# Patient Record
Sex: Female | Born: 1985 | Race: White | Hispanic: Yes | Marital: Single | Smoking: Never smoker
Health system: Southern US, Community
[De-identification: ages and names within clinical notes are randomized; demographics above are authoritative.]

## PROBLEM LIST (undated history)

## (undated) DIAGNOSIS — Z603 Acculturation difficulty: Secondary | ICD-10-CM

## (undated) DIAGNOSIS — E119 Type 2 diabetes mellitus without complications: Secondary | ICD-10-CM

## (undated) DIAGNOSIS — R0600 Dyspnea, unspecified: Secondary | ICD-10-CM

## (undated) DIAGNOSIS — R7303 Prediabetes: Secondary | ICD-10-CM

## (undated) DIAGNOSIS — F32A Depression, unspecified: Secondary | ICD-10-CM

## (undated) DIAGNOSIS — IMO0002 Reserved for concepts with insufficient information to code with codable children: Secondary | ICD-10-CM

## (undated) DIAGNOSIS — IMO0001 Reserved for inherently not codable concepts without codable children: Secondary | ICD-10-CM

## (undated) DIAGNOSIS — F329 Major depressive disorder, single episode, unspecified: Secondary | ICD-10-CM

## (undated) DIAGNOSIS — O1490 Unspecified pre-eclampsia, unspecified trimester: Secondary | ICD-10-CM

## (undated) DIAGNOSIS — E669 Obesity, unspecified: Secondary | ICD-10-CM

## (undated) DIAGNOSIS — K219 Gastro-esophageal reflux disease without esophagitis: Secondary | ICD-10-CM

## (undated) DIAGNOSIS — O2340 Unspecified infection of urinary tract in pregnancy, unspecified trimester: Secondary | ICD-10-CM

## (undated) DIAGNOSIS — R030 Elevated blood-pressure reading, without diagnosis of hypertension: Secondary | ICD-10-CM

## (undated) HISTORY — DX: Type 2 diabetes mellitus without complications: E11.9

## (undated) HISTORY — DX: Reserved for inherently not codable concepts without codable children: IMO0001

## (undated) HISTORY — PX: BRAIN BIOPSY: SHX905

## (undated) HISTORY — DX: Reserved for concepts with insufficient information to code with codable children: IMO0002

## (undated) HISTORY — DX: Major depressive disorder, single episode, unspecified: F32.9

## (undated) HISTORY — DX: Obesity, unspecified: E66.9

## (undated) HISTORY — DX: Acculturation difficulty: Z60.3

## (undated) HISTORY — DX: Elevated blood-pressure reading, without diagnosis of hypertension: R03.0

## (undated) HISTORY — DX: Prediabetes: R73.03

## (undated) HISTORY — DX: Depression, unspecified: F32.A

---
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
## 2007-03-03 ENCOUNTER — Emergency Department (HOSPITAL_COMMUNITY): Admission: EM | Admit: 2007-03-03 | Discharge: 2007-03-03 | Payer: Self-pay | Admitting: Emergency Medicine

## 2007-03-17 ENCOUNTER — Ambulatory Visit: Payer: Self-pay | Admitting: Obstetrics & Gynecology

## 2007-04-29 ENCOUNTER — Ambulatory Visit: Payer: Self-pay | Admitting: Obstetrics and Gynecology

## 2007-10-01 ENCOUNTER — Emergency Department (HOSPITAL_COMMUNITY): Admission: EM | Admit: 2007-10-01 | Discharge: 2007-10-01 | Payer: Self-pay | Admitting: Emergency Medicine

## 2009-07-10 ENCOUNTER — Inpatient Hospital Stay (HOSPITAL_COMMUNITY): Admission: AD | Admit: 2009-07-10 | Discharge: 2009-07-10 | Payer: Self-pay | Admitting: Obstetrics & Gynecology

## 2009-07-12 ENCOUNTER — Inpatient Hospital Stay (HOSPITAL_COMMUNITY): Admission: RE | Admit: 2009-07-12 | Discharge: 2009-07-12 | Payer: Self-pay | Admitting: Obstetrics & Gynecology

## 2009-08-20 ENCOUNTER — Inpatient Hospital Stay (HOSPITAL_COMMUNITY): Admission: AD | Admit: 2009-08-20 | Discharge: 2009-08-20 | Payer: Self-pay | Admitting: Obstetrics & Gynecology

## 2009-08-20 ENCOUNTER — Ambulatory Visit: Payer: Self-pay | Admitting: Obstetrics and Gynecology

## 2009-09-05 ENCOUNTER — Ambulatory Visit (HOSPITAL_COMMUNITY): Admission: RE | Admit: 2009-09-05 | Discharge: 2009-09-05 | Payer: Self-pay | Admitting: Family Medicine

## 2009-09-26 ENCOUNTER — Ambulatory Visit (HOSPITAL_COMMUNITY): Admission: RE | Admit: 2009-09-26 | Discharge: 2009-09-26 | Payer: Self-pay | Admitting: Family Medicine

## 2009-09-27 ENCOUNTER — Ambulatory Visit: Payer: Self-pay | Admitting: Advanced Practice Midwife

## 2009-09-27 ENCOUNTER — Inpatient Hospital Stay (HOSPITAL_COMMUNITY): Admission: AD | Admit: 2009-09-27 | Discharge: 2009-09-27 | Payer: Self-pay | Admitting: Obstetrics & Gynecology

## 2009-09-29 ENCOUNTER — Encounter: Payer: Self-pay | Admitting: Obstetrics & Gynecology

## 2009-09-29 LAB — CONVERTED CEMR LAB
Creatinine 24 HR UR: 849 mg/24hr (ref 700–1800)
Creatinine Clearance: 95 mL/min (ref 75–115)
Creatinine, Urine: 117.9 mg/dL
Protein, Ur: 526 mg/24hr — ABNORMAL HIGH (ref 50–100)

## 2009-10-02 ENCOUNTER — Encounter: Payer: Self-pay | Admitting: Family Medicine

## 2009-10-02 ENCOUNTER — Observation Stay (HOSPITAL_COMMUNITY): Admission: AD | Admit: 2009-10-02 | Discharge: 2009-10-03 | Payer: Self-pay | Admitting: Obstetrics & Gynecology

## 2009-10-04 ENCOUNTER — Ambulatory Visit: Payer: Self-pay | Admitting: Obstetrics & Gynecology

## 2009-10-04 ENCOUNTER — Encounter: Payer: Self-pay | Admitting: Obstetrics & Gynecology

## 2009-10-04 ENCOUNTER — Inpatient Hospital Stay (HOSPITAL_COMMUNITY): Admission: AD | Admit: 2009-10-04 | Discharge: 2009-10-12 | Payer: Self-pay | Admitting: Obstetrics & Gynecology

## 2009-10-04 ENCOUNTER — Encounter: Payer: Self-pay | Admitting: Family Medicine

## 2009-10-04 ENCOUNTER — Ambulatory Visit: Payer: Self-pay | Admitting: Surgery

## 2009-10-09 ENCOUNTER — Encounter: Payer: Self-pay | Admitting: Obstetrics & Gynecology

## 2009-10-17 ENCOUNTER — Ambulatory Visit: Payer: Self-pay | Admitting: Family

## 2009-10-17 ENCOUNTER — Inpatient Hospital Stay (HOSPITAL_COMMUNITY): Admission: AD | Admit: 2009-10-17 | Discharge: 2009-10-17 | Payer: Self-pay | Admitting: Obstetrics & Gynecology

## 2009-10-18 ENCOUNTER — Ambulatory Visit: Payer: Self-pay | Admitting: Family Medicine

## 2009-10-25 ENCOUNTER — Inpatient Hospital Stay (HOSPITAL_COMMUNITY): Admission: AD | Admit: 2009-10-25 | Discharge: 2009-10-26 | Payer: Self-pay | Admitting: Obstetrics & Gynecology

## 2009-10-31 ENCOUNTER — Inpatient Hospital Stay (HOSPITAL_COMMUNITY): Admission: AD | Admit: 2009-10-31 | Discharge: 2009-11-03 | Payer: Self-pay | Admitting: Obstetrics & Gynecology

## 2009-10-31 ENCOUNTER — Ambulatory Visit: Payer: Self-pay | Admitting: Obstetrics & Gynecology

## 2009-11-08 ENCOUNTER — Ambulatory Visit: Payer: Self-pay | Admitting: Obstetrics & Gynecology

## 2009-11-15 ENCOUNTER — Other Ambulatory Visit: Admission: RE | Admit: 2009-11-15 | Discharge: 2009-11-15 | Payer: Self-pay | Admitting: Obstetrics & Gynecology

## 2009-11-15 ENCOUNTER — Ambulatory Visit: Payer: Self-pay | Admitting: Family Medicine

## 2009-12-13 ENCOUNTER — Ambulatory Visit: Payer: Self-pay | Admitting: Obstetrics & Gynecology

## 2010-01-10 ENCOUNTER — Ambulatory Visit: Admission: RE | Admit: 2010-01-10 | Discharge: 2010-01-10 | Payer: Self-pay | Admitting: Gynecologic Oncology

## 2010-04-14 NOTE — L&D Delivery Note (Addendum)
Delivery Note At 11:37 AM a viable female was delivered via  (Presentation: right occiput anterior).  APGAR: 6, 9; weight 8 pounds 7 ounces .   Placenta status: spontaneous, intact.  Cord: 3 vessel cord  with the following complications: none .  Anesthesia: Epidural  Episiotomy: none Lacerations: none Est. Blood Loss (mL): 200 mL  Mom to postpartum.  Baby to nursery-stable.  Stacie Mitchell Stacie Mitchell 04/13/2011, 11:52 AM

## 2010-06-12 ENCOUNTER — Inpatient Hospital Stay (HOSPITAL_COMMUNITY)
Admission: AD | Admit: 2010-06-12 | Discharge: 2010-06-12 | Disposition: A | Discharge status: Home / Self Care | Payer: Self-pay | Source: Ambulatory Visit | Attending: Obstetrics & Gynecology | Admitting: Obstetrics & Gynecology

## 2010-06-12 DIAGNOSIS — B373 Candidiasis of vulva and vagina: Secondary | ICD-10-CM

## 2010-06-12 DIAGNOSIS — N949 Unspecified condition associated with female genital organs and menstrual cycle: Secondary | ICD-10-CM

## 2010-06-12 DIAGNOSIS — B3731 Acute candidiasis of vulva and vagina: Secondary | ICD-10-CM | POA: Insufficient documentation

## 2010-06-12 LAB — PREGNANCY, URINE: Preg Test, Ur: NEGATIVE

## 2010-06-12 LAB — WET PREP, GENITAL

## 2010-06-12 LAB — URINALYSIS, ROUTINE W REFLEX MICROSCOPIC
Hgb urine dipstick: NEGATIVE
Ketones, ur: NEGATIVE mg/dL
Nitrite: NEGATIVE
Protein, ur: NEGATIVE mg/dL
Urine Glucose, Fasting: NEGATIVE mg/dL

## 2010-06-12 LAB — URINE MICROSCOPIC-ADD ON

## 2010-06-13 LAB — GC/CHLAMYDIA PROBE AMP, GENITAL: Chlamydia, DNA Probe: NEGATIVE

## 2010-06-29 LAB — COMPREHENSIVE METABOLIC PANEL
Albumin: 3 g/dL — ABNORMAL LOW (ref 3.5–5.2)
Chloride: 105 mEq/L (ref 96–112)
GFR calc Af Amer: >60 mL/min (ref >60)
Glucose, Bld: 89 mg/dL (ref 70–99)
Potassium: 4.1 mEq/L (ref 3.5–5.1)
Sodium: 139 mEq/L (ref 135–145)
Total Protein: 6.8 g/dL (ref 6.0–8.3)

## 2010-06-29 LAB — CBC
HCT: 30.6 % — ABNORMAL LOW (ref 36.0–46.0)
MCH: 30.8 pg (ref 26.0–34.0)
MCHC: 34.1 g/dL (ref 30.0–36.0)
RDW: 14.1 % (ref 11.5–15.5)

## 2010-06-29 LAB — URINALYSIS, ROUTINE W REFLEX MICROSCOPIC
Glucose, UA: NEGATIVE mg/dL
Nitrite: NEGATIVE
Protein, ur: NEGATIVE mg/dL
Specific Gravity, Urine: >1.03 — ABNORMAL HIGH (ref 1.005–1.030)

## 2010-06-29 LAB — URINE MICROSCOPIC-ADD ON

## 2010-06-29 LAB — CREATININE, SERUM: GFR calc Af Amer: >60 mL/min (ref >60)

## 2010-06-30 LAB — COMPREHENSIVE METABOLIC PANEL
ALT: 26 U/L (ref 0–35)
ALT: 25 U/L (ref 0–35)
ALT: 25 U/L (ref 0–35)
ALT: 28 U/L (ref 0–35)
ALT: 29 U/L (ref 0–35)
ALT: 29 U/L (ref 0–35)
ALT: 32 U/L (ref 0–35)
ALT: 35 U/L (ref 0–35)
AST: 27 U/L (ref 0–37)
AST: 28 U/L (ref 0–37)
AST: 28 U/L (ref 0–37)
AST: 30 U/L (ref 0–37)
AST: 32 U/L (ref 0–37)
AST: 32 U/L (ref 0–37)
AST: 41 U/L — ABNORMAL HIGH (ref 0–37)
Albumin: 1.7 g/dL — ABNORMAL LOW (ref 3.5–5.2)
Albumin: 2.1 g/dL — ABNORMAL LOW (ref 3.5–5.2)
Albumin: 2.2 g/dL — ABNORMAL LOW (ref 3.5–5.2)
Albumin: 2.2 g/dL — ABNORMAL LOW (ref 3.5–5.2)
Albumin: 2.2 g/dL — ABNORMAL LOW (ref 3.5–5.2)
Albumin: 2.3 g/dL — ABNORMAL LOW (ref 3.5–5.2)
Albumin: 2.4 g/dL — ABNORMAL LOW (ref 3.5–5.2)
Albumin: 2.5 g/dL — ABNORMAL LOW (ref 3.5–5.2)
Alkaline Phosphatase: 60 U/L (ref 39–117)
Alkaline Phosphatase: 108 U/L (ref 39–117)
Alkaline Phosphatase: 110 U/L (ref 39–117)
Alkaline Phosphatase: 124 U/L — ABNORMAL HIGH (ref 39–117)
BUN: 10 mg/dL (ref 6–23)
BUN: 11 mg/dL (ref 6–23)
BUN: 12 mg/dL (ref 6–23)
BUN: 6 mg/dL (ref 6–23)
BUN: 8 mg/dL (ref 6–23)
BUN: 9 mg/dL (ref 6–23)
CO2: 25 mEq/L (ref 19–32)
CO2: 24 mEq/L (ref 19–32)
CO2: 24 mEq/L (ref 19–32)
CO2: 24 mEq/L (ref 19–32)
CO2: 25 mEq/L (ref 19–32)
CO2: 25 mEq/L (ref 19–32)
CO2: 25 mEq/L (ref 19–32)
CO2: 26 mEq/L (ref 19–32)
Calcium: 7.4 mg/dL — ABNORMAL LOW (ref 8.4–10.5)
Calcium: 7.4 mg/dL — ABNORMAL LOW (ref 8.4–10.5)
Calcium: 7.6 mg/dL — ABNORMAL LOW (ref 8.4–10.5)
Calcium: 7.9 mg/dL — ABNORMAL LOW (ref 8.4–10.5)
Calcium: 7.9 mg/dL — ABNORMAL LOW (ref 8.4–10.5)
Calcium: 8.5 mg/dL (ref 8.4–10.5)
Calcium: 8.8 mg/dL (ref 8.4–10.5)
Chloride: 107 mEq/L (ref 96–112)
Chloride: 104 mEq/L (ref 96–112)
Chloride: 105 mEq/L (ref 96–112)
Chloride: 105 mEq/L (ref 96–112)
Chloride: 106 mEq/L (ref 96–112)
Chloride: 108 mEq/L (ref 96–112)
Chloride: 109 mEq/L (ref 96–112)
Creatinine, Ser: 0.57 mg/dL (ref 0.4–1.2)
Creatinine, Ser: 0.61 mg/dL (ref 0.4–1.2)
Creatinine, Ser: 0.61 mg/dL (ref 0.4–1.2)
Creatinine, Ser: 0.62 mg/dL (ref 0.4–1.2)
Creatinine, Ser: 0.63 mg/dL (ref 0.4–1.2)
Creatinine, Ser: 0.7 mg/dL (ref 0.4–1.2)
Creatinine, Ser: 0.83 mg/dL (ref 0.4–1.2)
GFR calc Af Amer: >60 mL/min (ref >60)
GFR calc Af Amer: >60 mL/min (ref >60)
GFR calc Af Amer: >60 mL/min (ref >60)
GFR calc Af Amer: >60 mL/min (ref >60)
GFR calc Af Amer: >60 mL/min (ref >60)
GFR calc Af Amer: >60 mL/min (ref >60)
GFR calc Af Amer: >60 mL/min (ref >60)
GFR calc Af Amer: >60 mL/min (ref >60)
GFR calc non Af Amer: >60 mL/min (ref >60)
GFR calc non Af Amer: >60 mL/min (ref >60)
GFR calc non Af Amer: >60 mL/min (ref >60)
GFR calc non Af Amer: >60 mL/min (ref >60)
GFR calc non Af Amer: >60 mL/min (ref >60)
Glucose, Bld: 98 mg/dL (ref 70–99)
Glucose, Bld: 109 mg/dL — ABNORMAL HIGH (ref 70–99)
Glucose, Bld: 78 mg/dL (ref 70–99)
Glucose, Bld: 79 mg/dL (ref 70–99)
Glucose, Bld: 88 mg/dL (ref 70–99)
Potassium: 3.8 mEq/L (ref 3.5–5.1)
Potassium: 3.7 mEq/L (ref 3.5–5.1)
Potassium: 3.8 mEq/L (ref 3.5–5.1)
Potassium: 4 mEq/L (ref 3.5–5.1)
Potassium: 4 mEq/L (ref 3.5–5.1)
Potassium: 4.5 mEq/L (ref 3.5–5.1)
Sodium: 137 mEq/L (ref 135–145)
Sodium: 133 mEq/L — ABNORMAL LOW (ref 135–145)
Sodium: 133 mEq/L — ABNORMAL LOW (ref 135–145)
Sodium: 135 mEq/L (ref 135–145)
Sodium: 135 mEq/L (ref 135–145)
Sodium: 138 mEq/L (ref 135–145)
Total Bilirubin: 0.2 mg/dL — ABNORMAL LOW (ref 0.3–1.2)
Total Bilirubin: 0.3 mg/dL (ref 0.3–1.2)
Total Bilirubin: 0.3 mg/dL (ref 0.3–1.2)
Total Bilirubin: 0.3 mg/dL (ref 0.3–1.2)
Total Protein: 5.6 g/dL — ABNORMAL LOW (ref 6.0–8.3)
Total Protein: 4.3 g/dL — ABNORMAL LOW (ref 6.0–8.3)
Total Protein: 5 g/dL — ABNORMAL LOW (ref 6.0–8.3)
Total Protein: 5.2 g/dL — ABNORMAL LOW (ref 6.0–8.3)
Total Protein: 5.2 g/dL — ABNORMAL LOW (ref 6.0–8.3)
Total Protein: 5.4 g/dL — ABNORMAL LOW (ref 6.0–8.3)

## 2010-06-30 LAB — CBC
HCT: 23 % — ABNORMAL LOW (ref 36.0–46.0)
HCT: 24.1 % — ABNORMAL LOW (ref 36.0–46.0)
HCT: 36.8 % (ref 36.0–46.0)
HCT: 37.7 % (ref 36.0–46.0)
HCT: 39.1 % (ref 36.0–46.0)
Hemoglobin: 7.8 g/dL — ABNORMAL LOW (ref 12.0–15.0)
Hemoglobin: 8.4 g/dL — ABNORMAL LOW (ref 12.0–15.0)
Hemoglobin: 10.5 g/dL — ABNORMAL LOW (ref 12.0–15.0)
Hemoglobin: 12.6 g/dL (ref 12.0–15.0)
Hemoglobin: 12.6 g/dL (ref 12.0–15.0)
Hemoglobin: 13.4 g/dL (ref 12.0–15.0)
Hemoglobin: 13.6 g/dL (ref 12.0–15.0)
Hemoglobin: 13.6 g/dL (ref 12.0–15.0)
Hemoglobin: 9.7 g/dL — ABNORMAL LOW (ref 12.0–15.0)
MCH: 33 pg (ref 26.0–34.0)
MCH: 32 pg (ref 26.0–34.0)
MCH: 32 pg (ref 26.0–34.0)
MCH: 32.1 pg (ref 26.0–34.0)
MCH: 32.1 pg (ref 26.0–34.0)
MCH: 32.1 pg (ref 26.0–34.0)
MCH: 32.2 pg (ref 26.0–34.0)
MCH: 32.2 pg (ref 26.0–34.0)
MCH: 33.6 pg (ref 26.0–34.0)
MCH: 34 pg (ref 26.0–34.0)
MCHC: 34.9 g/dL (ref 30.0–36.0)
MCHC: 34.1 g/dL (ref 30.0–36.0)
MCHC: 34.1 g/dL (ref 30.0–36.0)
MCHC: 34.2 g/dL (ref 30.0–36.0)
MCHC: 34.2 g/dL (ref 30.0–36.0)
MCHC: 34.3 g/dL (ref 30.0–36.0)
MCHC: 34.7 g/dL (ref 30.0–36.0)
MCHC: 34.7 g/dL (ref 30.0–36.0)
MCHC: 35.1 g/dL (ref 30.0–36.0)
MCHC: 35.8 g/dL (ref 30.0–36.0)
MCV: 93.2 fL (ref 78.0–100.0)
MCV: 93.3 fL (ref 78.0–100.0)
MCV: 93.4 fL (ref 78.0–100.0)
MCV: 93.5 fL (ref 78.0–100.0)
MCV: 93.9 fL (ref 78.0–100.0)
MCV: 94.1 fL (ref 78.0–100.0)
MCV: 94.1 fL (ref 78.0–100.0)
MCV: 94.2 fL (ref 78.0–100.0)
Platelets: 183 10*3/uL (ref 150–400)
Platelets: 192 10*3/uL (ref 150–400)
Platelets: 194 10*3/uL (ref 150–400)
Platelets: 197 10*3/uL (ref 150–400)
Platelets: 198 10*3/uL (ref 150–400)
Platelets: 207 10*3/uL (ref 150–400)
Platelets: 209 10*3/uL (ref 150–400)
RBC: 2.48 MIL/uL — ABNORMAL LOW (ref 3.87–5.11)
RBC: 2.85 MIL/uL — ABNORMAL LOW (ref 3.87–5.11)
RBC: 3.26 MIL/uL — ABNORMAL LOW (ref 3.87–5.11)
RBC: 3.93 MIL/uL (ref 3.87–5.11)
RBC: 3.99 MIL/uL (ref 3.87–5.11)
RBC: 4.19 MIL/uL (ref 3.87–5.11)
RBC: 4.23 MIL/uL (ref 3.87–5.11)
RBC: 4.28 MIL/uL (ref 3.87–5.11)
RDW: 13.3 % (ref 11.5–15.5)
RDW: 13.6 % (ref 11.5–15.5)
RDW: 13.6 % (ref 11.5–15.5)
RDW: 13.7 % (ref 11.5–15.5)
RDW: 13.9 % (ref 11.5–15.5)
RDW: 13.9 % (ref 11.5–15.5)
RDW: 13.9 % (ref 11.5–15.5)
WBC: 10.8 10*3/uL — ABNORMAL HIGH (ref 4.0–10.5)
WBC: 10.3 10*3/uL (ref 4.0–10.5)
WBC: 7.3 10*3/uL (ref 4.0–10.5)
WBC: 9.7 10*3/uL (ref 4.0–10.5)
WBC: 9.8 10*3/uL (ref 4.0–10.5)
WBC: 9.8 10*3/uL (ref 4.0–10.5)

## 2010-06-30 LAB — URINALYSIS, ROUTINE W REFLEX MICROSCOPIC
Bilirubin Urine: NEGATIVE
Bilirubin Urine: NEGATIVE
Glucose, UA: NEGATIVE mg/dL
Ketones, ur: NEGATIVE mg/dL
Nitrite: NEGATIVE
Protein, ur: NEGATIVE mg/dL
Specific Gravity, Urine: <1.005 — ABNORMAL LOW (ref 1.005–1.030)
Specific Gravity, Urine: >1.03 — ABNORMAL HIGH (ref 1.005–1.030)
Urobilinogen, UA: 0.2 mg/dL (ref 0.0–1.0)
Urobilinogen, UA: 0.2 mg/dL (ref 0.0–1.0)
pH: 6 (ref 5.0–8.0)

## 2010-06-30 LAB — DIFFERENTIAL
Basophils Relative: 0 % (ref 0–1)
Eosinophils Absolute: 0 10*3/uL (ref 0.0–0.7)
Monocytes Absolute: 1 10*3/uL (ref 0.1–1.0)
Monocytes Relative: 5 % (ref 3–12)
Neutro Abs: 18.6 10*3/uL — ABNORMAL HIGH (ref 1.7–7.7)

## 2010-06-30 LAB — CREATININE CLEARANCE, URINE, 24 HOUR
Collection Interval-CRCL: 24 hours
Creatinine Clearance: 206 mL/min — ABNORMAL HIGH (ref 75–115)
Creatinine, Urine: 54.3 mg/dL
Creatinine, Urine: 93.7 mg/dL
Creatinine: 0.52 mg/dL (ref 0.4–1.2)

## 2010-06-30 LAB — PROTEIN, URINE, 24 HOUR
Collection Interval-UPROT: 24 hours
Protein, 24H Urine: 2795 mg/d — ABNORMAL HIGH (ref 50–100)
Protein, 24H Urine: 3540 mg/d — ABNORMAL HIGH (ref 50–100)
Protein, Urine: 81 mg/dL
Urine Total Volume-UPROT: 3450 mL

## 2010-06-30 LAB — PROTEIN / CREATININE RATIO, URINE
Protein Creatinine Ratio: 0.95 — ABNORMAL HIGH (ref 0.00–0.15)
Total Protein, Urine: 38 mg/dL

## 2010-06-30 LAB — POCT URINALYSIS DIP (DEVICE)
Glucose, UA: NEGATIVE mg/dL
Nitrite: NEGATIVE
Protein, ur: >=300 mg/dL — AB
Urobilinogen, UA: 1 mg/dL (ref 0.0–1.0)

## 2010-06-30 LAB — URINE MICROSCOPIC-ADD ON

## 2010-06-30 LAB — URIC ACID: Uric Acid, Serum: 5.1 mg/dL (ref 2.4–7.0)

## 2010-06-30 LAB — PROTIME-INR: INR: 1.12 (ref 0.00–1.49)

## 2010-06-30 LAB — MAGNESIUM
Magnesium: 4.3 mg/dL — ABNORMAL HIGH (ref 1.5–2.5)
Magnesium: 4.4 mg/dL — ABNORMAL HIGH (ref 1.5–2.5)

## 2010-07-02 LAB — URINALYSIS, ROUTINE W REFLEX MICROSCOPIC
Nitrite: NEGATIVE
Protein, ur: NEGATIVE mg/dL
Urobilinogen, UA: 0.2 mg/dL (ref 0.0–1.0)

## 2010-07-02 LAB — WET PREP, GENITAL: Trich, Wet Prep: NONE SEEN

## 2010-07-02 LAB — CBC
Hemoglobin: 12.3 g/dL (ref 12.0–15.0)
MCHC: 34.4 g/dL (ref 30.0–36.0)
MCV: 91.6 fL (ref 78.0–100.0)
RBC: 3.89 MIL/uL (ref 3.87–5.11)

## 2010-07-02 LAB — RAPID URINE DRUG SCREEN, HOSP PERFORMED: Barbiturates: NOT DETECTED

## 2010-07-02 LAB — GC/CHLAMYDIA PROBE AMP, GENITAL
Chlamydia, DNA Probe: NEGATIVE
GC Probe Amp, Genital: NEGATIVE

## 2010-07-02 LAB — ABO/RH: ABO/RH(D): AB POS

## 2010-07-02 LAB — RAPID HIV SCREEN (WH-MAU): Rapid HIV Screen: NONREACTIVE

## 2010-07-08 LAB — POCT PREGNANCY, URINE: Preg Test, Ur: POSITIVE

## 2010-08-27 NOTE — Group Therapy Note (Signed)
NAME:  KYAN, GIANNONE NO.:  [id number]   MEDICAL RECORD NO.:  [id number]          PATIENT TYPE:  WOC   LOCATION:  [facility]                   FACILITY:  [facility]   PHYSICIAN:  Argentina Donovan, MD        DATE OF BIRTH:  [birth year]   DATE OF SERVICE:  04/29/2007                                  CLINIC NOTE   REASON FOR VISIT:  The patient is a 25 year old Hispanic Spanish-  speaking female, nulligravida, with morbid obesity i.e., weight 342  pounds at 5 feet 3 inches tall. Returns today for a follow-up after  having seen Dr. Marice Potter and Dr. Penne Lash and noted to have for a suburethral  mass. She was placed on antibiotics and today there was no sign of a  mass when we examined her. However, she does definitely have bacterial  vaginosis for which we will put her on Flagyl. In addition to that she  complained of a small lump on the mons on examination. This was a small  sebaceous cyst. The patient stated that her period was very scant this  month and is usually heavy. We told her to wait, that if she starts  having irregular periods that  she should come and see [place]. But at this  point they been very regular. She does however have some signs of  hirsutism on the lower abdomen and on the face.   IMPRESSION:  Bacterial vaginosis, possible polycystic ovarian syndrome.           ______________________________  Argentina Donovan, MD     PR/MEDQ  D:  04/29/2007  T:  04/29/2007  Job:  [id number]

## 2010-08-27 NOTE — Group Therapy Note (Signed)
NAME:  Stacie Mitchell, Stacie Mitchell NO.:  [id number]   MEDICAL RECORD NO.:  [id number]          PATIENT TYPE:  WOC   LOCATION:  [facility]                   FACILITY:  [facility]   PHYSICIAN:  Dorthula Perfect, MD     DATE OF BIRTH:  06/18/1985   DATE OF SERVICE:                                  CLINIC NOTE   This 25 year old Hispanic female, nulligravida, returns for followup  after having a vaginal hematoma drained in the emergency room.  Review  of the hospital note does not show me any specifics about the size and  location of his hematoma.  The patient states that it is a little better  but is still very tender.   Last menstrual period was started about six days ago.  She does nothing  for contraception.  She has never had a Pap smear.   PHYSICAL EXAMINATION:  Height 5 feet 3.  Weight 342 pounds.  Blood  pressure 115/79.  Exam is limited to external genitalia.  Examination of external  genitalia reveals the vulva to basically be normal.  Adjacent to the  left side of the urethra is a soft enlargement measuring about 2 cm x  1.5 cm.  It is not firm, tent, or fluctuant.  It is tender.  I wonder if  it is related to the urethra.   Since she has never had a Pap smear, I used the Patterson speculum but  was unable to reach the cervix because of redundancy in the vaginal  walls and the fact that trying to use a larger speculum was  uncomfortable for her because of this cystic-like structure.  I had Dr.  Marice Potter look at it also.  We both wonder if it is related to the urethra,  i.e., a urethral diverticulum, but it is not in the midline but is on  the right side.   ASSESSMENT:  Paraurethral cyst-like structure.   DISPOSITION:  Keflex 500 mg 4 times a day for 10 days.  She will return  in about four weeks to see Dr. Marice Potter.  If this is infectious, perhaps the  antibiotic will help.  If not, consideration will be whether she needs  to be checked by a urologist or whether this area needs  to be surgically  explored.           ______________________________  Dorthula Perfect, MD     ER/MEDQ  D:  03/17/2007  T:  03/17/2007  Job:  [id number]

## 2010-08-27 NOTE — Assessment & Plan Note (Signed)
**Note Stacie-Identified via Obfuscation** NAME:  Stacie Mitchell, Stacie Mitchell            ACCOUNT NO.:  [id number]   MEDICAL RECORD NO.:  [id number]          PATIENT TYPE:  WOC   LOCATION:  [facility]         FACILITY:  [facility]   PHYSICIAN:  Caren Griffins, CNM       DATE OF BIRTH:  [birth year]   DATE OF SERVICE:  11/15/2009                                  CLINIC NOTE   This is a G1, P1-0-0-1 who had a C-section on October 09, 2009 by Dr.  Penne Lash at 36-6/7 weeks.  She had complication of preeclampsia,  prolonged rupture of membranes, and morbid obesity 385 pounds.  She was  last seen here by Dr. Okey Dupre on November 08, 2009, for check of her wound  cellulitis.  He had previously seen her and felt that she was improving  and her wound vac was out and she did have much less induration then at  previous visit when he evaluated her.  She is still taking Bactrim  finishing a 10-day course and using silver sulfadiazine cream b.i.d. to  the small area where the skin is open.  Of note, she was admitted July  2011, for 3 days and received IV vancomycin for her postop wound  infection.  She wants to have her postpartum check today.  However,  after this was in progress, she is deciding that she would like to have  Implanon which is done at the health department.  She has not been  sexually active and does not have a sexual partner at this time.  Her  infant is thriving.  She weaned him the first week and is now bottle  feeding.  She had complained of dizziness at her last visit with Dr.  Okey Dupre and he was planning to refer her to see an internist.  However, she  tells me now that she has had no more dizziness and that she related it  which is going without eating for long periods of time.  She did that  for her postnatal depression scale and is not significantly depressed.  She states she has help with the baby.  She is having no bleeding and  has a thin yellow discharge and she denies any pain.  Of note, she had a  Pap smear that was negative in June  2011, so she is not due for her Pap.  For complete history, please see the note that was just done by Dr.  Debroah Loop on July 2011.  She has had no changes to her history other than  as stated above.   OBJECTIVE:  GENERAL:  Obese WF in NAD.  VITAL SIGNS:  Temperature 98.8, BP 103/64.  Her weight is not repeated,  it was 323 at her last visit on November 08, 2009.  Thyroid smooth.  No  enlargement noted.  Coronary RRR without murmur.  LUNGS:  CTA bilateral.  BREASTS:  No discrete masses.  Nipples erect.  No drainage.  No  lymphadenopathy.  ABDOMEN:  Morbidly obese, apron wound dressing over Pfannenstiel  incision is removed.  The incision is well-healed.  There is a 1.5-cm  open cutaneous round area centrally.  This is cleansed and there is  actually no  drainage.  No bleeding and no skin redness; however, she  does still have significant induration superior to the wound extending 5-  10 cm centrally.  She feels this is much improved.  EXTREMITIES:  Moderate dependent edema.  Pulse is full and equal  bilaterally.   PROCEDURE NOTE:  The patient states that she had a lesion that she was  told was a condylomata wart near her introitus and she would like to  have this removed.  She states that it irritates her and itches and  burns at times.   OBJECTIVE:  There is indeed about a 4-mm fleshy pedunculated lesion  which appears to be a nevus at this inferior left labium majora.  In  consultation with Dr. Jolayne Panther, the area is cleansed with Betadine under  sterile technique.  The tiny peduncle is not at off with 3'o suture very  tightly.  After that, the lesion is grasped and clipped at the stalk.  This is sent to Pathology.  Silver nitrate stick used to control  bleeding at site which was very small amount.  The patient tolerated the  procedure very well.  Following this, a bimanual is done.  Cervix is  closed.  There is physiologic discharge and no bleeding.  Unable to  outline the contour or size  of the uterus due to body habitus.   ASSESSMENT:  1. Six-week postop check.  Wound cellulitis is much improved and she      is advised to keep the small open portion clean and continue either      a Band-Aid or gauge apron dressing to put between that in her      pannus.  She is to complete her course of Bactrim.  2. Morbid obesity.  Discussed the need for weight loss and its effect      on her general health status.  She does not seem motivated to diet      at this time.  She is given a referral to [facility] to get Implanon.  In the      meanwhile, she is advised to continue abstinence or use condoms.           ______________________________  Caren Griffins, CNM     DP/MEDQ  D:  11/15/2009  T:  11/16/2009  Job:  [id number]

## 2010-09-26 ENCOUNTER — Inpatient Hospital Stay (HOSPITAL_COMMUNITY): Payer: Self-pay

## 2010-09-26 ENCOUNTER — Inpatient Hospital Stay (HOSPITAL_COMMUNITY)
Admission: AD | Admit: 2010-09-26 | Discharge: 2010-09-26 | Disposition: A | Discharge status: Home / Self Care | Payer: Self-pay | Source: Ambulatory Visit | Attending: Obstetrics & Gynecology | Admitting: Obstetrics & Gynecology

## 2010-09-26 DIAGNOSIS — R109 Unspecified abdominal pain: Secondary | ICD-10-CM | POA: Insufficient documentation

## 2010-09-26 DIAGNOSIS — Y92009 Unspecified place in unspecified non-institutional (private) residence as the place of occurrence of the external cause: Secondary | ICD-10-CM | POA: Insufficient documentation

## 2010-09-26 DIAGNOSIS — O9989 Other specified diseases and conditions complicating pregnancy, childbirth and the puerperium: Secondary | ICD-10-CM

## 2010-09-26 DIAGNOSIS — O99891 Other specified diseases and conditions complicating pregnancy: Secondary | ICD-10-CM | POA: Insufficient documentation

## 2010-09-26 DIAGNOSIS — W010XXA Fall on same level from slipping, tripping and stumbling without subsequent striking against object, initial encounter: Secondary | ICD-10-CM | POA: Insufficient documentation

## 2010-09-26 LAB — URINALYSIS, ROUTINE W REFLEX MICROSCOPIC
Hgb urine dipstick: NEGATIVE
Leukocytes, UA: NEGATIVE
Nitrite: NEGATIVE
Protein, ur: NEGATIVE mg/dL
Specific Gravity, Urine: >1.03 — ABNORMAL HIGH (ref 1.005–1.030)
Urobilinogen, UA: 0.2 mg/dL (ref 0.0–1.0)

## 2010-09-26 LAB — WET PREP, GENITAL
Clue Cells Wet Prep HPF POC: NONE SEEN
Trich, Wet Prep: NONE SEEN

## 2010-09-26 LAB — CBC
HCT: 35.8 % — ABNORMAL LOW (ref 36.0–46.0)
MCHC: 34.9 g/dL (ref 30.0–36.0)
Platelets: 245 10*3/uL (ref 150–400)
RDW: 13.3 % (ref 11.5–15.5)
WBC: 9.2 10*3/uL (ref 4.0–10.5)

## 2010-09-26 LAB — DIFFERENTIAL
Basophils Absolute: 0 10*3/uL (ref 0.0–0.1)
Basophils Relative: 0 % (ref 0–1)
Eosinophils Relative: 1 % (ref 0–5)
Lymphocytes Relative: 24 % (ref 12–46)

## 2010-11-30 ENCOUNTER — Inpatient Hospital Stay (HOSPITAL_COMMUNITY)
Admission: AD | Admit: 2010-11-30 | Discharge: 2010-11-30 | Disposition: A | Discharge status: Home / Self Care | Payer: Self-pay | Source: Ambulatory Visit | Attending: Obstetrics & Gynecology | Admitting: Obstetrics & Gynecology

## 2010-11-30 ENCOUNTER — Encounter (HOSPITAL_COMMUNITY): Payer: Self-pay

## 2010-11-30 DIAGNOSIS — R109 Unspecified abdominal pain: Secondary | ICD-10-CM | POA: Insufficient documentation

## 2010-11-30 DIAGNOSIS — O99891 Other specified diseases and conditions complicating pregnancy: Secondary | ICD-10-CM | POA: Insufficient documentation

## 2010-11-30 HISTORY — DX: Unspecified pre-eclampsia, unspecified trimester: O14.90

## 2010-11-30 LAB — CBC
HCT: 35.8 % — ABNORMAL LOW (ref 36.0–46.0)
MCHC: 34.6 g/dL (ref 30.0–36.0)
Platelets: 250 10*3/uL (ref 150–400)
RDW: 13.5 % (ref 11.5–15.5)
WBC: 11.4 10*3/uL — ABNORMAL HIGH (ref 4.0–10.5)

## 2010-11-30 LAB — URINALYSIS, ROUTINE W REFLEX MICROSCOPIC
Ketones, ur: 15 mg/dL — AB
Leukocytes, UA: NEGATIVE
Nitrite: NEGATIVE
Protein, ur: NEGATIVE mg/dL
Urobilinogen, UA: 1 mg/dL (ref 0.0–1.0)

## 2010-11-30 LAB — WET PREP, GENITAL: Trich, Wet Prep: NONE SEEN

## 2010-11-30 MED ORDER — METRONIDAZOLE 500 MG PO TABS: 500.0000 mg | ORAL_TABLET | Freq: Two times a day (BID) | ORAL | Status: AC

## 2010-11-30 MED ORDER — CYCLOBENZAPRINE HCL 10 MG PO TABS: 10.0000 mg | ORAL_TABLET | Freq: Three times a day (TID) | ORAL | Status: DC | PRN

## 2010-11-30 MED ORDER — ACETAMINOPHEN 325 MG PO TABS: 650.0000 mg | ORAL_TABLET | Freq: Four times a day (QID) | ORAL | Status: DC | PRN

## 2010-11-30 NOTE — ED Provider Notes (Signed)
Chief Complaint:  Abdominal Pain and Vaginal Bleeding   Stacie Mitchell is  25 y.o. G2P1001 ar 22wks, [facility] by the health department who presents with 2 days of abdominal and back pain.  Pt reports lifting heavy objects yesterday and began to have sharp pain in her abdomen around her umbilicus and pelvic area and her low back.  She also notes some scant bleeding that has since resolved. She reports +FM, no contractions.  Notes some vaginal discharge    Obstetrical/Gynecological History: OB History    Grav Para Term Preterm Abortions TAB SAB Ect Mult Living   2 1 1       1       Past Medical History: Past Medical History  Diagnosis Date  . Preeclampsia     Past Surgical History: Past Surgical History  Procedure Date  . 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    Family History: No family history on file.  Social History: History  Substance Use Topics  . Smoking status: Never Smoker   . Smokeless tobacco: Not on file  . Alcohol Use: No    Allergies: No Known Allergies  Prescriptions prior to admission  Medication Sig Dispense Refill  . Prenatal Vit-Fe Fumarate-FA (PRENATAL PLUS) 65-1 MG TABS Take 2 tablets by mouth daily.          Review of Systems - Negative except listed above in HPI  Physical Exam   Blood pressure 112/53, pulse 101, temperature 97.5 F (36.4 C), temperature source Oral, resp. rate 18, height 5\' 1"  (1.549 m), weight 331 lb 8 oz (150.367 kg), SpO2 96.00%.  General: General appearance - alert, well appearing, and in no distress Abdomen - morbidly obese, TTP around umbilicus, soft +BS, unable to assess fundal height Pelvic - SSE showed clear-white discharge, no bleeding visualized, cervix closed/thick /high Extremities - peripheral pulses normal, no pedal edema, no clubbing or cyanosis Skin - normal coloration and turgor, no rashes, no suspicious skin lesions noted  ]Bedside ultrasound showed +FM, with adequate  FHT  Labs: Recent Results (from the past 24 hour(s))  URINALYSIS, ROUTINE W REFLEX MICROSCOPIC   Collection Time   11/30/10  3:45 AM      Component Value Range   Color, Urine YELLOW  YELLOW    Appearance CLEAR  CLEAR    Specific Gravity, Urine >1.030 (*) 1.005 - 1.030    pH 5.5  5.0 - 8.0    Glucose, UA NEGATIVE  NEGATIVE (mg/dL)   Hgb urine dipstick NEGATIVE  NEGATIVE    Bilirubin Urine NEGATIVE  NEGATIVE    Ketones, ur 15 (*) NEGATIVE (mg/dL)   Protein, ur NEGATIVE  NEGATIVE (mg/dL)   Urobilinogen, UA 1.0  0.0 - 1.0 (mg/dL)   Nitrite NEGATIVE  NEGATIVE    Leukocytes, UA NEGATIVE  NEGATIVE   CBC   Collection Time   11/30/10  4:14 AM      Component Value Range   WBC 11.4 (*) 4.0 - 10.5 (K/uL)   RBC 4.02  3.87 - 5.11 (MIL/uL)   Hemoglobin 12.4  12.0 - 15.0 (g/dL)   HCT 16.1 (*) 09.6 - 46.0 (%)   MCV 89.1  78.0 - 100.0 (fL)   MCH 30.8  26.0 - 34.0 (pg)   MCHC 34.6  30.0 - 36.0 (g/dL)   RDW 04.5  40.9 - 81.1 (%)   Platelets 250  150 - 400 (K/uL)   Imaging Studies:  No results found.  Assessment/Plan: 25yo G2P1001 with abdominal pain -Check wet prep and GC/chlamydia, no signs of active bleeding, CBC wnl.  Pain consistent with MSK etiology.  Patient may take tylenol for pain and follow up with HD.   -UA showing signs of dehydration. Encouraged pt to push PO fluids, may be contributing to abdominal cramping  -wet prep showed clue cells, will start patient on 7d of flagyl 500mg  BID  Lindaann Slough MD  I have discussed this case with [name] CNM is in agreement with this plan.

## 2010-11-30 NOTE — Progress Notes (Signed)
Patient presents with c/o vaginal spotting that started yesterday and started having lower abdominal pain after lifting a heavy box. She states that the pain is now radiating to her back.

## 2010-12-02 LAB — GC/CHLAMYDIA PROBE AMP, GENITAL: Chlamydia, DNA Probe: NEGATIVE

## 2010-12-06 IMAGING — US US OB FOLLOW-UP
2 series · 14 of 28 positions shown · non-contrast
Comparison: none

OBSTETRICAL ULTRASOUND:
 This ultrasound exam was performed in the [HOSPITAL] Ultrasound Department.  The OB US report was generated in the AS system, and faxed to the ordering physician.  This report is also available in [HOSPITAL]?s [employer] and in [REDACTED] PACS.

[Series 1: us ob follow up · 2 of 7 slices shown (1 of 2)]
[im 3/7]
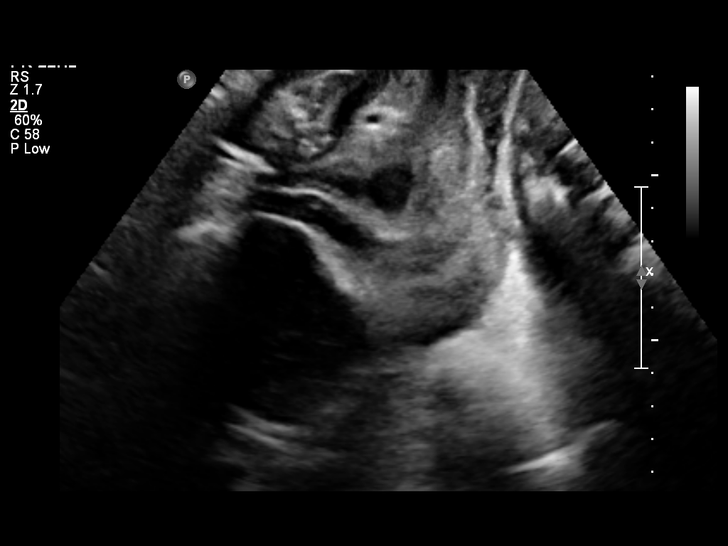
[im 7/7]
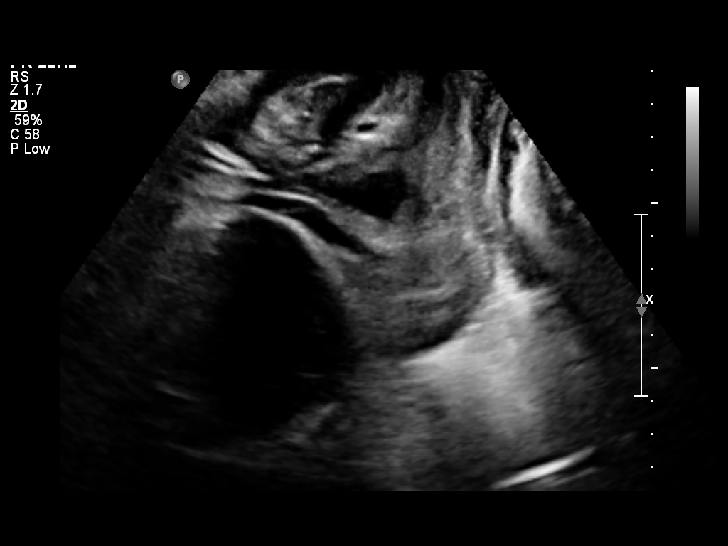

[Series 1: us ob follow up · 44 acquisitions, 12 frames shown (2 of 2)]
[im 2/44]
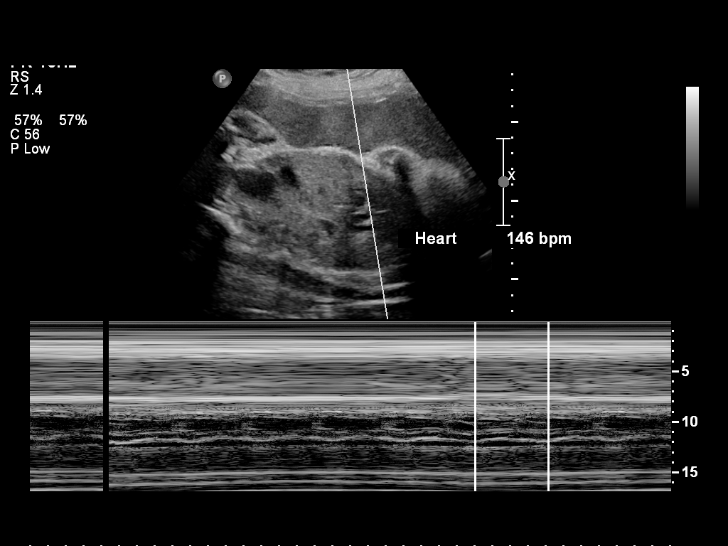
[im 6/44]
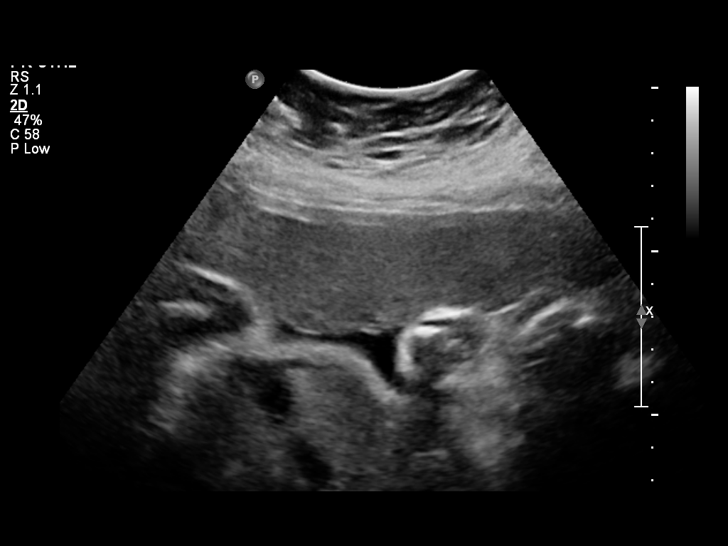
[im 10/44]
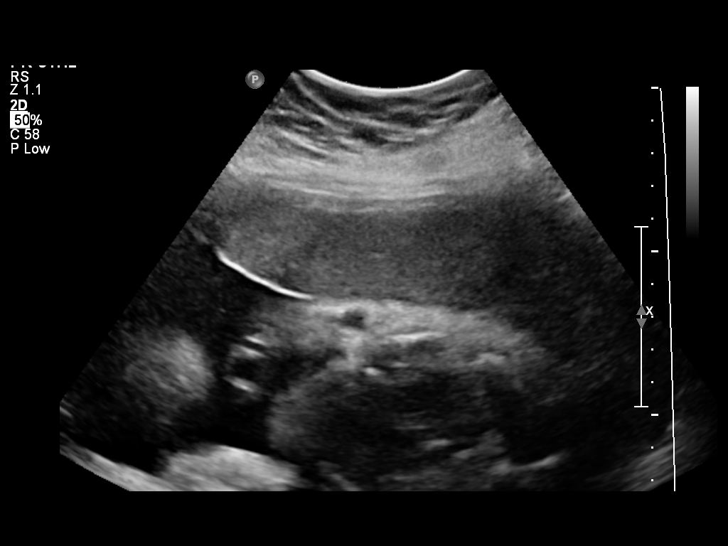
[im 14/44]
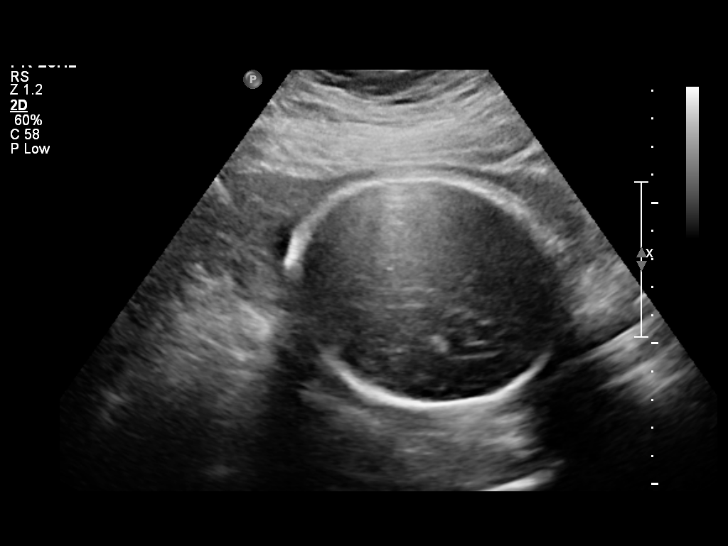
[im 17/44]
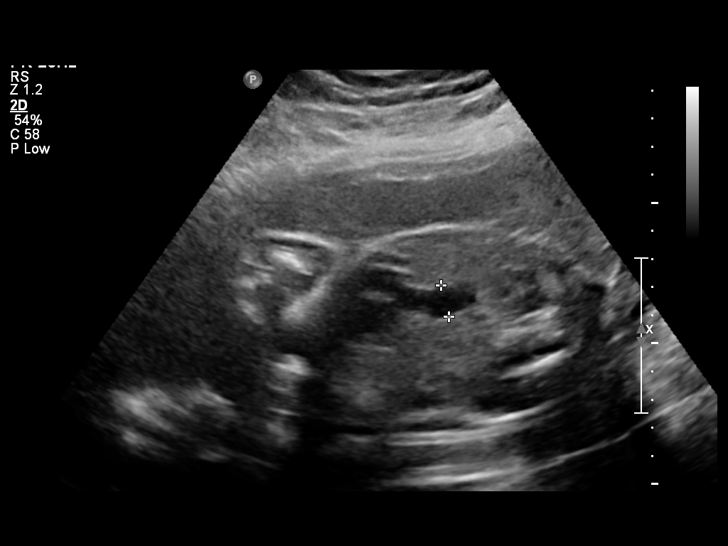
[im 21/44]
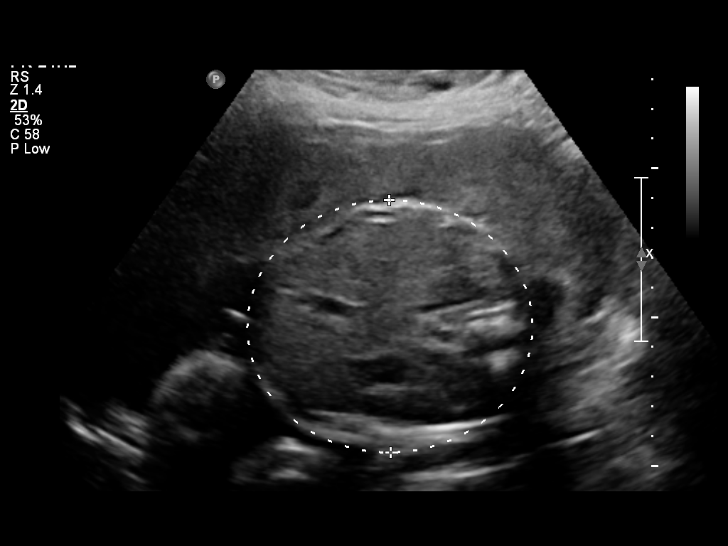
[im 25/44]
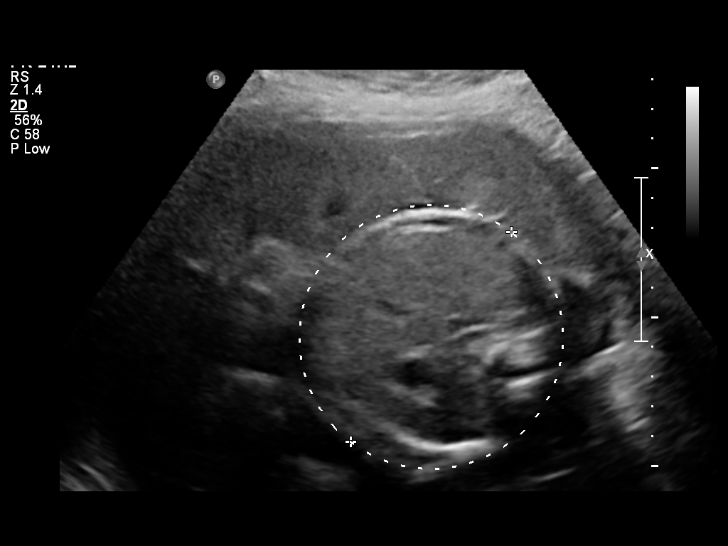
[im 29/44]
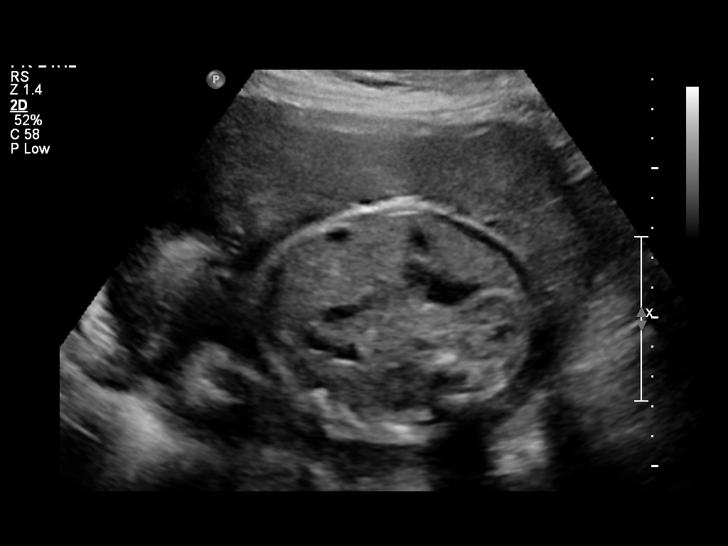
[im 32/44]
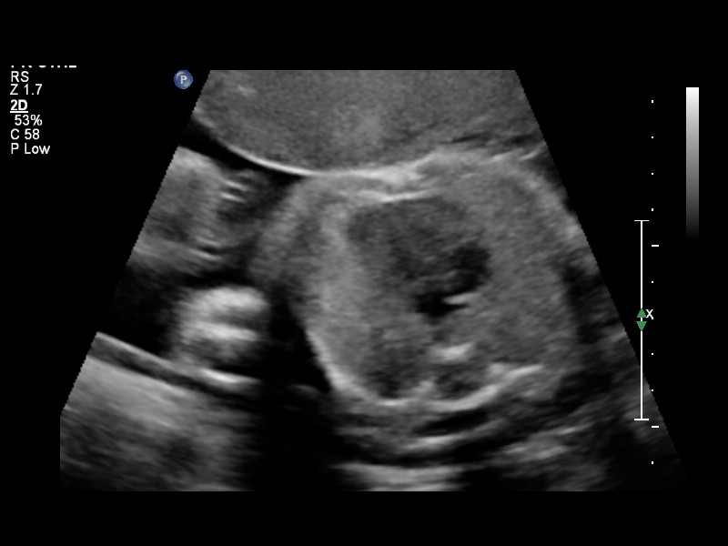
[im 36/44]
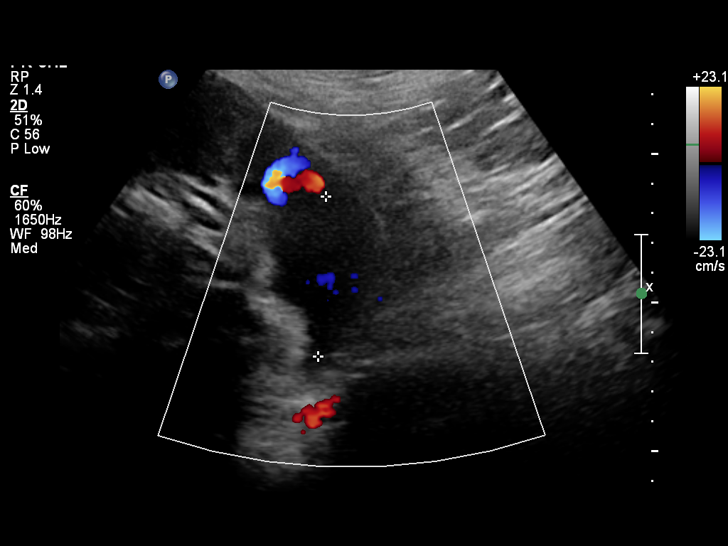
[im 40/44]
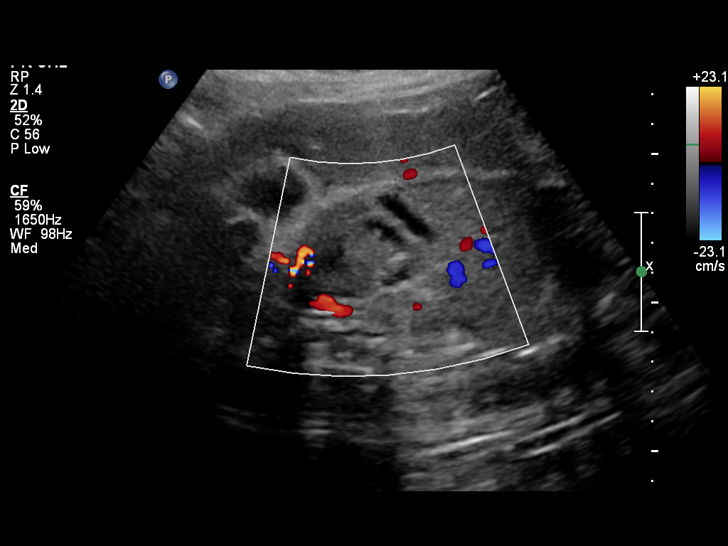
[im 44/44]
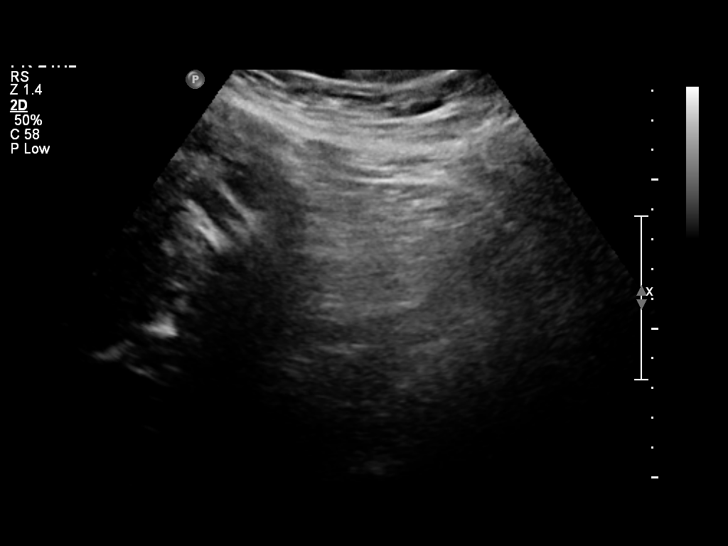

[14 of 28 positions shown; findings below may reference images not displayed]

IMPRESSION: See AS Obstetric US report.

## 2010-12-31 ENCOUNTER — Encounter (HOSPITAL_COMMUNITY): Payer: Self-pay | Admitting: *Deleted

## 2010-12-31 ENCOUNTER — Inpatient Hospital Stay (HOSPITAL_COMMUNITY)
Admission: AD | Admit: 2010-12-31 | Discharge: 2010-12-31 | Disposition: A | Discharge status: Home / Self Care | Payer: Self-pay | Source: Ambulatory Visit | Attending: Obstetrics & Gynecology | Admitting: Obstetrics & Gynecology

## 2010-12-31 DIAGNOSIS — O99891 Other specified diseases and conditions complicating pregnancy: Secondary | ICD-10-CM | POA: Insufficient documentation

## 2010-12-31 DIAGNOSIS — M79609 Pain in unspecified limb: Secondary | ICD-10-CM | POA: Insufficient documentation

## 2010-12-31 DIAGNOSIS — O0930 Supervision of pregnancy with insufficient antenatal care, unspecified trimester: Secondary | ICD-10-CM | POA: Insufficient documentation

## 2010-12-31 DIAGNOSIS — O479 False labor, unspecified: Secondary | ICD-10-CM

## 2010-12-31 DIAGNOSIS — N949 Unspecified condition associated with female genital organs and menstrual cycle: Secondary | ICD-10-CM | POA: Insufficient documentation

## 2010-12-31 LAB — URINALYSIS, ROUTINE W REFLEX MICROSCOPIC
Glucose, UA: NEGATIVE mg/dL
Leukocytes, UA: NEGATIVE
Protein, ur: NEGATIVE mg/dL
Specific Gravity, Urine: >1.03 — ABNORMAL HIGH (ref 1.005–1.030)
pH: 6 (ref 5.0–8.0)

## 2010-12-31 LAB — GLUCOSE, CAPILLARY: Glucose-Capillary: 85 mg/dL (ref 70–99)

## 2010-12-31 MED ORDER — ACETAMINOPHEN 325 MG PO TABS: 650.0000 mg | ORAL_TABLET | Freq: Once | ORAL | Status: DC

## 2010-12-31 MED ORDER — ACETAMINOPHEN 325 MG PO TABS: 650.0000 mg | ORAL_TABLET | Freq: Four times a day (QID) | ORAL | Status: AC | PRN

## 2010-12-31 NOTE — ED Provider Notes (Signed)
History   25yo G2 @26 -3 by 12wk [place]. Reports hip and leg pain x2wks. When asked further also describes pain as in her vagina. Denies any recent trauma. Reports last intercourse was 10 days ago. No new sexual partners. Denies hx of STDs. Denies any rashes. Reports yellow/white vaginal discharge, increased over the last several days. No fevers. No n/v/d.   Also with c/o L ear pain x2 days. No hearing deficit. No drainage.   Denies CTX, LOF. Good FM.   Chief Complaint  Patient presents with  . Abdominal Pain   Abdominal Pain Associated symptoms include dysuria and headaches. Pertinent negatives include no fever, nausea or vomiting.    OB History    Grav Para Term Preterm Abortions TAB SAB Ect Mult Living   2 1 0 1      1      Past Medical History  Diagnosis Date  . Preeclampsia     Past Surgical History  Procedure Date  . 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    No family history on file.  History  Substance Use Topics  . Smoking status: Never Smoker   . Smokeless tobacco: Not on file  . Alcohol Use: No    Allergies: No Known Allergies  Prescriptions prior to admission  Medication Sig Dispense Refill  . acetaminophen (TYLENOL) 325 MG tablet Take 650 mg by mouth every 6 (six) hours as needed. For pain       . Prenatal Vit-Fe Fumarate-FA (PRENATAL PLUS) 65-1 MG TABS Take 2 tablets by mouth daily.          Review of Systems  Constitutional: Negative for fever and chills.  HENT: Positive for ear pain. Negative for ear discharge.   Eyes: Negative for blurred vision and double vision.  Respiratory: Negative for cough, shortness of breath and wheezing.   Cardiovascular: Negative for chest pain and palpitations.  Gastrointestinal: Positive for abdominal pain. Negative for heartburn, nausea and vomiting.  Genitourinary: Positive for dysuria. Negative for urgency.  Skin: Negative for itching and rash.  Neurological: Positive for headaches.  Negative for dizziness, seizures and loss of consciousness.  Endo/Heme/Allergies: Negative for polydipsia.  Psychiatric/Behavioral: Negative for depression and suicidal ideas.   Physical Exam   Blood pressure 136/58, pulse 83, temperature 98.2 F (36.8 C), temperature source Oral, resp. rate 29, height 5\' 3"  (1.6 m), weight 340 lb (154.223 kg).  Physical Exam  Constitutional: She is oriented to person, place, and time. She appears well-developed.       Morbidly obese  HENT:  Head: Normocephalic and atraumatic.  Right Ear: External ear normal.  Left Ear: External ear normal.       B/l TMs gray and clear  Eyes: Conjunctivae are normal. Pupils are equal, round, and reactive to light.  Neck: Normal range of motion. Neck supple.  Cardiovascular: Normal rate, regular rhythm and normal heart sounds.  Exam reveals no gallop and no friction rub.   No murmur heard. Respiratory: Effort normal and breath sounds normal. No respiratory distress. She has no wheezes. She has no rales.  GI: Soft. Bowel sounds are normal. There is no tenderness. There is no rebound.  Genitourinary: Vagina normal. No vaginal discharge found.  Musculoskeletal: Normal range of motion. She exhibits no tenderness.  Neurological: She is alert and oriented to person, place, and time. She has normal reflexes. No cranial nerve deficit.  Skin: Skin is warm and dry.    MAU Course  Procedures  MDM NST Wet Prep  Assessment and Plan  25yo G2P1001 @26 .3wks with leg/vaginal pain 1)R/o labor - Cervix ft/th/hi, no contractions on monitor.  2)Leg pain - likely musculoskeletal. Discussed increasing muscle laxity with pregnancy. Discussed stretching, activity.  3)Wet Prep/UA - Wet prep wnl; UA - shows mild dehydration. Discussed appropriate hydration. 4)Poor PNC - reports appt. with [facility] in the first week of October - instructed to keep appt. 5)Keep next scheduled appointment. D/c home.  Gwendloyn Forsee, Beverely Pace A 12/31/2010, 1:51 AM

## 2010-12-31 NOTE — Progress Notes (Signed)
Pt states she has pain in her vagina and hips and in her upper legs

## 2011-01-07 ENCOUNTER — Inpatient Hospital Stay (HOSPITAL_COMMUNITY)
Admission: AD | Admit: 2011-01-07 | Discharge: 2011-01-08 | Disposition: A | Discharge status: Home / Self Care | Payer: Self-pay | Source: Ambulatory Visit | Attending: Obstetrics & Gynecology | Admitting: Obstetrics & Gynecology

## 2011-01-07 DIAGNOSIS — O99891 Other specified diseases and conditions complicating pregnancy: Secondary | ICD-10-CM | POA: Insufficient documentation

## 2011-01-07 DIAGNOSIS — H6690 Otitis media, unspecified, unspecified ear: Secondary | ICD-10-CM | POA: Insufficient documentation

## 2011-01-07 DIAGNOSIS — K047 Periapical abscess without sinus: Secondary | ICD-10-CM | POA: Insufficient documentation

## 2011-01-07 NOTE — Progress Notes (Signed)
Pt reports she is still having pain in upper legs which she was evaluated for about 1 week ago. States she started having pain in left ear and down in her jaw on left side, states she has a bad tooth on that side. States pain is unbearable and she took tylenol but did not help

## 2011-01-08 ENCOUNTER — Encounter (HOSPITAL_COMMUNITY): Payer: Self-pay | Admitting: *Deleted

## 2011-01-08 MED ORDER — OXYCODONE-ACETAMINOPHEN 5-325 MG PO TABS: 1.0000 | ORAL_TABLET | ORAL | Status: AC | PRN

## 2011-01-08 MED ORDER — OXYCODONE-ACETAMINOPHEN 5-325 MG PO TABS
1.0000 | ORAL_TABLET | Freq: Once | ORAL | Status: AC
  Administered 2011-01-08: 1 via ORAL
  Filled 2011-01-08: qty 1

## 2011-01-08 MED ORDER — AMOXICILLIN-POT CLAVULANATE 875-125 MG PO TABS: 1.0000 | ORAL_TABLET | Freq: Two times a day (BID) | ORAL | Status: AC

## 2011-01-08 MED ORDER — AMOXICILLIN-POT CLAVULANATE 875-125 MG PO TABS
1.0000 | ORAL_TABLET | Freq: Once | ORAL | Status: AC
  Administered 2011-01-08: 1 via ORAL
  Filled 2011-01-08: qty 1

## 2011-01-08 NOTE — ED Provider Notes (Signed)
Stacie Mitchell is a 25 y.o. female [phone] presenting for eval of left-sided face/ear/jaw pain that began at 2200. Denies fever. Is here alone with her toddler; states she had an argument with her husband prior to coming in. Denies DV. Desires pain meds and is willing to purchase a cab ride home in order to be treated with medication. Reports pain at the top of her legs, exactly the same as what she presented to [facility] for 1-2 wks ago. Denies any other preg concerns (no leak or bldg). Maternal Medical History:  Reason for admission: Reason for Admission:   nausea  OB History    Grav Para Term Preterm Abortions TAB SAB Ect Mult Living   2 1 0 1      1     Past Medical History  Diagnosis Date  . Preeclampsia    Past Surgical History  Procedure Date  . 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   Family History: family history is not on file. Social History:  reports that she has never smoked. She does not have any smokeless tobacco history on file. She reports that she does not drink alcohol or use illicit drugs.  Review of Systems  Constitutional: Negative for fever.  Gastrointestinal: Negative for nausea, vomiting and diarrhea.      Blood pressure 131/76, pulse 67, temperature 99 F (37.2 C), resp. rate 20, height 5\' 2"  (1.575 m), weight 153.769 kg (339 lb), SpO2 99.00%. Maternal Exam:  Uterine Assessment: No ctx noted per toco     Fetal Exam Fetal Monitor Review: Baseline rate: 130-140; intermittent tracing due to body habitus.  Variability: moderate (6-25 bpm).       Physical Exam  Constitutional: She is oriented to person, place, and time. Vital signs are normal. She appears well-developed and well-nourished.  HENT:       Otoscopic exam performed by Telford Nab CNM: left ear with diffuse erythema; landmarks not seen; right ear with slightly less erythema; dental abscess visualized on bottom left  Respiratory: Effort normal.  GI: Soft.       Pt  morbidly obese with large pannus; very difficult to monitor baby  Musculoskeletal: Normal range of motion.  Neurological: She is alert and oriented to person, place, and time.  Skin: Skin is warm and dry.  Psychiatric:       Pt tearful and upset, apparently due to pain (holding left jaw). Appears to possibly be in more pain than one would imagine an ear infection might cause.    Prenatal labs: ABO, Rh:   Antibody:   Rubella:   RPR:    HBsAg:    HIV:    GBS:     Assessment/Plan: IUP at 27.4 wks Otitis media Dental abscess  Will treat with Percocet and first dose Augmentin here and will d/c home with Rx for the same. Receives [facility] at [facility]- encouraged to seek dental care ASAP  Cam Hai 01/08/2011, 12:32 AM

## 2011-01-08 NOTE — Progress Notes (Signed)
Pt discussed with interpreter and provider that she will get a cab home to have pain medicine.

## 2011-01-08 NOTE — Progress Notes (Signed)
Interpreter at bedside. Discussing discharge instructions with pt. Pt states her jaw feels better but she does still feel some throbbing in ear. Discussed discharge meds with pt.  And follow up care with pt.

## 2011-01-09 NOTE — Progress Notes (Signed)
Augmentin prescription called in to the [facility] 2/2 e-prescribing error.  Patient will be by the pharmacy called and informed when the prescription is ready.  Gwendlyn Hanback A 01/09/2011 12:36 PM

## 2011-01-10 NOTE — ED Provider Notes (Signed)
Attestation of Attending Supervision of Advanced Practitioner: Evaluation and management procedures were performed by the PA/NP/CNM/OB Fellow under my supervision/collaboration. Chart reviewed and agree with management and plan.  Detrick Dani A 01/10/2011 3:12 PM   

## 2011-01-20 ENCOUNTER — Other Ambulatory Visit: Payer: Self-pay | Admitting: Family Medicine

## 2011-01-20 DIAGNOSIS — Z3689 Encounter for other specified antenatal screening: Secondary | ICD-10-CM

## 2011-01-20 LAB — GC/CHLAMYDIA PROBE AMP, GENITAL
Chlamydia: NEGATIVE
Gonorrhea: NEGATIVE

## 2011-01-20 LAB — CULTURE, BETA STREP (GROUP B ONLY)

## 2011-01-20 LAB — HIV ANTIBODY (ROUTINE TESTING W REFLEX): HIV: NONREACTIVE

## 2011-01-20 LAB — RUBELLA ANTIBODY, IGM: Rubella: IMMUNE

## 2011-01-20 LAB — ANTIBODY SCREEN: Antibody Screen: NEGATIVE

## 2011-01-21 LAB — URINALYSIS, ROUTINE W REFLEX MICROSCOPIC
Bilirubin Urine: NEGATIVE
Nitrite: NEGATIVE
Specific Gravity, Urine: 1.021
Urobilinogen, UA: 0.2

## 2011-01-21 LAB — POCT PREGNANCY, URINE: Operator id: 294341

## 2011-01-21 LAB — WET PREP, GENITAL: Trich, Wet Prep: NONE SEEN

## 2011-01-22 ENCOUNTER — Ambulatory Visit (HOSPITAL_COMMUNITY)
Admission: RE | Admit: 2011-01-22 | Discharge: 2011-01-22 | Disposition: A | Discharge status: Home / Self Care | Payer: Medicaid Other | Source: Ambulatory Visit | Attending: Family Medicine | Admitting: Family Medicine

## 2011-01-22 DIAGNOSIS — Z363 Encounter for antenatal screening for malformations: Secondary | ICD-10-CM | POA: Insufficient documentation

## 2011-01-22 DIAGNOSIS — O358XX0 Maternal care for other (suspected) fetal abnormality and damage, not applicable or unspecified: Secondary | ICD-10-CM | POA: Insufficient documentation

## 2011-01-22 DIAGNOSIS — E669 Obesity, unspecified: Secondary | ICD-10-CM | POA: Insufficient documentation

## 2011-01-22 DIAGNOSIS — O34219 Maternal care for unspecified type scar from previous cesarean delivery: Secondary | ICD-10-CM | POA: Insufficient documentation

## 2011-01-22 DIAGNOSIS — Z1389 Encounter for screening for other disorder: Secondary | ICD-10-CM | POA: Insufficient documentation

## 2011-01-22 DIAGNOSIS — O09299 Supervision of pregnancy with other poor reproductive or obstetric history, unspecified trimester: Secondary | ICD-10-CM | POA: Insufficient documentation

## 2011-01-22 DIAGNOSIS — Z3689 Encounter for other specified antenatal screening: Secondary | ICD-10-CM

## 2011-01-22 DIAGNOSIS — O99210 Obesity complicating pregnancy, unspecified trimester: Secondary | ICD-10-CM | POA: Insufficient documentation

## 2011-02-03 ENCOUNTER — Other Ambulatory Visit: Payer: Self-pay | Admitting: Family Medicine

## 2011-02-03 DIAGNOSIS — Z3689 Encounter for other specified antenatal screening: Secondary | ICD-10-CM

## 2011-02-07 ENCOUNTER — Ambulatory Visit (HOSPITAL_COMMUNITY)
Admission: RE | Admit: 2011-02-07 | Discharge: 2011-02-07 | Disposition: A | Discharge status: Home / Self Care | Payer: Medicaid Other | Source: Ambulatory Visit | Attending: Family Medicine | Admitting: Family Medicine

## 2011-02-07 DIAGNOSIS — O34219 Maternal care for unspecified type scar from previous cesarean delivery: Secondary | ICD-10-CM | POA: Insufficient documentation

## 2011-02-07 DIAGNOSIS — Z3689 Encounter for other specified antenatal screening: Secondary | ICD-10-CM

## 2011-02-07 DIAGNOSIS — E669 Obesity, unspecified: Secondary | ICD-10-CM | POA: Insufficient documentation

## 2011-02-07 DIAGNOSIS — O09299 Supervision of pregnancy with other poor reproductive or obstetric history, unspecified trimester: Secondary | ICD-10-CM | POA: Insufficient documentation

## 2011-02-07 DIAGNOSIS — O99210 Obesity complicating pregnancy, unspecified trimester: Secondary | ICD-10-CM | POA: Insufficient documentation

## 2011-03-17 ENCOUNTER — Other Ambulatory Visit (HOSPITAL_COMMUNITY): Payer: Self-pay | Admitting: Physician Assistant

## 2011-03-17 DIAGNOSIS — Z3689 Encounter for other specified antenatal screening: Secondary | ICD-10-CM

## 2011-03-20 ENCOUNTER — Ambulatory Visit (HOSPITAL_COMMUNITY)
Admission: RE | Admit: 2011-03-20 | Discharge: 2011-03-20 | Disposition: A | Discharge status: Home / Self Care | Payer: Self-pay | Source: Ambulatory Visit | Attending: Physician Assistant | Admitting: Physician Assistant

## 2011-03-20 DIAGNOSIS — E669 Obesity, unspecified: Secondary | ICD-10-CM | POA: Insufficient documentation

## 2011-03-20 DIAGNOSIS — O99210 Obesity complicating pregnancy, unspecified trimester: Secondary | ICD-10-CM | POA: Insufficient documentation

## 2011-03-20 DIAGNOSIS — O34219 Maternal care for unspecified type scar from previous cesarean delivery: Secondary | ICD-10-CM | POA: Insufficient documentation

## 2011-03-20 DIAGNOSIS — O09299 Supervision of pregnancy with other poor reproductive or obstetric history, unspecified trimester: Secondary | ICD-10-CM | POA: Insufficient documentation

## 2011-03-20 DIAGNOSIS — Z3689 Encounter for other specified antenatal screening: Secondary | ICD-10-CM

## 2011-04-04 ENCOUNTER — Other Ambulatory Visit (HOSPITAL_COMMUNITY): Payer: Self-pay | Admitting: Physician Assistant

## 2011-04-04 DIAGNOSIS — O480 Post-term pregnancy: Secondary | ICD-10-CM

## 2011-04-09 ENCOUNTER — Inpatient Hospital Stay (HOSPITAL_COMMUNITY)
Admission: AD | Admit: 2011-04-09 | Discharge: 2011-04-10 | Disposition: A | Discharge status: Home / Self Care | Payer: Self-pay | Source: Ambulatory Visit | Attending: Obstetrics & Gynecology | Admitting: Obstetrics & Gynecology

## 2011-04-09 ENCOUNTER — Encounter (HOSPITAL_COMMUNITY): Payer: Self-pay | Admitting: *Deleted

## 2011-04-09 ENCOUNTER — Telehealth (HOSPITAL_COMMUNITY): Payer: Self-pay | Admitting: *Deleted

## 2011-04-09 DIAGNOSIS — O2390 Unspecified genitourinary tract infection in pregnancy, unspecified trimester: Secondary | ICD-10-CM | POA: Insufficient documentation

## 2011-04-09 DIAGNOSIS — Z3490 Encounter for supervision of normal pregnancy, unspecified, unspecified trimester: Secondary | ICD-10-CM

## 2011-04-09 DIAGNOSIS — R109 Unspecified abdominal pain: Secondary | ICD-10-CM | POA: Insufficient documentation

## 2011-04-09 DIAGNOSIS — N390 Urinary tract infection, site not specified: Secondary | ICD-10-CM | POA: Insufficient documentation

## 2011-04-09 DIAGNOSIS — O479 False labor, unspecified: Secondary | ICD-10-CM | POA: Insufficient documentation

## 2011-04-09 NOTE — Progress Notes (Signed)
Pt states she has pain in abdomen, vagina and back

## 2011-04-09 NOTE — Telephone Encounter (Signed)
Preadmission screen  

## 2011-04-10 ENCOUNTER — Ambulatory Visit (HOSPITAL_COMMUNITY)
Admission: RE | Admit: 2011-04-10 | Discharge: 2011-04-10 | Disposition: A | Discharge status: Home / Self Care | Payer: Self-pay | Source: Ambulatory Visit | Attending: Physician Assistant | Admitting: Physician Assistant

## 2011-04-10 DIAGNOSIS — E669 Obesity, unspecified: Secondary | ICD-10-CM | POA: Insufficient documentation

## 2011-04-10 DIAGNOSIS — O99210 Obesity complicating pregnancy, unspecified trimester: Secondary | ICD-10-CM | POA: Insufficient documentation

## 2011-04-10 DIAGNOSIS — O480 Post-term pregnancy: Secondary | ICD-10-CM

## 2011-04-10 DIAGNOSIS — O09299 Supervision of pregnancy with other poor reproductive or obstetric history, unspecified trimester: Secondary | ICD-10-CM | POA: Insufficient documentation

## 2011-04-10 DIAGNOSIS — O34219 Maternal care for unspecified type scar from previous cesarean delivery: Secondary | ICD-10-CM | POA: Insufficient documentation

## 2011-04-10 LAB — URINALYSIS, ROUTINE W REFLEX MICROSCOPIC
Bilirubin Urine: NEGATIVE
Glucose, UA: NEGATIVE mg/dL
Ketones, ur: NEGATIVE mg/dL
pH: 5.5 (ref 5.0–8.0)

## 2011-04-10 MED ORDER — CEPHALEXIN 500 MG PO CAPS: 500.0000 mg | ORAL_CAPSULE | Freq: Four times a day (QID) | ORAL | Status: DC
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
## 2011-04-12 ENCOUNTER — Encounter (HOSPITAL_COMMUNITY): Payer: Self-pay | Admitting: Anesthesiology

## 2011-04-12 ENCOUNTER — Inpatient Hospital Stay (HOSPITAL_COMMUNITY): Payer: Medicaid Other | Admitting: Anesthesiology

## 2011-04-12 ENCOUNTER — Inpatient Hospital Stay (HOSPITAL_COMMUNITY)
Admission: AD | Admit: 2011-04-12 | Discharge: 2011-04-15 | DRG: 775 | Disposition: A | Discharge status: Home / Self Care | Payer: Medicaid Other | Source: Ambulatory Visit | Attending: Obstetrics & Gynecology | Admitting: Obstetrics & Gynecology

## 2011-04-12 ENCOUNTER — Encounter (HOSPITAL_COMMUNITY): Payer: Self-pay | Admitting: *Deleted

## 2011-04-12 DIAGNOSIS — Z22330 Carrier of Group B streptococcus: Secondary | ICD-10-CM

## 2011-04-12 DIAGNOSIS — N390 Urinary tract infection, site not specified: Secondary | ICD-10-CM | POA: Diagnosis present

## 2011-04-12 DIAGNOSIS — E669 Obesity, unspecified: Secondary | ICD-10-CM | POA: Diagnosis present

## 2011-04-12 DIAGNOSIS — O2340 Unspecified infection of urinary tract in pregnancy, unspecified trimester: Secondary | ICD-10-CM

## 2011-04-12 DIAGNOSIS — O34219 Maternal care for unspecified type scar from previous cesarean delivery: Principal | ICD-10-CM | POA: Diagnosis present

## 2011-04-12 DIAGNOSIS — O99214 Obesity complicating childbirth: Secondary | ICD-10-CM | POA: Diagnosis present

## 2011-04-12 DIAGNOSIS — O99892 Other specified diseases and conditions complicating childbirth: Secondary | ICD-10-CM | POA: Diagnosis present

## 2011-04-12 DIAGNOSIS — O2390 Unspecified genitourinary tract infection in pregnancy, unspecified trimester: Secondary | ICD-10-CM | POA: Diagnosis present

## 2011-04-12 HISTORY — DX: Unspecified infection of urinary tract in pregnancy, unspecified trimester: O23.40

## 2011-04-12 LAB — CBC
Hemoglobin: 13.9 g/dL (ref 12.0–15.0)
MCH: 31.4 pg (ref 26.0–34.0)
MCHC: 35.5 g/dL (ref 30.0–36.0)
RDW: 13.1 % (ref 11.5–15.5)

## 2011-04-12 MED ORDER — EPHEDRINE 5 MG/ML INJ
10.0000 mg | INTRAVENOUS | Status: DC | PRN
  Filled 2011-04-12: qty 4

## 2011-04-12 MED ORDER — PHENYLEPHRINE 40 MCG/ML (10ML) SYRINGE FOR IV PUSH (FOR BLOOD PRESSURE SUPPORT)
80.0000 ug | PREFILLED_SYRINGE | INTRAVENOUS | Status: DC | PRN
  Filled 2011-04-12: qty 5

## 2011-04-12 MED ORDER — LACTATED RINGERS IV SOLN
500.0000 mL | INTRAVENOUS | Status: DC | PRN
  Administered 2011-04-13: 500 mL via INTRAVENOUS

## 2011-04-12 MED ORDER — PENICILLIN G POTASSIUM 5000000 UNITS IJ SOLR
5.0000 10*6.[IU] | Freq: Once | INTRAVENOUS | Status: DC
  Filled 2011-04-12: qty 5

## 2011-04-12 MED ORDER — EPHEDRINE 5 MG/ML INJ: 10.0000 mg | INTRAVENOUS | Status: DC | PRN

## 2011-04-12 MED ORDER — FENTANYL 2.5 MCG/ML BUPIVACAINE 1/10 % EPIDURAL INFUSION (WH - ANES)
14.0000 mL/h | INTRAMUSCULAR | Status: DC
  Administered 2011-04-13 (×2): 14 mL/h via EPIDURAL
  Filled 2011-04-12 (×3): qty 60

## 2011-04-12 MED ORDER — SODIUM BICARBONATE 8.4 % IV SOLN
INTRAVENOUS | Status: DC | PRN
  Administered 2011-04-12: 4 mL via EPIDURAL

## 2011-04-12 MED ORDER — ACETAMINOPHEN 325 MG PO TABS: 650.0000 mg | ORAL_TABLET | ORAL | Status: DC | PRN

## 2011-04-12 MED ORDER — IBUPROFEN 600 MG PO TABS: 600.0000 mg | ORAL_TABLET | Freq: Four times a day (QID) | ORAL | Status: DC | PRN

## 2011-04-12 MED ORDER — NALBUPHINE SYRINGE 5 MG/0.5 ML: 10.0000 mg | INJECTION | INTRAMUSCULAR | Status: DC | PRN

## 2011-04-12 MED ORDER — DIPHENHYDRAMINE HCL 50 MG/ML IJ SOLN: 12.5000 mg | INTRAMUSCULAR | Status: DC | PRN

## 2011-04-12 MED ORDER — CITRIC ACID-SODIUM CITRATE 334-500 MG/5ML PO SOLN: 30.0000 mL | ORAL | Status: DC | PRN

## 2011-04-12 MED ORDER — ONDANSETRON HCL 4 MG/2ML IJ SOLN: 4.0000 mg | Freq: Four times a day (QID) | INTRAMUSCULAR | Status: DC | PRN

## 2011-04-12 MED ORDER — OXYCODONE-ACETAMINOPHEN 5-325 MG PO TABS: 2.0000 | ORAL_TABLET | ORAL | Status: DC | PRN

## 2011-04-12 MED ORDER — LACTATED RINGERS IV SOLN
INTRAVENOUS | Status: DC
  Administered 2011-04-12: 125 mL/h via INTRAVENOUS
  Administered 2011-04-13: 09:00:00 via INTRAVENOUS

## 2011-04-12 MED ORDER — FLEET ENEMA 7-19 GM/118ML RE ENEM: 1.0000 | ENEMA | RECTAL | Status: DC | PRN

## 2011-04-12 MED ORDER — OXYTOCIN 20 UNITS IN LACTATED RINGERS INFUSION - SIMPLE
125.0000 mL/h | Freq: Once | INTRAVENOUS | Status: AC
  Administered 2011-04-13: 999 mL/h via INTRAVENOUS

## 2011-04-12 MED ORDER — PENICILLIN G POTASSIUM 5000000 UNITS IJ SOLR
2.5000 10*6.[IU] | INTRAVENOUS | Status: DC
  Administered 2011-04-13 (×3): 2.5 10*6.[IU] via INTRAVENOUS
  Filled 2011-04-12 (×7): qty 2.5

## 2011-04-12 MED ORDER — LACTATED RINGERS IV SOLN
500.0000 mL | Freq: Once | INTRAVENOUS | Status: AC
  Administered 2011-04-12: 500 mL via INTRAVENOUS

## 2011-04-12 MED ORDER — PHENYLEPHRINE 40 MCG/ML (10ML) SYRINGE FOR IV PUSH (FOR BLOOD PRESSURE SUPPORT): 80.0000 ug | PREFILLED_SYRINGE | INTRAVENOUS | Status: DC | PRN

## 2011-04-12 MED ORDER — LIDOCAINE HCL (PF) 1 % IJ SOLN
30.0000 mL | INTRAMUSCULAR | Status: DC | PRN
  Filled 2011-04-12: qty 30

## 2011-04-12 MED ORDER — OXYTOCIN BOLUS FROM INFUSION
500.0000 mL | Freq: Once | INTRAVENOUS | Status: DC
  Filled 2011-04-12: qty 500
  Filled 2011-04-12: qty 1000

## 2011-04-12 NOTE — Progress Notes (Signed)
Up to walk.

## 2011-04-12 NOTE — Progress Notes (Signed)
Philipp Deputy CNM notified of pt's repeat sve after walking. Will admit to [facility]

## 2011-04-12 NOTE — Progress Notes (Signed)
Philipp Deputy CNM notified of pt's admission and status. Aware of ctx pattern, sve, hx PIH, VBAC. Will obs and reck cervix. Pt may walk

## 2011-04-12 NOTE — Progress Notes (Signed)
Pt states, " I've been having contractions since this morning, and they are stronger now."

## 2011-04-12 NOTE — Progress Notes (Signed)
Report called to [employer] in [facility]. Pt to [facility] ambulatory

## 2011-04-12 NOTE — Progress Notes (Signed)
States n/v/d started after ctxs and feels is due to pain and ctxs. Has been able to eat and drink

## 2011-04-12 NOTE — Anesthesia Procedure Notes (Signed)
Epidural Patient location during procedure: OB  Preanesthetic Checklist Completed: patient identified, site marked, surgical consent, pre-op evaluation, timeout performed, IV checked, risks and benefits discussed and monitors and equipment checked  Epidural Patient position: sitting Prep: site prepped and draped and DuraPrep Patient monitoring: continuous pulse ox and blood pressure Approach: midline Injection technique: LOR air  Needle:  Needle type: Tuohy  Needle gauge: 17 G Needle length: 9 cm Needle insertion depth: 9 cm Catheter type: closed end flexible Catheter size: 19 Gauge Test dose: negative  Assessment Events: blood not aspirated, injection not painful, no injection resistance, negative IV test and no paresthesia  Additional Notes Dosing of Epidural:  1st dose, through catheter ............................................Marland Kitchen epi 1:200K + Xylocaine 40 mg  2nd dose, through catheter, after waiting 3 minutes...Marland KitchenMarland Kitchenepi 1:200K + Xylocaine 40 mg  3rd dose, through catheter after waiting 3 minutes .............................Marcaine   4mg    ( mg Marcaine are expressed as equivilent  cc's medication removed from the 0.1%Bupiv / fentanyl syringe from L&D pump)  ( 2% Xylo charted as a single dose in Epic Meds for ease of charting; actual dosing was fractionated as above, for saftey's sake)  As each dose occurred, patient was free of IV sx; and patient exhibited no evidence of SA injection.  Patient is more comfortable after epidural dosed. Please see RN's note for documentation of vital signs,and FHR which are stable.

## 2011-04-12 NOTE — Anesthesia Preprocedure Evaluation (Signed)
Anesthesia Evaluation  Patient identified by MRN, date of birth, ID band Patient awake    Reviewed: Allergy & Precautions, H&P , Patient's Chart, lab work & pertinent test results  Airway Mallampati: III TM Distance: >3 FB Neck ROM: full    Dental  (+) Teeth Intact   Pulmonary  clear to auscultation        Cardiovascular hypertension, regular Normal    Neuro/Psych    GI/Hepatic   Endo/Other  Diabetes mellitus-Morbid obesity  Renal/GU      Musculoskeletal   Abdominal   Peds  Hematology   Anesthesia Other Findings       Reproductive/Obstetrics (+) Pregnancy                           Anesthesia Physical Anesthesia Plan  ASA: III  Anesthesia Plan: Epidural   Post-op Pain Management:    Induction:   Airway Management Planned:   Additional Equipment:   Intra-op Plan:   Post-operative Plan:   Informed Consent: I have reviewed the patients History and Physical, chart, labs and discussed the procedure including the risks, benefits and alternatives for the proposed anesthesia with the patient or authorized representative who has indicated his/her understanding and acceptance.   Dental Advisory Given  Plan Discussed with:   Anesthesia Plan Comments: (Labs checked- platelets confirmed with RN in room. Fetal heart tracing, per RN, reported to be stable enough for sitting procedure. Discussed epidural, and patient consents to the procedure:  included risk of possible headache,backache, failed block, allergic reaction, and nerve injury. This patient was asked if she had any questions or concerns before the procedure started. )        Anesthesia Quick Evaluation

## 2011-04-13 ENCOUNTER — Encounter (HOSPITAL_COMMUNITY): Payer: Self-pay | Admitting: Anesthesiology

## 2011-04-13 ENCOUNTER — Encounter (HOSPITAL_COMMUNITY): Payer: Self-pay | Admitting: *Deleted

## 2011-04-13 DIAGNOSIS — O2390 Unspecified genitourinary tract infection in pregnancy, unspecified trimester: Secondary | ICD-10-CM

## 2011-04-13 DIAGNOSIS — O99214 Obesity complicating childbirth: Secondary | ICD-10-CM

## 2011-04-13 DIAGNOSIS — O34219 Maternal care for unspecified type scar from previous cesarean delivery: Secondary | ICD-10-CM

## 2011-04-13 DIAGNOSIS — E669 Obesity, unspecified: Secondary | ICD-10-CM

## 2011-04-13 DIAGNOSIS — N390 Urinary tract infection, site not specified: Secondary | ICD-10-CM

## 2011-04-13 LAB — RPR: RPR Ser Ql: NONREACTIVE

## 2011-04-13 MED ORDER — IBUPROFEN 600 MG PO TABS
600.0000 mg | ORAL_TABLET | Freq: Four times a day (QID) | ORAL | Status: DC
  Administered 2011-04-13 – 2011-04-15 (×7): 600 mg via ORAL
  Filled 2011-04-13 (×8): qty 1

## 2011-04-13 MED ORDER — SIMETHICONE 80 MG PO CHEW: 80.0000 mg | CHEWABLE_TABLET | ORAL | Status: DC | PRN

## 2011-04-13 MED ORDER — FENTANYL 2.5 MCG/ML BUPIVACAINE 1/10 % EPIDURAL INFUSION (WH - ANES)
INTRAMUSCULAR | Status: DC | PRN
  Administered 2011-04-12: 12 mL/h via EPIDURAL

## 2011-04-13 MED ORDER — DIPHENHYDRAMINE HCL 25 MG PO CAPS: 25.0000 mg | ORAL_CAPSULE | Freq: Four times a day (QID) | ORAL | Status: DC | PRN

## 2011-04-13 MED ORDER — ONDANSETRON HCL 4 MG/2ML IJ SOLN: 4.0000 mg | INTRAMUSCULAR | Status: DC | PRN

## 2011-04-13 MED ORDER — CEPHALEXIN 500 MG PO CAPS
500.0000 mg | ORAL_CAPSULE | Freq: Four times a day (QID) | ORAL | Status: DC
  Administered 2011-04-13 – 2011-04-15 (×7): 500 mg via ORAL
  Filled 2011-04-13 (×13): qty 1

## 2011-04-13 MED ORDER — OXYCODONE-ACETAMINOPHEN 5-325 MG PO TABS
1.0000 | ORAL_TABLET | ORAL | Status: DC | PRN
  Administered 2011-04-13: 2 via ORAL
  Administered 2011-04-14 (×2): 1 via ORAL
  Filled 2011-04-13 (×3): qty 1

## 2011-04-13 MED ORDER — ONDANSETRON HCL 4 MG PO TABS: 4.0000 mg | ORAL_TABLET | ORAL | Status: DC | PRN

## 2011-04-13 MED ORDER — OXYTOCIN 20 UNITS IN LACTATED RINGERS INFUSION - SIMPLE
1.0000 m[IU]/min | INTRAVENOUS | Status: DC
  Administered 2011-04-13: 2 m[IU]/min via INTRAVENOUS
  Filled 2011-04-13: qty 1000

## 2011-04-13 MED ORDER — ZOLPIDEM TARTRATE 5 MG PO TABS: 5.0000 mg | ORAL_TABLET | Freq: Every evening | ORAL | Status: DC | PRN

## 2011-04-13 MED ORDER — SENNOSIDES-DOCUSATE SODIUM 8.6-50 MG PO TABS
2.0000 | ORAL_TABLET | Freq: Every day | ORAL | Status: DC
  Administered 2011-04-13 – 2011-04-14 (×2): 2 via ORAL

## 2011-04-13 MED ORDER — WITCH HAZEL-GLYCERIN EX PADS: 1.0000 "application " | MEDICATED_PAD | CUTANEOUS | Status: DC | PRN

## 2011-04-13 MED ORDER — DIBUCAINE 1 % RE OINT
1.0000 "application " | TOPICAL_OINTMENT | RECTAL | Status: DC | PRN
  Filled 2011-04-13: qty 28

## 2011-04-13 MED ORDER — PRENATAL MULTIVITAMIN CH
1.0000 | ORAL_TABLET | Freq: Every day | ORAL | Status: DC
  Administered 2011-04-13 – 2011-04-15 (×3): 1 via ORAL
  Filled 2011-04-13 (×3): qty 1

## 2011-04-13 MED ORDER — TETANUS-DIPHTH-ACELL PERTUSSIS 5-2.5-18.5 LF-MCG/0.5 IM SUSP
0.5000 mL | Freq: Once | INTRAMUSCULAR | Status: DC
  Filled 2011-04-13: qty 0.5

## 2011-04-13 MED ORDER — BENZOCAINE-MENTHOL 20-0.5 % EX AERO
1.0000 "application " | INHALATION_SPRAY | CUTANEOUS | Status: DC | PRN
  Filled 2011-04-13: qty 56

## 2011-04-13 MED ORDER — BENZOCAINE-MENTHOL 20-0.5 % EX AERO
INHALATION_SPRAY | CUTANEOUS | Status: AC
  Filled 2011-04-13: qty 56

## 2011-04-13 MED ORDER — TERBUTALINE SULFATE 1 MG/ML IJ SOLN: 0.2500 mg | Freq: Once | INTRAMUSCULAR | Status: DC | PRN

## 2011-04-13 MED ORDER — CEPHALEXIN 500 MG PO CAPS: 500.0000 mg | ORAL_CAPSULE | Freq: Four times a day (QID) | ORAL | Status: DC

## 2011-04-13 MED ORDER — LANOLIN HYDROUS EX OINT: TOPICAL_OINTMENT | CUTANEOUS | Status: DC | PRN

## 2011-04-13 NOTE — Anesthesia Postprocedure Evaluation (Signed)
  Anesthesia Post-op Note  Patient: Stacie Mitchell Kelly Splinter  Procedure(s) Performed: * No surgery found *  Patient Location: PACU and Mother/Baby  Anesthesia Type: Epidural  Level of Consciousness: awake, alert  and oriented  Airway and Oxygen Therapy: Patient Spontanous Breathing   Post-op Assessment: Patient's Cardiovascular Status Stable and Respiratory Function Stable  Post-op Vital Signs: stable  Complications: No apparent anesthesia complications

## 2011-04-13 NOTE — Progress Notes (Signed)
Subjective: Patient feeling some contractions mildly.    Has epidural.  Objective: BP 96/65  Pulse 84  Temp(Src) 98 F (36.7 C) (Oral)  Resp 20  Ht 5\' 2"  (1.575 m)  Wt 162.104 kg (357 lb 6 oz)  BMI 65.36 kg/m2  SpO2 100% I/O last 3 completed shifts: In: -  Out: 725 [Urine:725]    FHT:  FHR: 130s bpm, variability: moderate,  accelerations:  Abscent,  decelerations:  Absent UC:   Not visible. SVE:   Dilation: 10 Effacement (%): 100 Station: -1 Exam by:: DuPont, RN  Labs: Lab Results  Component Value Date   WBC 9.4 04/12/2011   HGB 13.9 04/12/2011   HCT 39.2 04/12/2011   MCV 88.5 04/12/2011   PLT 231 04/12/2011    Assessment / Plan: IUPC placed.  will monitor and start pitocin to help with decent if needed.  STINSON, JACOB JEHIEL 04/13/2011, 8:33 AM
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
## 2011-04-13 NOTE — Progress Notes (Signed)
SVD of viable female per Dr Adrian Blackwater.  Apgars 6/9.

## 2011-04-13 NOTE — Progress Notes (Signed)
Patient ID: Stacie Mitchell, female   DOB: [birth year], 25 y.o.   MRN: [id number] Comfortable with epidural FHR 130s with occ mi variables; + accels Ctx difficult to trace but seem to be q 4-5 mins Cx 9/C/+1  Will await urge to push  Cam Hai 04/13/2011 4:54 AM

## 2011-04-13 NOTE — Progress Notes (Signed)
changr time for 1400

## 2011-04-14 NOTE — Progress Notes (Signed)
Post Partum Day 1 Subjective: up ad lib, voiding, tolerating PO and legs weak which she atrributes to epidural. Some afterpains. Breastfeeding well.   Objective: Blood pressure 100/68, pulse 102, temperature 98.6 F (37 C), temperature source Oral, resp. rate 20, height 5\' 2"  (1.575 m), weight 162.104 kg (357 lb 6 oz), SpO2 98.00%, unknown if currently breastfeeding.  Physical Exam:  General: alert, cooperative and no distress Lochia: appropriate Uterine Fundus: firm Incision: n/a DVT Evaluation: No evidence of DVT seen on physical exam.   Basename 04/12/11 2250  HGB 13.9  HCT 39.2    Assessment/Plan: Plan for discharge tomorrow Declines early discharge.   LOS: 2 days   Stacie Mitchell 04/14/2011, 12:09 PM

## 2011-04-14 NOTE — Progress Notes (Signed)
UR chart review completed.  

## 2011-04-14 NOTE — Progress Notes (Signed)
[facility] Department of Clinical Social Work Documentation of Interpretation   I assisted __Coleen SW_________________ with interpretation of ____questions__________________ for this patient.
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
## 2011-04-15 DIAGNOSIS — O34219 Maternal care for unspecified type scar from previous cesarean delivery: Secondary | ICD-10-CM | POA: Diagnosis not present

## 2011-04-15 MED ORDER — IBUPROFEN 600 MG PO TABS: 600.0000 mg | ORAL_TABLET | Freq: Four times a day (QID) | ORAL | Status: AC

## 2011-04-15 NOTE — Progress Notes (Signed)
Patient ID: Stacie Mitchell, female   DOB: 1985/05/13, 26 y.o.   MRN: [id number] Spoke with RN and Bud Face, interpreter.  Due to concerns with social situation baby is not discharged so mother will be rooming in and again meet with SW tomorrow am.

## 2011-04-15 NOTE — Progress Notes (Signed)
[facility] Department of Clinical Social Work Documentation of Interpretation   I assisted __[place] CNM_________________ with interpretation of __plan of care____________________ for this patient. 

## 2011-04-15 NOTE — Progress Notes (Deleted)
Post Partum Day 2 Subjective: no complaints, up ad lib, voiding, tolerating PO and + flatus  Objective: Blood pressure 113/73, pulse 84, temperature 97.5 F (36.4 C), temperature source Oral, resp. rate 18, height 5\' 2"  (1.575 m), weight 162.104 kg (357 lb 6 oz), SpO2 98.00%, unknown if currently breastfeeding.  Physical Exam:  General: alert, cooperative and morbidly obese Lochia: appropriate Uterine Fundus: firm Incision: NA DVT Evaluation: Negative Homan's sign.   Basename 04/12/11 2250  HGB 13.9  HCT 39.2    Assessment/Plan: Discharge home, Breastfeeding and Contraception Mirena No circ   LOS: 3 days   Dorathy Kinsman 04/15/2011, 7:58 AM

## 2011-04-15 NOTE — Progress Notes (Signed)
Post Partum Day 2 Subjective: no complaints, up ad lib, voiding, tolerating PO and + flatus Had difficulty w/ breast feeding in past.  Objective: Blood pressure 113/73, pulse 84, temperature 97.5 F (36.4 C), temperature source Oral, resp. rate 18, height 5\' 2"  (1.575 m), weight 162.104 kg (357 lb 6 oz), SpO2 98.00%, unknown if currently breastfeeding.  Physical Exam:  General: alert, cooperative, no distress and morbidly obese Lochia: appropriate Uterine Fundus: firm Incision: NA DVT Evaluation: Negative Homan's sign.   Basename 04/12/11 2250  HGB 13.9  HCT 39.2    Assessment/Plan: Discharge home, Breastfeeding/bottle and Contraception Mirena Reminded of OP LC availability   LOS: 3 days   Stacie Mitchell 04/15/2011, 7:39 AM

## 2011-04-15 NOTE — Discharge Summary (Signed)
Attestation of Attending Supervision of Advanced Practitioner: Evaluation and management procedures were performed by the PA/NP/CNM/OB Fellow under my supervision/collaboration. Chart reviewed, and agree with management and plan.  UGONNA ANYANWU, M.D. 04/15/2011 8:40 AM   

## 2011-04-15 NOTE — Discharge Summary (Signed)
Obstetric Discharge Summary Reason for Admission: onset of labor Prenatal Procedures: none Intrapartum Procedures: spontaneous vaginal delivery, VBAC Postpartum Procedures: none Complications-Operative and Postpartum: none Hemoglobin  Date Value Range Status  04/12/2011 13.9  12.0-15.0 (g/dL) Final     HCT  Date Value Range Status  04/12/2011 39.2  36.0-46.0 (%) Final    Discharge Diagnoses: Term Pregnancy-delivered  Discharge Information: Date: 04/15/2011 Activity: pelvic rest Diet: routine Medications: Ibuprofen, complete Keflex Rx Condition: stable Instructions: refer to practice specific booklet Discharge to: home Follow-up Information    Follow up with [facility] [place] in 5 weeks. (or [facility]U as needed)    Contact information:   [address] [employer] [place] [address]          Newborn Data: Live born female  Birth Weight: 8 lb 7.3 oz (3836 g) APGAR: 6, 9  Home with mother. No circ Mirena  Jillane Po 04/15/2011, 8:02 AM

## 2011-04-16 ENCOUNTER — Inpatient Hospital Stay (HOSPITAL_COMMUNITY): Admission: RE | Admit: 2011-04-16 | Payer: Self-pay | Source: Ambulatory Visit

## 2011-04-16 NOTE — Addendum Note (Signed)
Addendum  created 04/16/11 1917 by Jiles Garter, MD   Modules edited:Notes Section

## 2011-04-16 NOTE — Anesthesia Postprocedure Evaluation (Signed)
  Anesthesia Post-op Note  Patient: Stacie Mitchell  This patient has recovered from her labor epidural, and I am not aware of any complications or problems.

## 2012-05-09 IMAGING — US US OB FOLLOW-UP
1 series · 12 of 28 positions shown · non-contrast
Comparison: none

[Series 1: us ob follow up · 12 of 46 slices shown]
[im 2/46]
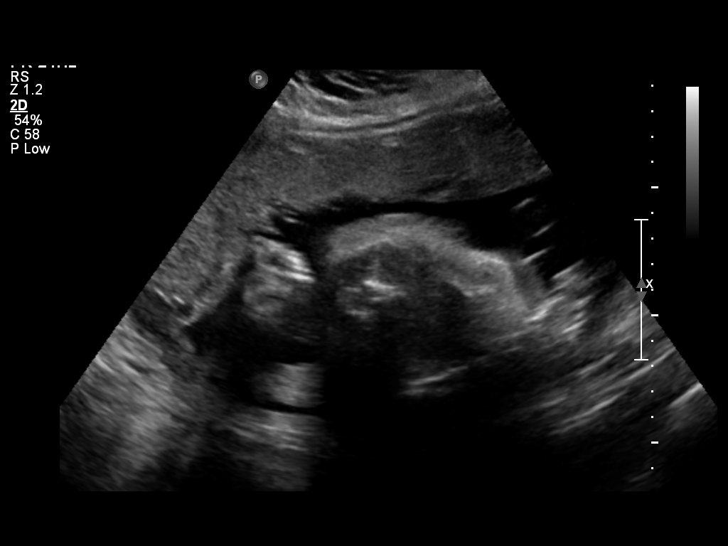
[im 6/46]
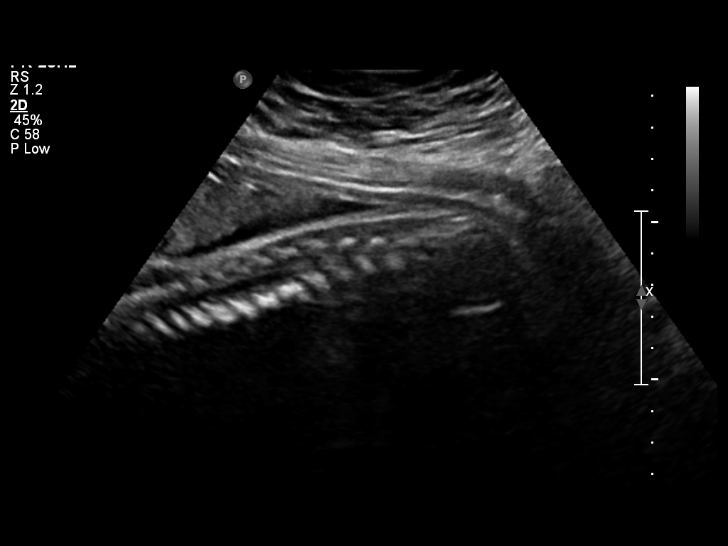
[im 9/46]
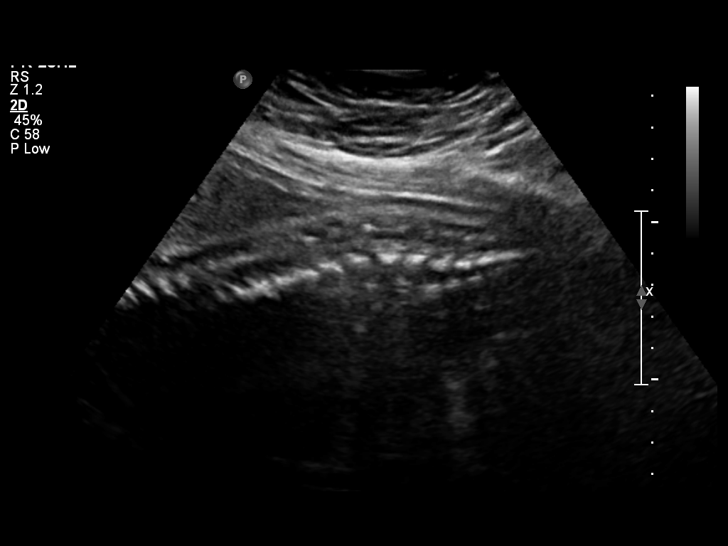
[im 14/46]
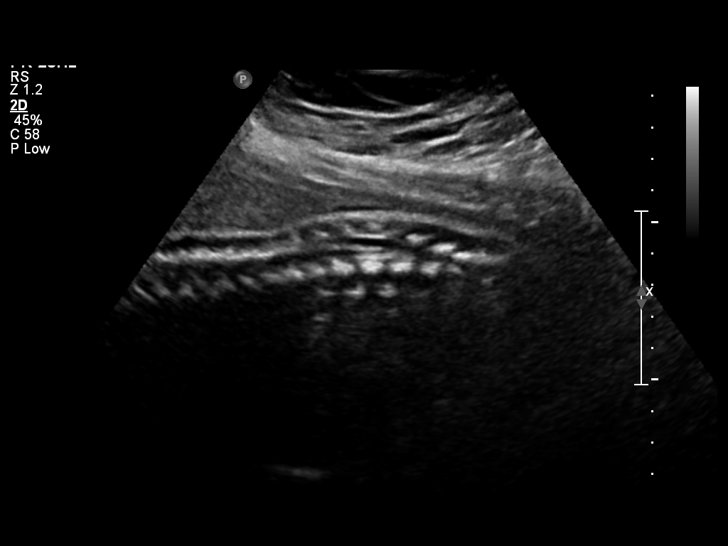
[im 17/46]
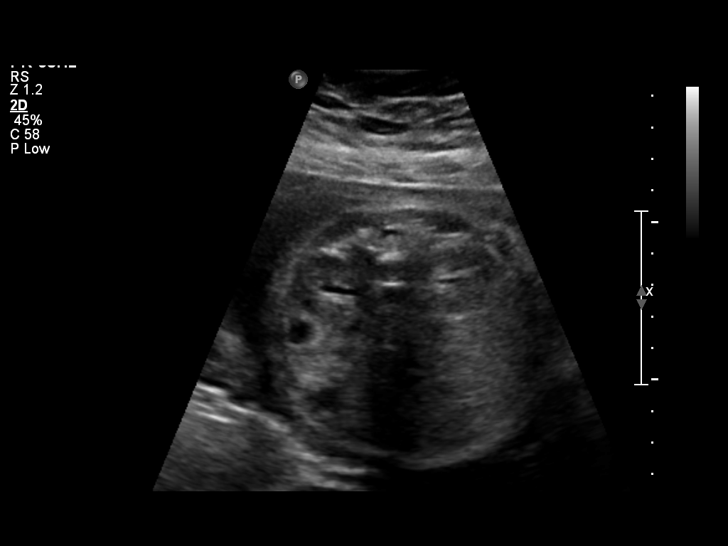
[im 21/46]
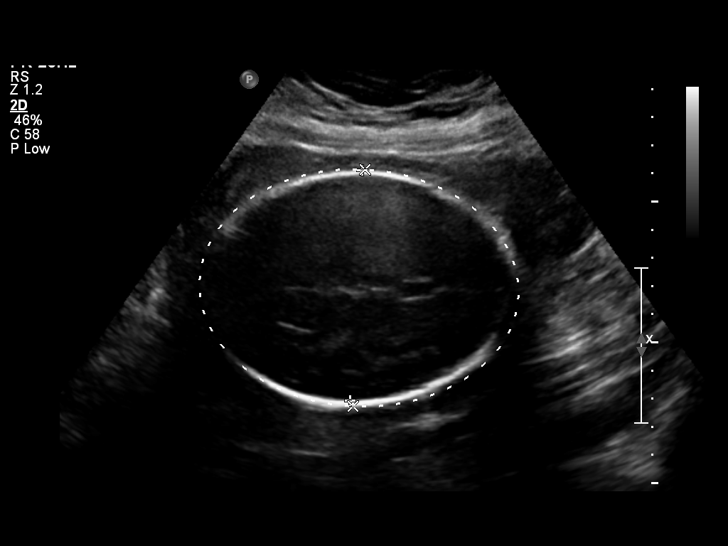
[im 26/46]
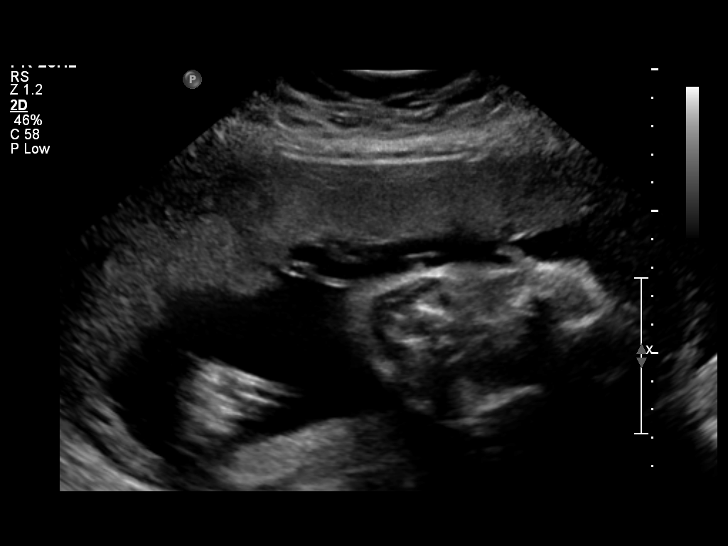
[im 29/46]
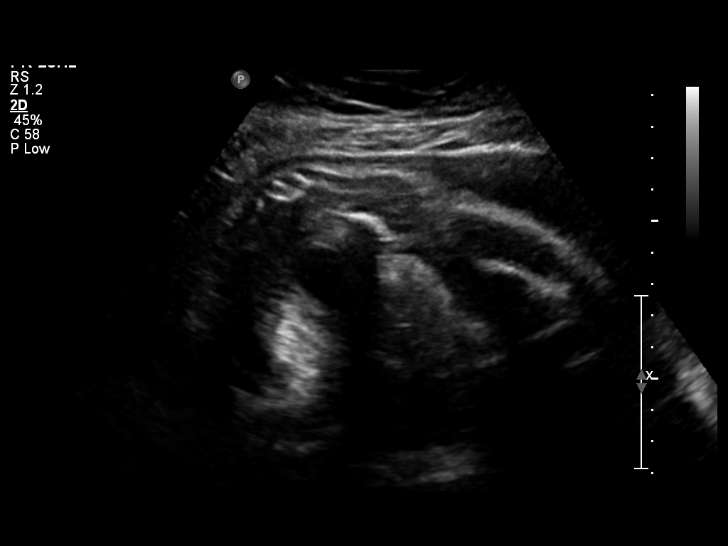
[im 32/46]
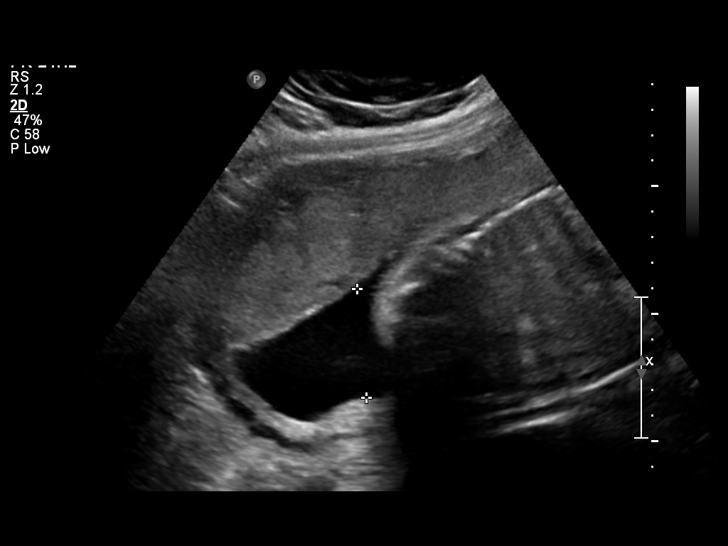
[im 37/46]
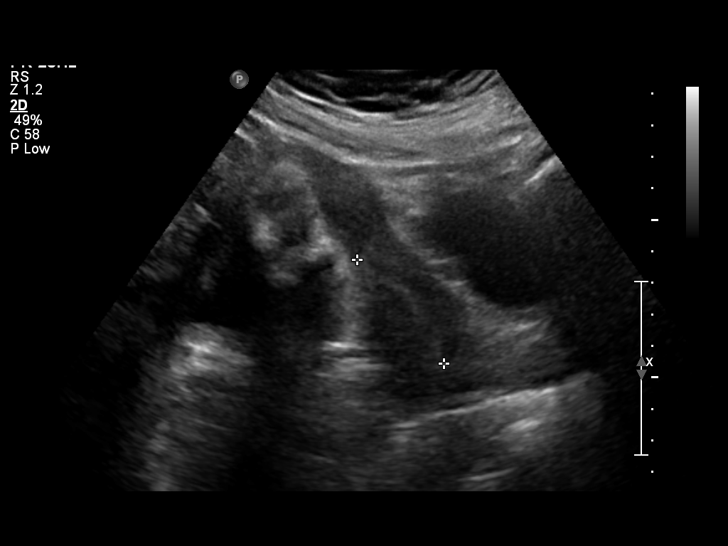
[im 41/46]
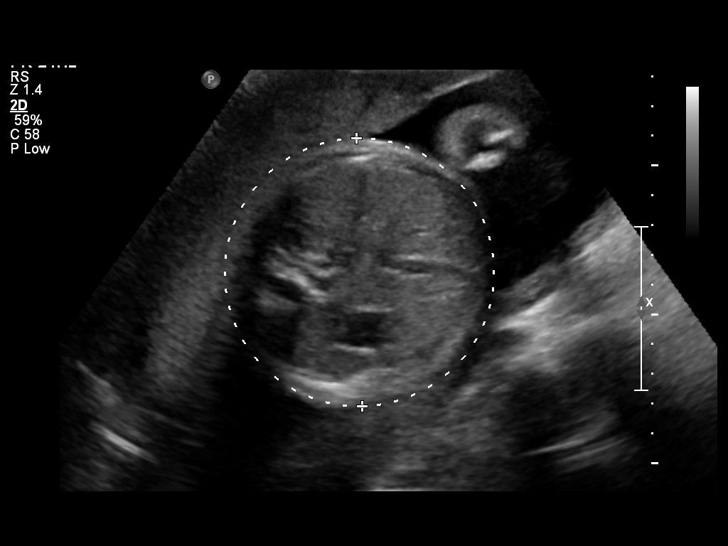
[im 44/46]
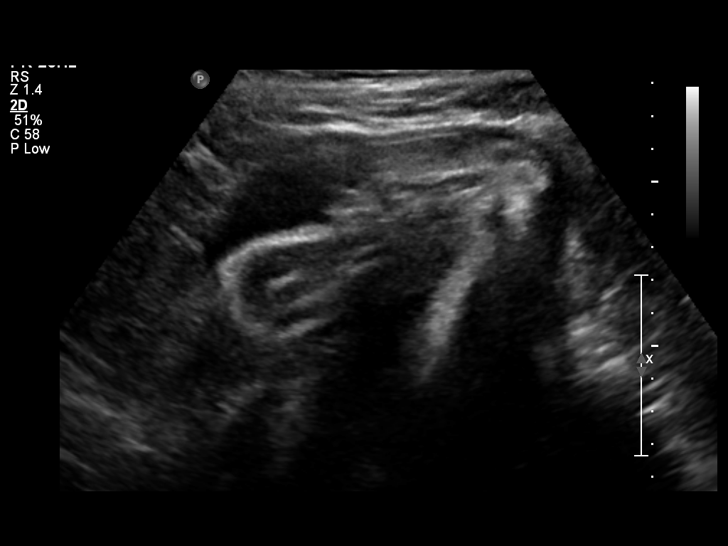

[12 of 28 positions shown; findings below may reference images not displayed]

OBSTETRICS REPORT
                      (Signed Final 02/07/2011 [DATE])

          FERIENHAUS

 Order#:         [id number]
Procedures

 US OB FOLLOW UP                                       76816.1
Indications

 Poor obstetric history: Previous preeclampsia
 Previous cesarean section @ 
36 weeks
 Morbid Obesity
 Follow-up fetal anatomic evaluation
Fetal Evaluation

 Fetal Heart Rate:  129                          bpm
 Cardiac Activity:  Observed
 Presentation:      Breech
 Placenta:          Anterior, above cervical os
 P. Cord Insertion: Not well visualized

 Amniotic Fluid
 AFI FV:      Subjectively within normal limits
 AFI Sum:     18.83   cm       70  %Tile      Larg Pckt:   6.84  cm
 RUQ:   4.26    cm   RLQ:    2.92   cm    LUQ:   4.81    cm   LLQ:    6.84   cm
Biometry

 BPD:     81.7  mm     G. Age:  32w 6d                CI:        69.47   70 - 86
                                                      FL/HC:       19.5  19.1 -

 HC:     312.9  mm     G. Age:  35w 1d       90  %    HC/AC:       1.11  0.96 -

 AC:     280.7  mm     G. Age:  32w 0d       56  %    FL/BPD:      74.8  71 - 87
 FL:      61.1  mm     G. Age:  31w 5d       34  %    FL/AC:       21.8  20 - 24

 Est. FW:    9500  gm      4 lb 5 oz     65  %
Gestational Age

 U/S Today:     32w 6d                                        EDD:   03/29/11
 Best:          31w 6d     Det. By:  Previous Ultrasound      EDD:   04/05/11
Anatomy

 Cranium:           Appears normal      Aortic Arch:       Not well
                                                           visualized
 Fetal Cavum:       Appears normal      Ductal Arch:       Not well
                                                           visualized
 Ventricles:        Appears normal      Diaphragm:         Previously seen
 Choroid Plexus:    Previously seen     Stomach:           Appears normal,
                                                           left sided
 Cerebellum:        Previously seen     Abdomen:           Appears normal
 Posterior Fossa:   Previously seen     Abdominal Wall:    Previously seen
 Nuchal Fold:       Not applicable      Cord Vessels:      Previously seen
                    (>20 wks GA)
 Face:              Previously seen     Kidneys:           Appear normal
 Heart:             Previously seen     Bladder:           Appears normal
 RVOT:              Not well            Spine:             Not well
                    visualized                             visualized
 LVOT:              Previously seen     Limbs:             Four extremities
                                                           previously seen

 Other:     Fetus appears to be a male. Right heel visualized.
            Technically difficult due to maternal habitus and fetal
            position.
Cervix Uterus Adnexa

 Cervical Length:    4.2       cm

 Cervix:       Normal appearance by transabdominal scan.

 Left Ovary:    Not visualized.
 Right Ovary:   Not visualized.

 Adnexa:     No abnormality visualized.
Impression

 Single intrauterine gestation demonstrating an estimated
 gestational age by ultrasound of 32w 6d. This is correlated
 with expected estimated gestational age by prior ultrasound of
 31w 6d. EFW is currently at the 65%.

 Anatomic evaluation of the fetal heart and spine remain limited
 by maternal habitus, advanced gestational age and fetal
 position. No late developing fetal anatomic abnormalities are
 seen associated with the lateral ventricles, stomach, kidneys
 or bladder.

 Subjectively and quantitatively normal amniotic fluid volume.

 Normal cervical length and appearance.

## 2012-08-24 ENCOUNTER — Emergency Department (HOSPITAL_COMMUNITY)
Admission: EM | Admit: 2012-08-24 | Discharge: 2012-08-24 | Disposition: A | Discharge status: Home / Self Care | Payer: Self-pay | Attending: Emergency Medicine | Admitting: Emergency Medicine

## 2012-08-24 ENCOUNTER — Encounter (HOSPITAL_COMMUNITY): Payer: Self-pay | Admitting: *Deleted

## 2012-08-24 DIAGNOSIS — H53149 Visual discomfort, unspecified: Secondary | ICD-10-CM | POA: Insufficient documentation

## 2012-08-24 DIAGNOSIS — Z8659 Personal history of other mental and behavioral disorders: Secondary | ICD-10-CM | POA: Insufficient documentation

## 2012-08-24 DIAGNOSIS — R51 Headache: Secondary | ICD-10-CM | POA: Insufficient documentation

## 2012-08-24 DIAGNOSIS — E669 Obesity, unspecified: Secondary | ICD-10-CM | POA: Insufficient documentation

## 2012-08-24 DIAGNOSIS — K089 Disorder of teeth and supporting structures, unspecified: Secondary | ICD-10-CM | POA: Insufficient documentation

## 2012-08-24 DIAGNOSIS — K0889 Other specified disorders of teeth and supporting structures: Secondary | ICD-10-CM

## 2012-08-24 DIAGNOSIS — Z87828 Personal history of other (healed) physical injury and trauma: Secondary | ICD-10-CM | POA: Insufficient documentation

## 2012-08-24 MED ORDER — METOCLOPRAMIDE HCL 5 MG/ML IJ SOLN
10.0000 mg | Freq: Once | INTRAMUSCULAR | Status: AC
  Administered 2012-08-24: 10 mg via INTRAVENOUS
  Filled 2012-08-24: qty 2

## 2012-08-24 MED ORDER — HYDROCODONE-ACETAMINOPHEN 5-325 MG PO TABS
2.0000 | ORAL_TABLET | Freq: Once | ORAL | Status: AC
  Administered 2012-08-24: 2 via ORAL
  Filled 2012-08-24: qty 2

## 2012-08-24 MED ORDER — DIPHENHYDRAMINE HCL 50 MG/ML IJ SOLN
25.0000 mg | Freq: Once | INTRAMUSCULAR | Status: AC
  Administered 2012-08-24: 25 mg via INTRAVENOUS
  Filled 2012-08-24: qty 1

## 2012-08-24 MED ORDER — SODIUM CHLORIDE 0.9 % IV SOLN
INTRAVENOUS | Status: DC
  Administered 2012-08-24: 08:00:00 via INTRAVENOUS

## 2012-08-24 MED ORDER — HYDROCODONE-ACETAMINOPHEN 5-325 MG PO TABS: 2.0000 | ORAL_TABLET | Freq: Four times a day (QID) | ORAL | Status: DC | PRN

## 2012-08-24 MED ORDER — PENICILLIN V POTASSIUM 500 MG PO TABS: 500.0000 mg | ORAL_TABLET | Freq: Three times a day (TID) | ORAL | Status: DC

## 2012-08-24 MED ORDER — KETOROLAC TROMETHAMINE 30 MG/ML IJ SOLN
30.0000 mg | Freq: Once | INTRAMUSCULAR | Status: AC
  Administered 2012-08-24: 30 mg via INTRAVENOUS
  Filled 2012-08-24: qty 1

## 2012-08-24 NOTE — ED Provider Notes (Signed)
Medical screening examination/treatment/procedure(s) were performed by non-physician practitioner and as supervising physician I was immediately available for consultation/collaboration.  Mousa Prout M Aloise Copus, MD 08/24/12 1816 

## 2012-08-24 NOTE — ED Notes (Signed)
Pt reports dental pain since yesterday

## 2012-08-24 NOTE — ED Notes (Signed)
Waiting for pts ride to get here.
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
## 2014-02-13 ENCOUNTER — Encounter (HOSPITAL_COMMUNITY): Payer: Self-pay | Admitting: *Deleted

## 2014-06-12 ENCOUNTER — Encounter (HOSPITAL_COMMUNITY): Payer: Self-pay | Admitting: *Deleted

## 2014-06-12 ENCOUNTER — Other Ambulatory Visit (HOSPITAL_COMMUNITY): Payer: Self-pay | Admitting: *Deleted

## 2014-06-12 DIAGNOSIS — N6310 Unspecified lump in the right breast, unspecified quadrant: Secondary | ICD-10-CM

## 2014-06-14 ENCOUNTER — Encounter (HOSPITAL_COMMUNITY): Payer: Self-pay | Admitting: Emergency Medicine

## 2014-06-14 ENCOUNTER — Emergency Department (INDEPENDENT_AMBULATORY_CARE_PROVIDER_SITE_OTHER)
Admission: EM | Admit: 2014-06-14 | Discharge: 2014-06-14 | Disposition: A | Discharge status: Home / Self Care | Payer: Self-pay | Source: Home / Self Care | Attending: Emergency Medicine | Admitting: Emergency Medicine

## 2014-06-14 ENCOUNTER — Emergency Department (HOSPITAL_COMMUNITY): Payer: Self-pay

## 2014-06-14 ENCOUNTER — Emergency Department (HOSPITAL_COMMUNITY)
Admission: EM | Admit: 2014-06-14 | Discharge: 2014-06-14 | Disposition: A | Discharge status: Home / Self Care | Payer: Self-pay | Attending: Emergency Medicine | Admitting: Emergency Medicine

## 2014-06-14 DIAGNOSIS — G4489 Other headache syndrome: Secondary | ICD-10-CM

## 2014-06-14 DIAGNOSIS — Z87440 Personal history of urinary (tract) infections: Secondary | ICD-10-CM | POA: Insufficient documentation

## 2014-06-14 DIAGNOSIS — H838X9 Other specified diseases of inner ear, unspecified ear: Secondary | ICD-10-CM

## 2014-06-14 DIAGNOSIS — H539 Unspecified visual disturbance: Secondary | ICD-10-CM

## 2014-06-14 DIAGNOSIS — Z3202 Encounter for pregnancy test, result negative: Secondary | ICD-10-CM | POA: Insufficient documentation

## 2014-06-14 DIAGNOSIS — R51 Headache: Secondary | ICD-10-CM | POA: Insufficient documentation

## 2014-06-14 DIAGNOSIS — R42 Dizziness and giddiness: Secondary | ICD-10-CM | POA: Insufficient documentation

## 2014-06-14 DIAGNOSIS — Z8659 Personal history of other mental and behavioral disorders: Secondary | ICD-10-CM | POA: Insufficient documentation

## 2014-06-14 DIAGNOSIS — H8190 Unspecified disorder of vestibular function, unspecified ear: Secondary | ICD-10-CM

## 2014-06-14 DIAGNOSIS — R519 Headache, unspecified: Secondary | ICD-10-CM

## 2014-06-14 DIAGNOSIS — E669 Obesity, unspecified: Secondary | ICD-10-CM | POA: Insufficient documentation

## 2014-06-14 LAB — POCT URINALYSIS DIP (DEVICE)
Bilirubin Urine: NEGATIVE
Glucose, UA: NEGATIVE mg/dL
Ketones, ur: NEGATIVE mg/dL
LEUKOCYTES UA: NEGATIVE
NITRITE: NEGATIVE
PH: 6 (ref 5.0–8.0)
PROTEIN: NEGATIVE mg/dL
Specific Gravity, Urine: >=1.03 (ref 1.005–1.030)
Urobilinogen, UA: 0.2 mg/dL (ref 0.0–1.0)

## 2014-06-14 LAB — POCT I-STAT, CHEM 8
BUN: 13 mg/dL (ref 6–23)
CHLORIDE: 102 mmol/L (ref 96–112)
CREATININE: 0.6 mg/dL (ref 0.50–1.10)
Calcium, Ion: 1.23 mmol/L (ref 1.12–1.23)
Glucose, Bld: 94 mg/dL (ref 70–99)
HCT: 46 % (ref 36.0–46.0)
Hemoglobin: 15.6 g/dL — ABNORMAL HIGH (ref 12.0–15.0)
POTASSIUM: 3.7 mmol/L (ref 3.5–5.1)
SODIUM: 141 mmol/L (ref 135–145)
TCO2: 23 mmol/L (ref 0–100)

## 2014-06-14 LAB — PREGNANCY, URINE: PREG TEST UR: NEGATIVE

## 2014-06-14 MED ORDER — METOCLOPRAMIDE HCL 5 MG/ML IJ SOLN
10.0000 mg | Freq: Once | INTRAMUSCULAR | Status: AC
  Administered 2014-06-14: 10 mg via INTRAVENOUS
  Filled 2014-06-14: qty 2

## 2014-06-14 MED ORDER — KETOROLAC TROMETHAMINE 30 MG/ML IJ SOLN
30.0000 mg | Freq: Once | INTRAMUSCULAR | Status: AC
  Administered 2014-06-14: 30 mg via INTRAVENOUS
  Filled 2014-06-14: qty 1

## 2014-06-14 MED ORDER — MECLIZINE HCL 25 MG PO TABS: 25.0000 mg | ORAL_TABLET | Freq: Three times a day (TID) | ORAL | Status: DC | PRN

## 2014-06-14 MED ORDER — DIPHENHYDRAMINE HCL 50 MG/ML IJ SOLN
25.0000 mg | Freq: Once | INTRAMUSCULAR | Status: AC
  Administered 2014-06-14: 25 mg via INTRAVENOUS
  Filled 2014-06-14: qty 1

## 2014-06-14 MED ORDER — SODIUM CHLORIDE 0.9 % IV BOLUS (SEPSIS)
1000.0000 mL | Freq: Once | INTRAVENOUS | Status: AC
  Administered 2014-06-14: 1000 mL via INTRAVENOUS

## 2014-06-14 MED ORDER — DEXAMETHASONE SODIUM PHOSPHATE 10 MG/ML IJ SOLN
10.0000 mg | Freq: Once | INTRAMUSCULAR | Status: AC
  Administered 2014-06-14: 10 mg via INTRAVENOUS
  Filled 2014-06-14: qty 1

## 2014-06-14 MED ORDER — TRAMADOL HCL 50 MG PO TABS: 50.0000 mg | ORAL_TABLET | Freq: Four times a day (QID) | ORAL | Status: DC | PRN

## 2014-06-14 NOTE — Discharge Instructions (Signed)
Dolor de cabeza general sin causa  (General Headache Without Cause)   EL dolor de cabeza es un dolor o malestar que se siente en la zona de la cabeza o del cuello. Puede no tener una causa especfica. Hay muchas causas y tipos de dolores de [place]. Los ms comunes son:   Cefalea tensional.  Cefaleas migraosas.  Cefalea en brotes.  Cefaleas diarias crnicas. INSTRUCCIONES PARA EL CUIDADO EN EL HOGAR   Cumpla con todas las citas programadas con su mdico o con el especialista al que lo hayan derivado.  Slo tome medicamentos de venta libre o recetados para Primary school teachercalmar el dolor o Environmental health practitionerel malestar, segn las indicaciones de su mdico.  Cuando sienta dolor de cabeza acustese en un cuarto oscuro y tranquilo.  Lleve un registro diario para Financial risk analystaveriguar lo que Press photographerpuede provocarlo. Por ejemplo, escriba:  Lo que come y bebe.  Cunto tiempo duerme.  Todo cambio en la dieta o medicamentos.  Intente con masajes u otras tcnicas de relajacin.  Colquese compresas de hielo o calor en la cabeza y en el cuello. selos 3 a 4 veces por da de 15 a 20 minutos por vez, o como sea necesario.  Limite las situaciones de estrs.  Sintese con la espalda recta y no  tense los msculos.  Si fuma, deje de hacerlo.  Limite el consumo de bebidas alcohlicas.  Consuma menos cantidad de cafena o deje de tomarla.  Coma y duerma en horarios regulares.  Duerma entre 7 y 9 horas o como lo indique su mdico.  DietitianMantenga las luces tenues si le molestan las luces brillantes y Hospital doctorempeoran el dolor de [place]abeza. SOLICITE ATENCIN MDICA SI:   Tiene problemas con los Arboriculturistmedicamentos que le recetaron.  Los medicamentos no Education officer, environmentalle hacen efecto.  El dolor de cabeza que senta habitualmente es diferente.  Tiene nuseas o vmitos. SOLICITE ATENCIN MDICA DE INMEDIATO SI:   El dolor se hace cada vez ms intenso.  Tiene fiebre.  Presenta rigidez en el cuello.  Sufre prdida de la visin.  Presenta debilidad muscular o prdida  del control muscular.  Comienza a perder el equilibrio o tiene problemas para caminar.  Sufre mareos o se desmaya.  Tiene sntomas graves que son diferentes a los primeros sntomas. ASEGRESE DE QUE:   Comprende estas instrucciones.  Controlar su enfermedad.  Solicitar ayuda de inmediato si no mejora o empeora. Document Released: 01/08/2005 Document Revised: 09/30/2011 [facility] Patient Information 2015 [place], [place]. This information is not intended to replace advice given to you by your health care provider. Make sure you discuss any questions you have with your health care provider.  Obesidad (Obesity) Se considera obesidad cuando hay demasiada grasa corporal y el ndice de masa corporal [facility]) es de 30 o ms. El [facility] es la estimacin de la grasa corporal y se calcula a partir de la altura y [place] peso. La obesidad se produce cuando se consumen ms caloras de las que se gastan realizando ejercicios o actividad fsica CarMaxtodos los das. Cuando la obesidad es [place]longada, puede causar enfermedades graves o Sports administratoremergencias, como:   Ictus.  Enfermedades cardacas.  Diabetes.  Cncer.  Artritis.  Presin arterial elevada (hipertensin).  Colesterol elevado.  Apnea del sueo.  Disfuncin erctil.  Problemas de infertilidad. CAUSAS   Comer habitualmente alimentos poco saludables.  Falta de actividad fsica.  Ciertos trastornos, por ejemplo, menor actividad de la tiroides (hipotiroidismo), sndrome de [place] y sndrome de ovario poliqustico.  Ciertos medicamentos, como los corticoides, algunos antidepresivos y antipsicticos.  Factores genticos.  Falta de sueo. DIAGNSTICO  El mdico podr diagnosticar obesidad despus de calcular el IMC. La obesidad se diagnostica cuando el IMC es de 30 o ms.  Hay otros mtodos para [employer] de obesidad. Otros mtodos consisten en medir el grosor de un pliegue de piel o la circunferencia de la cintura, y comparar la circunferencia  de la cadera con la circunferencia de la cintura. TRATAMIENTO  Un programa de tratamiento saludable incluye todos o algunos de los siguientes pasos:  Cambios en la alimentacin a Air cabin crew.  Ejercicios y [place] fsica.  Cambios en la conducta y en el estilo de vida.  Medicamentos (solo con la supervisin de su mdico). Los medicamentos pueden ser tiles, pero solamente si se usan con programas de dieta y ejercicio. Un programa poco saludable incluye:  Ayunos.  Dietas de moda.  Suplementos y frmacos. Estas elecciones no son exitosas para un control del peso a Air cabin crew.  INSTRUCCIONES PARA EL CUIDADO EN EL HOGAR   Haga ejercicio y [place] fsica segn las indicaciones de su mdico. Para aumentar la actividad fsica, trate lo siguiente:  Utilice las Microbiologist del Medical sales representative.  Estacione lejos de la entrada de las tiendas.  Arregle el jardn, ande en bicicleta o camine en vez de mirar televisin o usar la computadora.  Consuma alimentos y bebidas saludables, bajos en caloras, de manera habitual. Coma ms frutas y verduras frescas. Utilice un recetario de alimentos de bajas caloras o tome clases de cocina saludable.  Limite las comidas rpidas, los dulces y los snacks procesados.  Coma porciones ms pequeas.  Lleve un diario de todo lo que come. Hay muchas pginas web gratuitas que podrn ayudarlo. Puede ser til [employer] alimentos de modo que pueda determinar si est consumiendo el tamao adecuado de las porciones.  Evite el consumo alcohol. Beba ms agua y bebidas sin caloras.  Tome vitaminas y suplementos solamente como se lo haya recomendado su mdico.  Tambin pueden ser de gran ayuda los grupos de apoyo para perder peso y la orientacin de un nutricionista matriculado, un terapeuta y un programa de educacin para reducir Development worker, community. SOLICITE ATENCIN MDICA DE INMEDIATO SI:  Siente dolor u opresin en el pecho.  Siente dificultad para respirar o  Company secretary.  Siente debilidad o adormecimiento en las piernas.  Se siente confundido o tiene dificultad para hablar.  Tiene cambios repentinos en la visin. ASEGRESE DE QUE:  Comprende estas instrucciones.  Controlar su afeccin.  Recibir ayuda de inmediato si no mejora o si empeora. Document Released: 03/31/2005 Document Revised: 08/15/2013 [facility] Patient Information 2015 [place], [place]. This information is not intended to replace advice given to you by your health care provider. Make sure you discuss any questions you have with your health care provider.  Alteraciones Visuales (Visual Disturbances) Usted tiene problemas de visin. Existen muchas enfermedades que pueden ocasionar problemas de visin. Estos incluyen:  Migraas. Los Dolores de cabeza por migraas estn a menudo precedidos por disturbios en la visin. Son puntos ciegos o flashes de luz seguidos de un dolor de [place]. Este tipo de prdida visual es temporaria y no daa el ojo.  Glaucoma. Se debe a un incremento de la presin en el ojo. Los sntomas pueden ser: confusin, visin borrosa o la visin de crculos de color semejantes a un arco iris, al mirar luces brillantes. Puede haber prdida parcial o completa de la visin. Puede sentir o no dolor en el ojo. La  prdida de la visin puede ser gradual o repentina y es irreversible. El glaucoma es la causa principal de la ceguera.  Problemas de retina. La visin puede reducirse si se produce un desprendimiento de la retina o si hay un problema de circulacin, como diabetes, presin sangunea alta o un mini ictus. Los sntomas incluyen la visin de "objetos que flotan", flashes de luz o sombras, como si una cortina cayera sobre el ojo.  Problemas en el nervio ptico. Este nervio puede daarse por inflamacin, mala circulacin, drogas y toxinas. Es muy importante que un especialista realice un examen complete para determinar la causa exacta del problema de la visin.  El especialista podr recomendarle medicamentos o una ciruga, segn la causa del problema. Esto puede ayudar a Proofreader prdida de visin o reducir el riesgo de tener un accidente cerebrovascular. Contctese con el mdico al que se ha dirigido y Contractor de seguimiento. SOLICITE ATENCIN MDICA DE INMEDIATO SI:  La visin empeora.  Sufre un dolor de cabeza intenso.  Siente debilidad o adormecimiento [employer], los brazos o las piernas.  Tiene dificultad para hablar o para caminar. Document Released: 01/26/2007 Document Revised: 06/23/2011 [facility] Patient Information 2015 [place], [place]. This information is not intended to replace advice given to you by your health care provider. Make sure you discuss any questions you have with your health care provider.

## 2014-06-14 NOTE — Discharge Instructions (Signed)
Tramadol as prescribed as needed for headache.  Meclizine as prescribed as needed for dizziness.   Dolor de cabeza general sin causa  (General Headache Without Cause)   EL dolor de cabeza es un dolor o malestar que se siente en la zona de la cabeza o del cuello. Puede no tener una causa especfica. Hay muchas causas y tipos de dolores de Turkmenistancabeza. Los ms comunes son:   Cefalea tensional.  Cefaleas migraosas.  Cefalea en brotes.  Cefaleas diarias crnicas. INSTRUCCIONES PARA EL CUIDADO EN EL HOGAR   Cumpla con todas las citas programadas con su mdico o con el especialista al que lo hayan derivado.  Slo tome medicamentos de venta libre o recetados para Primary school teachercalmar el dolor o Environmental health practitionerel malestar, segn las indicaciones de su mdico.  Cuando sienta dolor de cabeza acustese en un cuarto oscuro y tranquilo.  Lleve un registro diario para Financial risk analystaveriguar lo que Press photographerpuede provocarlo. Por ejemplo, escriba:  Lo que come y bebe.  Cunto tiempo duerme.  Todo cambio en la dieta o medicamentos.  Intente con masajes u otras tcnicas de relajacin.  Colquese compresas de hielo o calor en la cabeza y en el cuello. selos 3 a 4 veces por da de 15 a 20 minutos por vez, o como sea necesario.  Limite las situaciones de estrs.  Sintese con la espalda recta y no  tense los msculos.  Si fuma, deje de hacerlo.  Limite el consumo de bebidas alcohlicas.  Consuma menos cantidad de cafena o deje de tomarla.  Coma y duerma en horarios regulares.  Duerma entre 7 y 9 horas o como lo indique su mdico.  DietitianMantenga las luces tenues si le molestan las luces brillantes y Hospital doctorempeoran el dolor de Turkmenistancabeza. SOLICITE ATENCIN MDICA SI:   Tiene problemas con los Arboriculturistmedicamentos que le recetaron.  Los medicamentos no Education officer, environmentalle hacen efecto.  El dolor de cabeza que senta habitualmente es diferente.  Tiene nuseas o vmitos. SOLICITE ATENCIN MDICA DE INMEDIATO SI:   El dolor se hace cada vez ms intenso.  Tiene  fiebre.  Presenta rigidez en el cuello.  Sufre prdida de la visin.  Presenta debilidad muscular o prdida del control muscular.  Comienza a perder el equilibrio o tiene problemas para caminar.  Sufre mareos o se desmaya.  Tiene sntomas graves que son diferentes a los primeros sntomas. ASEGRESE DE QUE:   Comprende estas instrucciones.  Controlar su enfermedad.  Solicitar ayuda de inmediato si no mejora o empeora. Document Released: 01/08/2005 Document Revised: 09/30/2011 [facility] Patient Information 2015 [place], [place]. This information is not intended to replace advice given to you by your health care provider. Make sure you discuss any questions you have with your health care provider.  Mareos (Dizziness) Los mareos son un problema muy frecuente. Se trata de una sensacin de inestabilidad o aturdimiento. Puede sentir que se va a desvanecer. Puede provocarle una lesin si se tropieza o se cae. Las Dealerpersonas de cualquier edad pueden sufrir mareos, pero es ms frecuente Teachers Insurance and Annuity Associationen los ancianos. CAUSAS  La causa puede deberse a muchos problemas diferentes:  Problemas en el odo medio.  Estar de pie FedExdurante mucho tiempo.  Infecciones.  Reacciones alrgicas.  Envejecimiento.  Respuesta emocional a distintas cosas, como por ejemplo la visin de sangre.  Efectos secundarios de Nature conservation officerun medicamento.  Cansancio.  Problemas circulatorios o de presin arterial.  Consumo excesivo de alcohol, medicamentos o drogas.  Respiracin muy rpida (hiperventilacin).  Ritmo cardaco irregular (arritmia).  Bajo recuento de glbulos rojos (anemia).  Embarazo.  Vmitos, diarrea, fiebre u otras enfermedades que causan la prdida de lquidos (deshidratacin).  Enfermedades o trastornos, como enfermedad de Parkinson, presin alta (hipertensin arterial), diabetes y problemas tiroideos.  Exposicin al calor extremo. DIAGNSTICO  El mdico le preguntar acerca de los sntomas, y Education officer, environmental  un examen fsico y un electrocardiograma (ECG) para registrar la actividad elctrica del corazn. Tambin podr realizarle otras pruebas cardacas o anlisis de sangre para determinar la causa de los [place]. Estos pueden ser:  Ecocardiograma transtorcico (ETT). Durante Management consultant, se usan ondas sonoras para evaluar cmo fluye la sangre por el corazn.  Ecocardiograma transesofgico (ETE).  Monitoreo cardaco. Este estudio permite que el mdico controle la frecuencia y el ritmo cardaco en tiempo real.  Monitor Holter. Es un dispositivo porttil que [employer] latidos cardacos y [place] a Education administrator las arritmias cardacas. Le permite al [employer] registrar la actividad cardaca durante varios das, si es necesario.  Pruebas de estrs por ejercicio o por medicamentos que aceleran los latidos cardacos. TRATAMIENTO  El tratamiento de los mareos depende de la causa de los sntomas y Advertising account planner. INSTRUCCIONES PARA EL CUIDADO EN EL HOGAR   Beba suficiente lquido para mantener la orina clara o de color amarillo plido. Esto es realmente importante cuando el clima es muy caluroso. En los adultos [place], tambin es importante cuando hace fro.  Tome los medicamentos exactamente como se lo hayan indicado, si los mareos se deben a los medicamentos. Cuando tome medicamentos para la presin arterial, es muy importante que se levante lentamente.  Levntese de las sillas con lentitud y apyese hasta sentirse bien.  Por la maana, sintese primero a un lado de la cama. Cuando se sienta bien, pngase lentamente de [address] se sostiene de algo, hasta que sepa que ha logrado el equilibrio.  Mueva las piernas con frecuencia si debe estar de pie en un lugar durante mucho tiempo. Mientras est de pie, contraiga y relaje los msculos de las piernas.  Pdale a alguna persona que lo acompae durante 1 o 2das si los mareos siguen siendo un problema. Debe estar acompaado hasta que se sienta  lo suficientemente bien como para estar solo. Pdale a la persona que llame al mdico si observa cambios en usted que sean preocupantes.  No conduzca vehculos ni utilice maquinarias pesadas si se siente mareado.  No beba alcohol. SOLICITE ATENCIN MDICA DE INMEDIATO SI:   Los mareos o el aturdimiento [place].  Siente nuseas o vomita.  Tiene dificultad para hablar, para caminar o para [employer], las manos o las piernas.  Se siente dbil.  No piensa con claridad o tiene dificultades para armar oraciones. Es posible que un amigo o un familiar adviertan que esto ocurre.  Tiene dolor de pecho, dolor abdominal, sudoracin o Company secretary.  Hay cambios en la visin.  Observa un sangrado.  Tiene efectos secundarios del medicamento que parecen Consulting civil engineer de Scientist, clinical (histocompatibility and immunogenetics). ASEGRESE DE QUE:   Comprende estas instrucciones.  Controlar su afeccin.  Recibir ayuda de inmediato si no mejora o si empeora. Document Released: 03/31/2005 Document Revised: 04/05/2013 [facility] Patient Information 2015 [place], [place]. This information is not intended to replace advice given to you by your health care provider. Make sure you discuss any questions you have with your health care provider.

## 2014-06-14 NOTE — ED Notes (Signed)
C/o dizziness and constant HA onset 1.5 weeks Also reports she was scared last night due to blurry vision Taking 6 tyle daily to ease pain w/no relief Alert, no signs of acute distress.
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
## 2014-06-14 NOTE — ED Notes (Signed)
Pt c/o headache x's 1 1/2 weeks, st's she has been taking Tylenol everyday without relief
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
## 2014-06-19 ENCOUNTER — Ambulatory Visit: Payer: Self-pay | Attending: Family Medicine | Admitting: Family Medicine

## 2014-06-19 VITALS — BP 110/80 | HR 72 | Temp 98.7°F | Resp 16 | Ht 63.0 in | Wt 363.5 lb

## 2014-06-19 DIAGNOSIS — R42 Dizziness and giddiness: Secondary | ICD-10-CM

## 2014-06-19 DIAGNOSIS — G44229 Chronic tension-type headache, not intractable: Secondary | ICD-10-CM

## 2014-06-19 DIAGNOSIS — Z6841 Body Mass Index (BMI) 40.0 and over, adult: Secondary | ICD-10-CM | POA: Insufficient documentation

## 2014-06-19 DIAGNOSIS — R519 Headache, unspecified: Secondary | ICD-10-CM | POA: Insufficient documentation

## 2014-06-19 DIAGNOSIS — R51 Headache: Secondary | ICD-10-CM | POA: Insufficient documentation

## 2014-06-19 MED ORDER — AMITRIPTYLINE HCL 50 MG PO TABS: 50.0000 mg | ORAL_TABLET | Freq: Every day | ORAL | Status: DC

## 2014-06-19 MED ORDER — NAPROXEN 500 MG PO TABS: 500.0000 mg | ORAL_TABLET | Freq: Two times a day (BID) | ORAL | Status: AC

## 2014-06-19 NOTE — Patient Instructions (Signed)
Take amitriptyline 50 and bedtime and take naproxen twice a day with meals for two weeks. Then take naproxen prn. Follow up with primary provider here in about 2 weeks.Mareos (Dizziness) Los mareos son un problema muy frecuente. Se trata de una sensacin de inestabilidad o aturdimiento. Puede sentir que se va a desvanecer. Puede provocarle una lesin si se tropieza o se cae. Las Dealer de cualquier edad pueden sufrir mareos, pero es ms frecuente [employer] ancianos. CAUSAS  La causa puede deberse a muchos problemas diferentes:  Problemas en el odo medio.  Estar de pie FedEx.  Infecciones.  Reacciones alrgicas.  Envejecimiento.  Respuesta emocional a distintas cosas, como por ejemplo la visin de sangre.  Efectos secundarios de Nature conservation officer.  Cansancio.  Problemas circulatorios o de presin arterial.  Consumo excesivo de alcohol, medicamentos o drogas.  Respiracin muy rpida (hiperventilacin).  Ritmo cardaco irregular (arritmia).  Bajo recuento de glbulos rojos (anemia).  Embarazo.  Vmitos, diarrea, fiebre u otras enfermedades que causan la prdida de lquidos (deshidratacin).  Enfermedades o trastornos, como enfermedad de Parkinson, presin alta (hipertensin arterial), diabetes y problemas tiroideos.  Exposicin al calor extremo. DIAGNSTICO  El mdico le preguntar acerca de los sntomas, y Education officer, environmental un examen fsico y un electrocardiograma (ECG) para registrar la actividad elctrica del corazn. Tambin podr realizarle otras pruebas cardacas o anlisis de sangre para determinar la causa de los [place]. Estos pueden ser:  Ecocardiograma transtorcico (ETT). Durante Management consultant, se usan ondas sonoras para evaluar cmo fluye la sangre por el corazn.  Ecocardiograma transesofgico (ETE).  Monitoreo cardaco. Este estudio permite que el mdico controle la frecuencia y el ritmo cardaco en tiempo real.  Monitor Holter. Es un dispositivo  porttil que eBay latidos cardacos y [place] a Education administrator las arritmias cardacas. Le permite al [employer] registrar la actividad cardaca durante varios das, si es necesario.  Pruebas de estrs por ejercicio o por medicamentos que aceleran los latidos cardacos. TRATAMIENTO  El tratamiento de los mareos depende de la causa de los sntomas y Advertising account planner. INSTRUCCIONES PARA EL CUIDADO EN EL HOGAR   Beba suficiente lquido para mantener la orina clara o de color amarillo plido. Esto es realmente importante cuando el clima es muy caluroso. En los adultos [place], tambin es importante cuando hace fro.  Tome los medicamentos exactamente como se lo hayan indicado, si los mareos se deben a los medicamentos. Cuando tome medicamentos para la presin arterial, es muy importante que se levante lentamente.  Levntese de las sillas con lentitud y apyese hasta sentirse bien.  Por la maana, sintese primero a un lado de la cama. Cuando se sienta bien, pngase lentamente de [address] se sostiene de algo, hasta que sepa que ha logrado el equilibrio.  Mueva las piernas con frecuencia si debe estar de pie en un lugar durante mucho tiempo. Mientras est de pie, contraiga y relaje los msculos de las piernas.  Pdale a alguna persona que lo acompae durante 1 o 2das si los mareos siguen siendo un problema. Debe estar acompaado hasta que se sienta lo suficientemente bien como para estar solo. Pdale a la persona que llame al mdico si observa cambios en usted que sean preocupantes.  No conduzca vehculos ni utilice maquinarias pesadas si se siente mareado.  No beba alcohol. SOLICITE ATENCIN MDICA DE INMEDIATO SI:   Los mareos o el aturdimiento [place].  Siente nuseas o vomita.  Tiene dificultad para hablar, para caminar o para Boeing, las [employer] Corporation  o las piernas.  Se siente dbil.  No piensa con claridad o tiene dificultades para armar oraciones. Es posible que un amigo o  un familiar adviertan que esto ocurre.  Tiene dolor de pecho, dolor abdominal, sudoracin o Company secretaryle falta el aire.  Hay cambios en la visin.  Observa un sangrado.  Tiene efectos secundarios del medicamento que parecen Consulting civil engineerempeorar en lugar de Scientist, clinical (histocompatibility and immunogenetics)mejorar. ASEGRESE DE QUE:   Comprende estas instrucciones.  Controlar su afeccin.  Recibir ayuda de inmediato si no mejora o si empeora. Document Released: 03/31/2005 Document Revised: 04/05/2013 [facility] Patient Information 2015 [place]ExitCare, MarylandLLC. This information is not intended to replace advice given to you by your health care provider. Make sure you discuss any questions you have with your health care provider. Dolor de cabeza general sin causa  (General Headache Without Cause)   EL dolor de cabeza es un dolor o malestar que se siente en la zona de la cabeza o del cuello. Puede no tener una causa especfica. Hay muchas causas y tipos de dolores de Turkmenistancabeza. Los ms comunes son:   Cefalea tensional.  Cefaleas migraosas.  Cefalea en brotes.  Cefaleas diarias crnicas. INSTRUCCIONES PARA EL CUIDADO EN EL HOGAR   Cumpla con todas las citas programadas con su mdico o con el especialista al que lo hayan derivado.  Slo tome medicamentos de venta libre o recetados para Primary school teachercalmar el dolor o Environmental health practitionerel malestar, segn las indicaciones de su mdico.  Cuando sienta dolor de cabeza acustese en un cuarto oscuro y tranquilo.  Lleve un registro diario para Financial risk analystaveriguar lo que Press photographerpuede provocarlo. Por ejemplo, escriba:  Lo que come y bebe.  Cunto tiempo duerme.  Todo cambio en la dieta o medicamentos.  Intente con masajes u otras tcnicas de relajacin.  Colquese compresas de hielo o calor en la cabeza y en el cuello. selos 3 a 4 veces por da de 15 a 20 minutos por vez, o como sea necesario.  Limite las situaciones de estrs.  Sintese con la espalda recta y no  tense los msculos.  Si fuma, deje de hacerlo.  Limite el consumo de bebidas  alcohlicas.  Consuma menos cantidad de cafena o deje de tomarla.  Coma y duerma en horarios regulares.  Duerma entre 7 y 9 horas o como lo indique su mdico.  DietitianMantenga las luces tenues si le molestan las luces brillantes y Hospital doctorempeoran el dolor de Turkmenistancabeza. SOLICITE ATENCIN MDICA SI:   Tiene problemas con los Arboriculturistmedicamentos que le recetaron.  Los medicamentos no Education officer, environmentalle hacen efecto.  El dolor de cabeza que senta habitualmente es diferente.  Tiene nuseas o vmitos. SOLICITE ATENCIN MDICA DE INMEDIATO SI:   El dolor se hace cada vez ms intenso.  Tiene fiebre.  Presenta rigidez en el cuello.  Sufre prdida de la visin.  Presenta debilidad muscular o prdida del control muscular.  Comienza a perder el equilibrio o tiene problemas para caminar.  Sufre mareos o se desmaya.  Tiene sntomas graves que son diferentes a los primeros sntomas. ASEGRESE DE QUE:   Comprende estas instrucciones.  Controlar su enfermedad.  Solicitar ayuda de inmediato si no mejora o empeora. Document Released: 01/08/2005 Document Revised: 09/30/2011 [facility] Patient Information 2015 [place]ourExitCare, MarylandLLC. This information is not intended to replace advice given to you by your health care provider. Make sure you discuss any questions you have with your health care provider.

## 2014-06-19 NOTE — Progress Notes (Signed)
Patient is a morbidly obese Hispanic female who presents with headache. She had just been seen at urgent care, then comes here so that she can have further testing. Her neurologic exam is nonfocal and CT scan of her head is negative. She was given a migraine cocktail and IV fluids and is feeling somewhat better. I find no emergent pathology and feel as though she is appropriate for discharge. I will prescribe tramadol for her headache and meclizine which she can take for her dizziness. She will be discharged to home, to follow-up as needed.  Patient presents today to establish care.  She reports having a constant headache for the last month with dizziness and cold chills.  Patient reports she had a "worm in her brain" years ago [place] and she feels the same way now.  She is not able to report much other than she had this and she is not sure how they determined what was wrong with her.

## 2014-06-19 NOTE — Assessment & Plan Note (Signed)
Subjectively she is alert oriented and appropriate "morbidly obese female who seems in no acute distress at this time. Skin is warm and dry. Cranial nerves II through XII intact. Pupils are equal and reactive. EOMs intact grips are strong and equal. She has full range of all of her extremities. Her gait is a little wobbly seeming to meet more related to the way and anything else.   Plan I have prescribed amitriptyline 50 mg to be taken at bedtime nightly prescribed #30 and also prescribed naproxen 500 mg #30 one to be taken twice a day with food on a regular basis for the next couple weeks. She is to make an appointment with a primary provider for she runs out of the naproxen or in about 2 weeks.

## 2014-06-21 ENCOUNTER — Encounter (HOSPITAL_COMMUNITY): Payer: Self-pay | Admitting: *Deleted

## 2014-06-22 ENCOUNTER — Ambulatory Visit
Admission: RE | Admit: 2014-06-22 | Discharge: 2014-06-22 | Disposition: A | Discharge status: Home / Self Care | Payer: No Typology Code available for payment source | Source: Ambulatory Visit | Attending: Obstetrics and Gynecology | Admitting: Obstetrics and Gynecology

## 2014-06-22 ENCOUNTER — Ambulatory Visit (HOSPITAL_COMMUNITY)
Admission: RE | Admit: 2014-06-22 | Discharge: 2014-06-22 | Disposition: A | Discharge status: Home / Self Care | Payer: Self-pay | Source: Ambulatory Visit | Attending: Obstetrics and Gynecology | Admitting: Obstetrics and Gynecology

## 2014-06-22 ENCOUNTER — Encounter (HOSPITAL_COMMUNITY): Payer: Self-pay

## 2014-06-22 VITALS — BP 114/72 | Temp 98.0°F | Ht 63.0 in | Wt 366.2 lb

## 2014-06-22 DIAGNOSIS — N6310 Unspecified lump in the right breast, unspecified quadrant: Secondary | ICD-10-CM

## 2014-06-22 DIAGNOSIS — N6315 Unspecified lump in the right breast, overlapping quadrants: Secondary | ICD-10-CM

## 2014-06-22 DIAGNOSIS — Z1239 Encounter for other screening for malignant neoplasm of breast: Secondary | ICD-10-CM

## 2014-06-22 DIAGNOSIS — N6452 Nipple discharge: Secondary | ICD-10-CM

## 2014-06-22 NOTE — Addendum Note (Signed)
Encounter addended by: Lynnell DikeSabrina H Kandyce Dieguez, LPN on: 1/61/09603/01/2015  2:29 PM<BR>     Documentation filed: [employer] Association, Orders

## 2014-06-22 NOTE — Progress Notes (Addendum)
Complaints of right breast lump x 2 months that has a pulsing pain at at times. Patient rates pain at a 6 out of 10.  Pap Smear:  Pap smear not completed today. [facility] and normal. Per patient has no history of an abnormal Pap smear. Last Pap smear result is scanned into EPIC under media.  Physical exam: Breasts Breasts symmetrical. No skin abnormalities bilateral breasts. No nipple retraction bilateral breasts. Bilateral milky breast discharge that occurs spontaneously within the right and when expresses the left. A sample of the right breast discharge sent to cytology. Unable to express enough discharge to send a sample of the left. No lymphadenopathy. No lumps palpated left breast. Palpated a pea sized lump within the right breast at 3 o'clock 5 cm from the nipple. Complaints of tenderness when palpated lump within the right breast. [facility]. Appointment scheduled for Thursday, June 22, 2014 at 1120.   Pelvic/Bimanual No Pap smear completed today since last Pap smear was 08/17/2012. Pap smear not indicated per BCCCP guidelines.   Used interpreter Nira ConnJulia Sowell.

## 2014-06-22 NOTE — Addendum Note (Signed)
Encounter addended by: Priscille Heidelberghristine P Brannock, RN on: 06/22/2014  2:33 PM<BR>     Documentation filed: Notes Section

## 2014-06-22 NOTE — Patient Instructions (Addendum)
Education materials given on breast awareness. Explained to Baker Hughes IncorporatedSarai De La Rosa Mitchell that she did not need a Pap smear today due to last Pap smear was in 08/17/2012. Let her know BCCCP will cover Pap smears every 3 years unless has a history of abnormal Pap smears. [facility] Appointment scheduled for Thursday, June 22, 2014 at 1120. Patient aware of appointment and will be there. Let patient know will follow up with her within the next couple weeks with results of breast discharge by phone. Stacie De La BonnieRosa Mitchell verbalized understanding.  Brannock, Kathaleen Maserhristine Poll, RN 11:25 AM

## 2014-06-26 ENCOUNTER — Telehealth (HOSPITAL_COMMUNITY): Payer: Self-pay | Admitting: *Deleted

## 2014-06-26 NOTE — Telephone Encounter (Signed)
Called patient with interpreter Stacie Mitchell to give results of right breast discharge. Let her know the results were benign and per Dr. Luisa Hartornett no follow-up needed at this time unless it continues. Told patient to call me if the discharge continues. Patient verbalized understanding.

## 2014-06-29 ENCOUNTER — Ambulatory Visit (HOSPITAL_COMMUNITY)
Admission: EM | Admit: 2014-06-29 | Discharge: 2014-06-29 | Disposition: A | Discharge status: Home / Self Care | Payer: No Typology Code available for payment source | Attending: Internal Medicine | Admitting: Internal Medicine

## 2014-06-29 ENCOUNTER — Ambulatory Visit: Payer: Self-pay | Attending: Internal Medicine | Admitting: Internal Medicine

## 2014-06-29 ENCOUNTER — Ambulatory Visit (HOSPITAL_COMMUNITY)
Admit: 2014-06-29 | Discharge: 2014-06-29 | Disposition: A | Discharge status: Home / Self Care | Payer: No Typology Code available for payment source | Attending: Internal Medicine | Admitting: Internal Medicine

## 2014-06-29 ENCOUNTER — Encounter: Payer: Self-pay | Admitting: Internal Medicine

## 2014-06-29 VITALS — BP 143/88 | HR 101 | Temp 98.0°F | Resp 16 | Wt 361.8 lb

## 2014-06-29 DIAGNOSIS — R1031 Right lower quadrant pain: Secondary | ICD-10-CM | POA: Insufficient documentation

## 2014-06-29 DIAGNOSIS — Z793 Long term (current) use of hormonal contraceptives: Secondary | ICD-10-CM | POA: Insufficient documentation

## 2014-06-29 DIAGNOSIS — M25551 Pain in right hip: Secondary | ICD-10-CM

## 2014-06-29 DIAGNOSIS — R030 Elevated blood-pressure reading, without diagnosis of hypertension: Secondary | ICD-10-CM | POA: Insufficient documentation

## 2014-06-29 DIAGNOSIS — Z791 Long term (current) use of non-steroidal anti-inflammatories (NSAID): Secondary | ICD-10-CM | POA: Insufficient documentation

## 2014-06-29 DIAGNOSIS — G44229 Chronic tension-type headache, not intractable: Secondary | ICD-10-CM

## 2014-06-29 DIAGNOSIS — IMO0001 Reserved for inherently not codable concepts without codable children: Secondary | ICD-10-CM

## 2014-06-29 DIAGNOSIS — F329 Major depressive disorder, single episode, unspecified: Secondary | ICD-10-CM | POA: Insufficient documentation

## 2014-06-29 DIAGNOSIS — Z139 Encounter for screening, unspecified: Secondary | ICD-10-CM

## 2014-06-29 LAB — COMPLETE METABOLIC PANEL WITH GFR
ALK PHOS: 71 U/L (ref 39–117)
ALT: 24 U/L (ref 0–35)
AST: 22 U/L (ref 0–37)
Albumin: 3.7 g/dL (ref 3.5–5.2)
BILIRUBIN TOTAL: 0.5 mg/dL (ref 0.2–1.2)
BUN: 11 mg/dL (ref 6–23)
CO2: 22 meq/L (ref 19–32)
CREATININE: 0.61 mg/dL (ref 0.50–1.10)
Calcium: 8.9 mg/dL (ref 8.4–10.5)
Chloride: 106 mEq/L (ref 96–112)
GFR, Est African American: >89 mL/min
GLUCOSE: 99 mg/dL (ref 70–99)
Potassium: 3.9 mEq/L (ref 3.5–5.3)
Sodium: 139 mEq/L (ref 135–145)
Total Protein: 6.8 g/dL (ref 6.0–8.3)

## 2014-06-29 NOTE — Progress Notes (Signed)
Patient here for follow up on her headaches and dizziness States the medication helps but still having symptoms Patient also complains of having lower right abd pain for the past month

## 2014-06-29 NOTE — Patient Instructions (Addendum)
Plan de alimentacin DASH (DASH Eating Plan) DASH es la sigla en ingls de "Enfoques Alimentarios para Detener la Hipertensin". El plan de alimentacin DASH ha demostrado bajar la presin arterial elevada (hipertensin). Los beneficios adicionales para la salud pueden incluir la disminucin del riesgo de diabetes mellitus tipo2, enfermedades cardacas e ictus. Este plan tambin puede ayudar a adelgazar. QU DEBO SABER ACERCA DEL PLAN DE ALIMENTACIN DASH? Para el plan de alimentacin DASH, seguir las siguientes pautas generales:  Elija los alimentos con un valor porcentual diario de sodio de menos del 5% (segn figura en la etiqueta del alimento).  Use hierbas o aderezos sin sal, en lugar de sal de mesa o sal marina.  Consulte al mdico o farmacutico antes de usar sustitutos de la sal.  Coma productos con bajo contenido de sodio, cuya etiqueta suele decir "bajo contenido de sodio" o "sin agregado de sal".  Coma alimentos frescos.  Coma ms verduras, frutas y productos lcteos con bajo contenido de grasas.  Elija los cereales integrales. Busque la palabra "integral" en el primer lugar de la lista de ingredientes.  Elija el pescado y el pollo o el pavo sin piel ms a menudo que las carnes rojas. Limite el consumo de pescado, carne de ave y carne a 6onzas (170g) por da.  Limite el consumo de dulces, postres, azcares y bebidas azucaradas.  Elija las grasas saludables para el corazn.  Limite el consumo de queso a 1onza (28g) por da.  Consuma ms comida casera y menos de restaurante, de buf y comida rpida.  Limite el consumo de alimentos fritos.  Cocine los alimentos utilizando mtodos que no sean la fritura.  Limite las verduras enlatadas. Si las consume, enjuguelas bien para disminuir el sodio.  Cuando coma en un restaurante, pida que preparen su comida con menos sal o, en lo posible, sin nada de sal. QU ALIMENTOS PUEDO COMER? Pida ayuda a un nutricionista para  conocer las necesidades calricas individuales. Cereales Pan de salvado o integral. Arroz integral. Pastas de salvado o integrales. Quinua, trigo burgol y cereales integrales. Cereales con bajo contenido de sodio. Tortillas de harina de maz o de salvado. Pan de maz integral. Galletas saladas integrales. Galletas con bajo contenido de sodio. Vegetales Verduras frescas o congeladas (crudas, al vapor, asadas o grilladas). Jugos de tomate y verduras con contenido bajo o reducido de sodio. Pasta y salsa de tomate con contenido bajo o reducido de sodio. Verduras enlatadas con bajo contenido de sodio o reducido de sodio.  Frutas Frutas frescas, en conserva (en su jugo natural) o frutas congeladas. Carnes y otros productos con protenas Carne de res molida (al 85% o ms magra), carne de res de animales alimentados con pastos o carne de res sin la grasa. Pollo o pavo sin piel. Carne de pollo o de pavo molida. Cerdo sin la grasa. Todos los pescados y frutos de mar. Huevos. Porotos, guisantes o lentejas secos. Frutos secos y semillas sin sal. Frijoles enlatados sin sal. Lcteos Productos lcteos con bajo contenido de grasas, como leche descremada o al 1%, quesos reducidos en grasas o al 2%, ricota con bajo contenido de grasas o queso cottage, o yogur natural con bajo contenido de grasas. Quesos con contenido bajo o reducido de sodio. Grasas y aceites Margarinas en barra que no contengan grasas trans. Mayonesa y alios para ensaladas livianos o reducidos en grasas (reducidos en sodio). [place]. Aceites de crtamo, oliva o canola. Mantequilla natural de man o almendra. Otros Palomitas de maz y pretzels sin sal.   Los artculos mencionados arriba pueden no ser una lista completa de las bebidas o los alimentos recomendados. Comunquese con el nutricionista para conocer ms opciones. QU ALIMENTOS NO SE RECOMIENDAN? Cereales Pan blanco. Pastas blancas. Arroz blanco. Pan de maz refinado. Bagels y  croissants. Galletas saladas que contengan grasas trans. Vegetales Vegetales con crema o fritos. Verduras en salsa de queso. Verduras enlatadas comunes. Pasta y salsa de tomate en lata comunes. Jugos comunes de tomate y de verduras. Frutas Frutas secas. Fruta enlatada en almbar liviano o espeso. Jugo de frutas. Carnes y otros productos con protenas [place] de carne con grasa. Costillas, alas de pollo, tocineta, salchicha, mortadela, salame, chinchulines, tocino, perros calientes, salchichas alemanas y embutidos envasados. Frutos secos y semillas con sal. Frijoles con sal en lata. Lcteos Leche entera o al 2%, crema, mezcla de leche y crema, y queso crema. Yogur entero o endulzado. Quesos o queso azul con alto contenido de grasas. Cremas no lcteas y coberturas batidas. Quesos procesados, quesos para untar o cuajadas. Condimentos Sal de cebolla y ajo, sal condimentada, sal de mesa y sal marina. Salsas en lata y envasadas. Salsa Worcestershire. Salsa trtara. Salsa barbacoa. Salsa teriyaki. Salsa de soja, incluso la que tiene contenido reducido de sodio. Salsa de carne. Salsa de pescado. Salsa de ostras. Salsa rosada. Rbano picante. Ketchup y mostaza. Saborizantes y tiernizantes para carne. Caldo en cubitos. Salsa picante. Salsa tabasco. Adobos. Aderezos para tacos. Salsas. Grasas y aceites Mantequilla, margarina en barra, manteca de cerdo, grasa, mantequilla clarificada y grasa de tocino. Aceites de coco, de palmiste o de palma. Aderezos comunes para ensalada. Otros Pickles y aceitunas. Palomitas de maz y pretzels con sal. Los artculos mencionados arriba pueden no ser una lista completa de las bebidas y los alimentos que se deben evitar. Comunquese con el nutricionista para obtener ms informacin. DNDE PUEDO ENCONTRAR MS INFORMACIN? Instituto Nacional del Corazn, del Pulmn y de la Sangre (National Heart, Lung, and Blood Institute):  www.nhlbi.nih.gov/health/health-topics/topics/dash/ Document Released: 03/20/2011 Document Revised: 08/15/2013 ExitCare Patient Information 2015 ExitCare, LLC. This information is not intended to replace advice given to you by your health care provider. Make sure you discuss any questions you have with your health care provider.   

## 2014-06-29 NOTE — Progress Notes (Signed)
Patient unable to give urine sample in office  Will send patient home with specimen cup and she can drop it  Back at the office for u/a

## 2014-06-29 NOTE — ED Notes (Signed)
Registration an error patient went to x-ray.
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
## 2014-06-30 LAB — URINALYSIS, COMPLETE
Bilirubin Urine: NEGATIVE
Casts: NONE SEEN
Crystals: NONE SEEN
GLUCOSE, UA: NEGATIVE mg/dL
Ketones, ur: NEGATIVE mg/dL
Leukocytes, UA: NEGATIVE
Nitrite: NEGATIVE
PROTEIN: NEGATIVE mg/dL
Specific Gravity, Urine: 1.013 (ref 1.005–1.030)
UROBILINOGEN UA: 0.2 mg/dL (ref 0.0–1.0)
pH: 6 (ref 5.0–8.0)

## 2014-06-30 LAB — HEMOGLOBIN A1C
HEMOGLOBIN A1C: 5.8 % — AB (ref <5.7)
MEAN PLASMA GLUCOSE: 120 mg/dL — AB (ref <117)

## 2014-06-30 LAB — VITAMIN D 25 HYDROXY (VIT D DEFICIENCY, FRACTURES): VIT D 25 HYDROXY: 10 ng/mL — AB (ref 30–100)

## 2014-07-04 ENCOUNTER — Telehealth: Payer: Self-pay

## 2014-07-04 DIAGNOSIS — R319 Hematuria, unspecified: Secondary | ICD-10-CM

## 2014-07-04 MED ORDER — VITAMIN D (ERGOCALCIFEROL) 1.25 MG (50000 UNIT) PO CAPS: 50000.0000 [IU] | ORAL_CAPSULE | ORAL | Status: DC

## 2014-07-04 NOTE — Telephone Encounter (Signed)
Patient is aware of her x ray results If pain still persists we can refer to a specialist

## 2014-07-04 NOTE — Telephone Encounter (Signed)
-----   Message from Doris Cheadleeepak Advani, MD sent at 06/30/2014  9:29 AM EDT ----- Call and let the patient know that her x-ray of right hip is reported to be normal.

## 2014-07-04 NOTE — Telephone Encounter (Signed)
-----   Message from Doris Cheadleeepak Advani, MD sent at 06/30/2014  9:22 AM EDT ----- Blood work reviewed, noticed low vitamin D, call patient advise to start ergocalciferol 50,000 units once a week for the duration of  12 weeks. Blood work reviewed noticed hemoglobin A1c of  5.8%, patient has prediabetes, call and advise patient for low carbohydrate diet. Urine analysis is negative for infection but has hematuria , advise patient to come back in 2 weeks for repeat lab( UA)

## 2014-07-04 NOTE — Telephone Encounter (Signed)
Patient is aware of her lab results Future order placed for urinalysis Patient will stop in office to pick up sample cup to be dropped off

## 2014-07-13 ENCOUNTER — Ambulatory Visit: Payer: No Typology Code available for payment source | Attending: Internal Medicine

## 2014-08-07 ENCOUNTER — Ambulatory Visit: Payer: Self-pay | Attending: Internal Medicine | Admitting: Internal Medicine

## 2014-08-07 ENCOUNTER — Encounter: Payer: Self-pay | Admitting: Internal Medicine

## 2014-08-07 VITALS — BP 123/83 | HR 94 | Temp 98.0°F | Resp 16 | Wt 361.6 lb

## 2014-08-07 DIAGNOSIS — R319 Hematuria, unspecified: Secondary | ICD-10-CM | POA: Insufficient documentation

## 2014-08-07 DIAGNOSIS — G44229 Chronic tension-type headache, not intractable: Secondary | ICD-10-CM | POA: Insufficient documentation

## 2014-08-07 DIAGNOSIS — R7309 Other abnormal glucose: Secondary | ICD-10-CM | POA: Insufficient documentation

## 2014-08-07 DIAGNOSIS — R42 Dizziness and giddiness: Secondary | ICD-10-CM | POA: Insufficient documentation

## 2014-08-07 DIAGNOSIS — E559 Vitamin D deficiency, unspecified: Secondary | ICD-10-CM | POA: Insufficient documentation

## 2014-08-07 DIAGNOSIS — R7303 Prediabetes: Secondary | ICD-10-CM

## 2014-08-07 MED ORDER — AMITRIPTYLINE HCL 50 MG PO TABS: 50.0000 mg | ORAL_TABLET | Freq: Every day | ORAL | Status: DC

## 2014-08-07 MED ORDER — MECLIZINE HCL 25 MG PO TABS: 25.0000 mg | ORAL_TABLET | Freq: Three times a day (TID) | ORAL | Status: DC | PRN
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
## 2014-08-07 NOTE — Progress Notes (Signed)
Patient here for follow up Complains of still having some dizziness and would like a refill on her antivert Patient also dropped off her urine sample for her urinalysis

## 2014-08-07 NOTE — Patient Instructions (Signed)
Diabetes Mellitus and Food It is important for you to manage your blood sugar (glucose) level. Your blood glucose level can be greatly affected by what you eat. Eating healthier foods in the appropriate amounts throughout the day at about the same time each day will help you control your blood glucose level. It can also help slow or prevent worsening of your diabetes mellitus. Healthy eating may even help you improve the level of your blood pressure and reach or maintain a healthy weight.  HOW CAN FOOD AFFECT ME? Carbohydrates Carbohydrates affect your blood glucose level more than any other type of food. Your dietitian will help you determine how many carbohydrates to eat at each meal and teach you how to count carbohydrates. Counting carbohydrates is important to keep your blood glucose at a healthy level, especially if you are using insulin or taking certain medicines for diabetes mellitus. Alcohol Alcohol can cause sudden decreases in blood glucose (hypoglycemia), especially if you use insulin or take certain medicines for diabetes mellitus. Hypoglycemia can be a life-threatening condition. Symptoms of hypoglycemia (sleepiness, dizziness, and disorientation) are similar to symptoms of having too much alcohol.  If your health care provider has given you approval to drink alcohol, do so in moderation and use the following guidelines:  Women should not have more than one drink per day, and men should not have more than two drinks per day. One drink is equal to:  12 oz of beer.  5 oz of wine.  1 oz of hard liquor.  Do not drink on an empty stomach.  Keep yourself hydrated. Have water, diet soda, or unsweetened iced tea.  Regular soda, juice, and other mixers might contain a lot of carbohydrates and should be counted. WHAT FOODS ARE NOT RECOMMENDED? As you make food choices, it is important to remember that all foods are not the same. Some foods have fewer nutrients per serving than other  foods, even though they might have the same number of calories or carbohydrates. It is difficult to get your body what it needs when you eat foods with fewer nutrients. Examples of foods that you should avoid that are high in calories and carbohydrates but low in nutrients include:  Trans fats (most processed foods list trans fats on the Nutrition Facts label).  Regular soda.  Juice.  Candy.  Sweets, such as cake, pie, doughnuts, and cookies.  Fried foods. WHAT FOODS CAN I EAT? Have nutrient-rich foods, which will nourish your body and keep you healthy. The food you should eat also will depend on several factors, including:  The calories you need.  The medicines you take.  Your weight.  Your blood glucose level.  Your blood pressure level.  Your cholesterol level. You also should eat a variety of foods, including:  Protein, such as meat, poultry, fish, tofu, nuts, and seeds (lean animal proteins are best).  Fruits.  Vegetables.  Dairy products, such as milk, cheese, and yogurt (low fat is best).  Breads, grains, pasta, cereal, rice, and beans.  Fats such as olive oil, trans fat-free margarine, canola oil, avocado, and olives. DOES EVERYONE WITH DIABETES MELLITUS HAVE THE SAME MEAL PLAN? Because every person with diabetes mellitus is different, there is not one meal plan that works for everyone. It is very important that you meet with a dietitian who will help you create a meal plan that is just right for you. Document Released: 12/26/2004 Document Revised: 04/05/2013 Document Reviewed: 02/25/2013 ExitCare Patient Information 2015 ExitCare, [employer]. This   information is not intended to replace advice given to you by your health care provider. Make sure you discuss any questions you have with your health care provider.  

## 2014-08-08 ENCOUNTER — Telehealth: Payer: Self-pay

## 2014-08-08 LAB — URINALYSIS, COMPLETE
Bilirubin Urine: NEGATIVE
CASTS: NONE SEEN
CRYSTALS: NONE SEEN
GLUCOSE, UA: NEGATIVE mg/dL
HGB URINE DIPSTICK: NEGATIVE
LEUKOCYTES UA: NEGATIVE
Nitrite: NEGATIVE
PH: 5.5 (ref 5.0–8.0)
PROTEIN: 30 mg/dL — AB
Specific Gravity, Urine: 1.024 (ref 1.005–1.030)
UROBILINOGEN UA: 0.2 mg/dL (ref 0.0–1.0)

## 2014-08-08 MED ORDER — VITAMIN D (ERGOCALCIFEROL) 1.25 MG (50000 UNIT) PO CAPS: 50000.0000 [IU] | ORAL_CAPSULE | ORAL | Status: DC

## 2014-08-08 NOTE — Telephone Encounter (Signed)
-----   Message from Doris Cheadleeepak Advani, MD sent at 08/08/2014  9:29 AM EDT ----- Call and let the patient know that her urine does not show any more blood

## 2014-08-08 NOTE — Telephone Encounter (Signed)
Interpreter line used Vernon[place] LouisianaID #[id number] Patient is aware of her lab results and urine results and to pick  Up her prescription at the pharmacy

## 2014-12-14 ENCOUNTER — Ambulatory Visit: Payer: Medicaid Other | Attending: Family Medicine | Admitting: Family Medicine

## 2014-12-14 ENCOUNTER — Encounter: Payer: Self-pay | Admitting: Family Medicine

## 2014-12-14 VITALS — BP 104/72 | HR 72 | Temp 98.1°F | Ht 60.0 in | Wt 362.0 lb

## 2014-12-14 DIAGNOSIS — R7309 Other abnormal glucose: Secondary | ICD-10-CM

## 2014-12-14 DIAGNOSIS — L298 Other pruritus: Secondary | ICD-10-CM | POA: Diagnosis not present

## 2014-12-14 DIAGNOSIS — B353 Tinea pedis: Secondary | ICD-10-CM | POA: Insufficient documentation

## 2014-12-14 DIAGNOSIS — G44229 Chronic tension-type headache, not intractable: Secondary | ICD-10-CM

## 2014-12-14 DIAGNOSIS — R234 Changes in skin texture: Secondary | ICD-10-CM | POA: Diagnosis not present

## 2014-12-14 DIAGNOSIS — R7303 Prediabetes: Secondary | ICD-10-CM

## 2014-12-14 MED ORDER — VITAMIN D (ERGOCALCIFEROL) 1.25 MG (50000 UNIT) PO CAPS: 50000.0000 [IU] | ORAL_CAPSULE | ORAL | Status: DC

## 2014-12-14 MED ORDER — AMITRIPTYLINE HCL 50 MG PO TABS: 50.0000 mg | ORAL_TABLET | Freq: Every day | ORAL | Status: DC

## 2014-12-14 MED ORDER — KETOCONAZOLE 2 % EX CREA: 1.0000 "application " | TOPICAL_CREAM | Freq: Every day | CUTANEOUS | Status: DC

## 2014-12-14 NOTE — Progress Notes (Signed)
Patient here for possible left/toe fungus She states it is very itching and painful She is asking for refills on her meclizine and her vitamin D

## 2014-12-14 NOTE — Assessment & Plan Note (Signed)
Bilateral flaky skin across heels, plantar surfaces of feet, c/w tinea pedis.  Ketoconazole 2% cream daily, recheck if not better.

## 2014-12-14 NOTE — Patient Instructions (Signed)
Fue un Research officer, trade union.    1. Para el hongo en los pies, KETOCONAZOLE crema, apliquesela a los pies una vez por dia. Tratar de usar zapatos abiertos/chancletas lo mas posible cuando esta' en casa.   2. Vitamina D, 50,000 unidades internacionales una capsula semanal, por 12 semanas.  Despues se tiene que volver a Passenger transport manager de vitamina D en la sangre aqui en la clinica.   3. Terese Door' a recetar la medicina amitriptyline  cada noche.  Debe volver en 3 meses, o antes si se le es necesario.    MEDICATION REFILLS FOR [facility].   FOLLOW UP IN 3 MONTHS FOR VISIT AND RECHECK VITAMIN D LEVEL, A1C.

## 2014-12-14 NOTE — Progress Notes (Signed)
   Subjective:    Patient ID: Stacie Mitchell, female    DOB: 1985-11-22, 29 y.o.   MRN: [id number]  HPI Visit in Spanish.  Patient reports concern for bilateral foot itch and scaling skin.  Worried because family members with DM who required amputation. She herself had elevated A1c in march 2016.   Tense with financial and home stressors, ran out of amitriptlyine which was helping her with sleep. Otherwise feels well.   Took 4 weeks of vitamin D 50K IU, did not take full 12 weeks. In march her Vit D was 10.    Review of Systems    no fevers or chills; some chest sharp discomfort when she is stressed, none now.  Objective:   Physical Exam Well appearing, no distress.  HEENT Acanthosis nigricans across neck FEET: Palpable dp pulses bilat. Scaling skin heels and plantar aspects both feet. Sensation grossly intact.      Assessment & Plan:

## 2015-01-01 ENCOUNTER — Ambulatory Visit: Payer: No Typology Code available for payment source | Attending: Family Medicine

## 2015-03-06 ENCOUNTER — Encounter: Payer: Self-pay | Admitting: Family Medicine

## 2015-03-06 ENCOUNTER — Ambulatory Visit: Payer: Self-pay | Attending: Family Medicine | Admitting: Family Medicine

## 2015-03-06 VITALS — BP 107/70 | HR 90 | Temp 98.7°F | Resp 16 | Ht 60.0 in | Wt 361.0 lb

## 2015-03-06 DIAGNOSIS — Z79899 Other long term (current) drug therapy: Secondary | ICD-10-CM | POA: Insufficient documentation

## 2015-03-06 DIAGNOSIS — Z0000 Encounter for general adult medical examination without abnormal findings: Secondary | ICD-10-CM | POA: Insufficient documentation

## 2015-03-06 NOTE — Patient Instructions (Addendum)
Julieanne MansonSarai was seen today for referral.  Diagnoses and all orders for this visit:  Healthcare maintenance -     Flu Vaccine QUAD 36+ mos IM -     Ambulatory referral to Dentistry   F/u in one year for wellness visit Keep routine f/u for depo   Dr. Armen PickupFunches

## 2015-03-06 NOTE — Progress Notes (Signed)
Patient ID: Stacie Mitchell, female   DOB: [birth year], 29 y.o.   MRN: [id number]   Subjective:  Patient ID: Stacie Mitchell, female    DOB: [birth year]  Age: 29 y.o. MRN: [id number] Spanish interpreter used  CC: Referral   HPI Stacie Mitchell presents for    1. Dental referral: no oral pain. No oral swelling. Currently does not have a dental home.  Social History  Substance Use Topics  . Smoking status: Never Smoker   . Smokeless tobacco: Never Used  . Alcohol Use: No    Outpatient Prescriptions Prior to Visit  Medication Sig Dispense Refill  . Acetaminophen (TYLENOL PO) Take 2 tablets by mouth every 6 (six) hours.    Marland Kitchen. ketoconazole (NIZORAL) 2 % cream Apply 1 application topically daily. 30 g 1  . meclizine (ANTIVERT) 25 MG tablet Take 1 tablet (25 mg total) by mouth 3 (three) times daily as needed for dizziness. 30 tablet 2  . amitriptyline (ELAVIL) 50 MG tablet Take 1 tablet (50 mg total) by mouth at bedtime. 30 tablet 3  . medroxyPROGESTERone (DEPO-PROVERA) 150 MG/ML injection Inject 150 mg into the muscle every 3 (three) months.    . Vitamin D, Ergocalciferol, (DRISDOL) 50000 UNITS CAPS capsule Take 1 capsule (50,000 Units total) by mouth every 7 (seven) days. (Patient not taking: Reported on 03/06/2015) 12 capsule 0   No facility-administered medications prior to visit.    ROS Review of Systems  Constitutional: Negative for fever and chills.  HENT: Positive for dental problem. Negative for facial swelling.   Eyes: Negative for visual disturbance.  Respiratory: Negative for shortness of breath.   Cardiovascular: Negative for chest pain.  Gastrointestinal: Negative for abdominal pain and blood in stool.  Musculoskeletal: Negative for back pain and arthralgias.  Skin: Negative for rash.  Allergic/Immunologic: Negative for immunocompromised state.  Hematological: Negative for adenopathy. Does not bruise/bleed easily.  Psychiatric/Behavioral: Negative  for suicidal ideas and dysphoric mood.    Objective:  BP 107/70 mmHg  Pulse 90  Temp(Src) 98.7 F (37.1 C) (Oral)  Resp 16  Ht 5' (1.524 m)  Wt 361 lb (163.749 kg)  BMI 70.50 kg/m2  SpO2 98%  BP/Weight 03/06/2015 12/14/2014 08/07/2014  Systolic BP 107 104 123  Diastolic BP 70 72 83  Wt. (Lbs) 361 362 361.6  BMI 70.5 70.7 64.07    Physical Exam  Constitutional: She is oriented to person, place, and time. She appears well-developed and well-nourished. No distress.  HENT:  Head: Normocephalic and atraumatic.  Mouth/Throat: Oropharynx is clear and moist. No oral lesions. Normal dentition. No dental abscesses or dental caries.  Pulmonary/Chest: Effort normal.  Musculoskeletal: She exhibits no edema.  Neurological: She is alert and oriented to person, place, and time.  Skin: Skin is warm and dry. No rash noted.  Psychiatric: She has a normal mood and affect.    Lab Results  Component Value Date   HGBA1C 5.8* 06/29/2014   Assessment & Plan:   Problem List Items Addressed This Visit    None    Visit Diagnoses    Healthcare maintenance    -  Primary    Relevant Orders    Flu Vaccine QUAD 36+ mos IM (Completed)    Ambulatory referral to Dentistry       No orders of the defined types were placed in this encounter.    Follow-up: No Follow-up on file.   Dessa PhiJosalyn Tucker Steedley MD

## 2015-03-06 NOTE — Progress Notes (Signed)
Requesting dental referral  No pain today  No tobacco user  No suicide thought in the past two weeks

## 2015-04-10 NOTE — Telephone Encounter (Signed)
noted 

## 2015-07-26 MED FILL — KETOCONAZOLE 2% CREAM: 2 | 15 days supply | Qty: 30 | Fill #1

## 2015-08-09 ENCOUNTER — Encounter: Payer: Self-pay | Admitting: Physician Assistant

## 2015-08-09 ENCOUNTER — Ambulatory Visit: Payer: Self-pay | Attending: Family Medicine | Admitting: Physician Assistant

## 2015-08-09 ENCOUNTER — Encounter: Payer: Self-pay | Admitting: Clinical

## 2015-08-09 VITALS — BP 151/88 | HR 80 | Temp 97.9°F | Wt 365.9 lb

## 2015-08-09 DIAGNOSIS — R7303 Prediabetes: Secondary | ICD-10-CM | POA: Insufficient documentation

## 2015-08-09 DIAGNOSIS — IMO0001 Reserved for inherently not codable concepts without codable children: Secondary | ICD-10-CM

## 2015-08-09 DIAGNOSIS — Z79899 Other long term (current) drug therapy: Secondary | ICD-10-CM | POA: Insufficient documentation

## 2015-08-09 DIAGNOSIS — B351 Tinea unguium: Secondary | ICD-10-CM | POA: Insufficient documentation

## 2015-08-09 DIAGNOSIS — R030 Elevated blood-pressure reading, without diagnosis of hypertension: Secondary | ICD-10-CM

## 2015-08-09 DIAGNOSIS — B353 Tinea pedis: Secondary | ICD-10-CM | POA: Insufficient documentation

## 2015-08-09 DIAGNOSIS — I10 Essential (primary) hypertension: Secondary | ICD-10-CM | POA: Insufficient documentation

## 2015-08-09 LAB — COMPREHENSIVE METABOLIC PANEL
ALT: 32 U/L — AB (ref 6–29)
AST: 23 U/L (ref 10–30)
Albumin: 3.8 g/dL (ref 3.6–5.1)
Alkaline Phosphatase: 64 U/L (ref 33–115)
BUN: 12 mg/dL (ref 7–25)
CALCIUM: 8.5 mg/dL — AB (ref 8.6–10.2)
CO2: 24 mmol/L (ref 20–31)
CREATININE: 0.58 mg/dL (ref 0.50–1.10)
Chloride: 105 mmol/L (ref 98–110)
GLUCOSE: 85 mg/dL (ref 65–99)
Potassium: 4.5 mmol/L (ref 3.5–5.3)
SODIUM: 138 mmol/L (ref 135–146)
Total Bilirubin: 0.3 mg/dL (ref 0.2–1.2)
Total Protein: 6.3 g/dL (ref 6.1–8.1)

## 2015-08-09 LAB — GLUCOSE, POCT (MANUAL RESULT ENTRY): POC GLUCOSE: 124 mg/dL — AB (ref 70–99)

## 2015-08-09 LAB — POCT GLYCOSYLATED HEMOGLOBIN (HGB A1C): HEMOGLOBIN A1C: 5.6

## 2015-08-09 MED ORDER — TERBINAFINE HCL 250 MG PO TABS: 250.0000 mg | ORAL_TABLET | Freq: Every day | ORAL | Status: DC

## 2015-08-09 MED ORDER — METFORMIN HCL 1000 MG PO TABS: 1000.0000 mg | ORAL_TABLET | Freq: Two times a day (BID) | ORAL | Status: DC

## 2015-08-09 MED ORDER — LISINOPRIL 10 MG PO TABS: 10.0000 mg | ORAL_TABLET | Freq: Every day | ORAL | Status: DC

## 2015-08-09 MED ORDER — KETOCONAZOLE 2 % EX CREA: 1.0000 "application " | TOPICAL_CREAM | Freq: Every day | CUTANEOUS | Status: DC

## 2015-08-09 MED FILL — KETOCONAZOLE 2% CREAM: 2 | 15 days supply | Qty: 30 | Fill #0

## 2015-08-09 MED FILL — TERBINAFINE HCL 250 MG TAB: 250 | 30 days supply | Qty: 30 | Fill #0

## 2015-08-09 MED FILL — ?METFORMIN HCL 1,000 MG TAB: 1000 | 30 days supply | Qty: 60 | Fill #0

## 2015-08-09 MED FILL — LISINOPRIL 10 MG TABLET: 10 | 30 days supply | Qty: 30 | Fill #0

## 2015-08-09 NOTE — Progress Notes (Signed)
[facility] 08/09/2015 03/06/2015 12/14/2014  Decreased Interest 1 0 0  Down, Depressed, Hopeless 2 0 0  PHQ - 2 Score 3 0 0  Altered sleeping 2 - -  Tired, decreased energy 2 - -  Change in appetite 3 - -  Feeling bad or failure about yourself  1 - -  Trouble concentrating 1 - -  Moving slowly or fidgety/restless 0 - -  Suicidal thoughts 0 - -  PHQ-9 Score 12 - -  Difficult doing work/chores Not difficult at all - -    GAD 7 : Generalized Anxiety Score 08/09/2015  Nervous, Anxious, on Edge 1  Control/stop worrying 1  Worry too much - different things 1  Trouble relaxing 1  Restless 1  Easily annoyed or irritable 1  Afraid - awful might happen 1  Total GAD 7 Score 7  Anxiety Difficulty Not difficult at all

## 2015-08-09 NOTE — Patient Instructions (Addendum)
On the metformin, take 1/2 tablet twice daily for the first week to minimize stomach upset, then increase to a whole tablet twice daily     Tia de las uas (Nail Ringworm) Usted presenta una infeccin por hongos en las uas de los pies. La parte visible de las uas est formada por clulas muertas que no tienen suministro sanguneo que intervenga en la prevencin de las infecciones. La infeccin se produce debido a que los hongos estn en todas partes. Aprovecharn cualquier oportunidad para crecer Presenter, broadcastingsobre material inerte. Esto incluye los tejidos de su cuerpo formados por General Electricclulas muertas.  Circuit CityComo las uas tienen un crecimiento muy lento, requieren Bushtonhasta 2 aos de tratamiento con medicamentos antimicticos. La infeccin involucra a toda la ua, hasta la base. Incluye aproximadamente 1/3 de la ua que no puede verse. Si el profesional le ha prescrito un medicamento por boca, United Autotmelo todos los das. No podr ver ningn progreso hasta que hayan transcurrido entre 6 y 9 meses. No debe preocuparse. La curacin es lenta. Se debe a que el medicamento llega hasta la infeccin de manera muy lenta. Los hongos pueden vivir sobre las clulas muertas con poca o casi ninguna exposicin al suministro de [place] Wal[place]angre. Esta tambin es la razn por la cual no se observa mejora en los primeros 6 meses. La ua comienza la curacin en la base, donde hay suministro de [place]re. La medicacin tpica, como las cremas y los ungentos generalmente no son eficaces. Channing MuttersUsted junto al profesional podrn elegir acelerar el proceso de curacin con la extraccin quirrgica de todas las uas. An as, Pharmacist, hospitalpuede necesitar entre 6 y 9 meses de medicamentos por va oral adicionales. Concurra a la Training and development officerconsulta con el profesional que lo asiste de acuerdo a lo que le haya indicado. Recuerde que no observar mejora durante al menos 6 meses. Consulte antes con el profesional si aparecen otros signos de infeccin (p. ej. enrojecimiento e hinchazn).   Esta  informacin no tiene Theme park managercomo fin reemplazar el consejo del mdico. Asegrese de hacerle al mdico cualquier pregunta que tenga.   Document Released: 01/08/2005 Document Revised: 08/15/2014 Elsevier Interactive Patient Education 2016 [employer]. Recuento bsico de carbohidratos para la diabetes mellitus (Basic Carbohydrate Counting for Diabetes Mellitus) El recuento de carbohidratos es un mtodo destinado a calcular la cantidad de carbohidratos en la dieta. El consumo de carbohidratos aumenta naturalmente el nivel de azcar (glucosa) en la sangre, por lo que es importante que sepa la cantidad que debe incluir en cada comida. El recuento de carbohidratos ayuda a Futures tradermantener el nivel de glucosa en la sangre dentro de los lmites normales. La cantidad permitida de carbohidratos es diferente para cada persona. Un nutricionista puede ayudarlo a calcular la cantidad adecuada para usted. Una vez que sepa la cantidad de carbohidratos que puede consumir, podr calcular los carbohidratos de los alimentos que desea comer. Los siguientes alimentos incluyen carbohidratos:  Granos, como panes y cereales.  Frijoles secos y productos con soja.  Vegetales almidonados, como papas, guisantes y maz.  Nils PyleFrutas y jugos de frutas.  Leche y Dentistyogur.  Dulces y bocadillos, como pastel, galletas, caramelos, papas fritas de bolsa, refrescos y bebidas frutales con azcar. RECUENTO DE CARBOHIDRATOS Toys ''R'' UsHay dos maneras de calcular los carbohidratos de los alimentos. Puede usar cualquiera de 1 Kamani Stlos dos mtodos o [place] combinacin de Poincianaambos. Leer la etiqueta de informacin nutricional de los alimentos envasados La informacin nutricional es una etiqueta incluida en casi todas las bebidas y los alimentos envasados de los [place]ados [place]. Indica el tamao de  la porcin de ese alimento o bebida e informacin sobre los nutrientes de cada porcin, incluso los gramos (g) de carbohidratos por porcin.  Decida la cantidad de porciones que comer o  tomar de este alimento o bebida. Multiplique la cantidad de porciones por el nmero de gramos de carbohidratos indicados en la etiqueta para esa porcin. El total ser la cantidad de carbohidratos que consumir al comer ese alimento o tomar esa bebida. Conocer las porciones estndar de los alimentos Cuando coma alimentos no envasados o que no incluyan la informacin nutricional en la etiqueta, deber medir las porciones para poder calcular la cantidad de carbohidratos. Una porcin de la mayora de los alimentos ricos en carbohidratos contiene alrededor de 15g de carbohidratos. La siguiente [employer] tamaos de porcin de los alimentos ricos en carbohidratos que contienen alrededor de 15g de carbohidratos por porcin:   1rebanada de pan (1oz) o 1tortilla de seis pulgadas.  panecillo de hamburguesa o bollito tipo ingls.  4a 6galletas.   de taza de cereal sin azcar y seco.   taza de cereal caliente.   de taza de arroz o pastas.  taza de pur de papas o de una papa grande al horno.  1taza de frutas frescas o una fruta pequea.  taza de frutas o jugo de frutas enlatados o congelados.  1 taza [employer].   de taza de yogur descremado sin ningn agregado o de yogur endulzado con edulcorante artificial.  taza de vegetales almidonados, como guisantes, maz o papas, o de frijoles secos cocidos. Decida la cantidad de porciones Advertising copywriter. Multiplique la cantidad de porciones por 15 (los gramos de carbohidratos en esa porcin). Por ejemplo, si come 2tazas de fresas, habr comido 2porciones y 30g de carbohidratos (2porciones x 15g = 30g). Para las comidas como sopas y guisos, en las que se mezcla ms de un alimento, deber [place] carbohidratos de cada alimento incluido. EJEMPLO DE RECUENTO DE CARBOHIDRATOS Ejemplo de cena  3 onzas de pechugas de pollo.   de taza de arroz integral.   taza de maz.  1 taza de [place].  1 taza de fresas con crema  batida sin azcar. Clculo de carbohidratos [place] 1: Identifique los alimentos que contienen carbohidratos:   Arroz.  Maz.  Leche.  Jinny Sanders. [place] 2: Calcule el nmero de porciones que consumir de cada uno:   2 porciones de Surveyor, minerals.  1 porcin de maz.  1 porcin de leche.  1 porcin de fresas. [place] 3: Multiplique cada una de esas porciones por 15g:   2 porciones de arroz x 15 g = 30 g.  1 porcin de maz x 15 g = 15 g.  1 porcin de leche x 15 g = 15 g.  1 porcin de fresas x 15 g = 15 g. [place] 4: Sume todas las cantidades para Artist total de gramos de carbohidratos consumidos: 30 g + 15 g + 15 g + 15 g = 75 g.   Esta informacin no tiene Theme park manager el consejo del mdico. Asegrese de hacerle al mdico cualquier pregunta que tenga.   Document Released: 06/23/2011 Document Revised: 04/21/2014 Elsevier Interactive Patient Education [employer]
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
## 2015-08-16 ENCOUNTER — Ambulatory Visit: Payer: Self-pay | Attending: Family Medicine

## 2015-09-06 ENCOUNTER — Ambulatory Visit: Payer: Self-pay | Attending: Family Medicine | Admitting: Family Medicine

## 2015-09-06 ENCOUNTER — Encounter: Payer: Self-pay | Admitting: Family Medicine

## 2015-09-06 VITALS — BP 117/75 | HR 70 | Temp 98.0°F | Resp 16 | Ht 60.0 in | Wt 365.0 lb

## 2015-09-06 DIAGNOSIS — Z6841 Body Mass Index (BMI) 40.0 and over, adult: Secondary | ICD-10-CM | POA: Insufficient documentation

## 2015-09-06 DIAGNOSIS — R7303 Prediabetes: Secondary | ICD-10-CM | POA: Insufficient documentation

## 2015-09-06 DIAGNOSIS — Z79899 Other long term (current) drug therapy: Secondary | ICD-10-CM | POA: Insufficient documentation

## 2015-09-06 DIAGNOSIS — Z7984 Long term (current) use of oral hypoglycemic drugs: Secondary | ICD-10-CM | POA: Insufficient documentation

## 2015-09-06 LAB — GLUCOSE, POCT (MANUAL RESULT ENTRY): POC Glucose: 118 mg/dl — AB (ref 70–99)

## 2015-09-06 NOTE — Progress Notes (Signed)
Subjective:  Patient ID: Stacie Mitchell, female    DOB: 01-Jan-1986  Age: 30 y.o. MRN: [id number]  [employer] Eulis Fosterlberto Spanish interpreter used [phone] CC: Diabetes   HPI Stacie Mitchell presents for    1. Prediabetes: she is taking and tolerating metformin. She is morbidly obese. She eats one meal a day. She walks for 1-2 hrs some days of the week.   2. Elevated BP: BP elevated at last OV due to stress at work. She also has HA, SOB and pressure in chest. She believes she was having an anxiety attack. She has a stressful relationship with one co-worker. She has talked to her manager and decided not to internalize the stress.   3. Heavy menses: for past year since removing nexplanon. She is not interested in birth control. She has tried depo, nexplanon and OCPs. In the past. She has two children she is not currently sexually active.   Social History  Substance Use Topics  . Smoking status: Never Smoker   . Smokeless tobacco: Never Used  . Alcohol Use: No    Outpatient Prescriptions Prior to Visit  Medication Sig Dispense Refill  . Acetaminophen (TYLENOL PO) Take 2 tablets by mouth every 6 (six) hours.    Marland Kitchen. ketoconazole (NIZORAL) 2 % cream Apply 1 application topically daily. 60 g 1  . lisinopril (PRINIVIL,ZESTRIL) 10 MG tablet Take 1 tablet (10 mg total) by mouth daily. 90 tablet 3  . meclizine (ANTIVERT) 25 MG tablet Take 1 tablet (25 mg total) by mouth 3 (three) times daily as needed for dizziness. 30 tablet 2  . medroxyPROGESTERone (DEPO-PROVERA) 150 MG/ML injection Inject 150 mg into the muscle every 3 (three) months. Reported on 08/09/2015    . metFORMIN (GLUCOPHAGE) 1000 MG tablet Take 1 tablet (1,000 mg total) by mouth 2 (two) times daily with a meal. 180 tablet 3  . terbinafine (LAMISIL) 250 MG tablet Take 1 tablet (250 mg total) by mouth daily. 90 tablet 0  . amitriptyline (ELAVIL) 50 MG tablet Take 1 tablet (50 mg total) by mouth at bedtime. 30 tablet 3   No  facility-administered medications prior to visit.    ROS Review of Systems  Constitutional: Negative for fever and chills.  Eyes: Negative for visual disturbance.  Respiratory: Negative for shortness of breath.   Cardiovascular: Negative for chest pain.  Gastrointestinal: Negative for abdominal pain and blood in stool.  Genitourinary: Positive for menstrual problem (heavy periods since being off birth control ).  Musculoskeletal: Negative for back pain and arthralgias.  Skin: Negative for rash.  Allergic/Immunologic: Negative for immunocompromised state.  Hematological: Negative for adenopathy. Does not bruise/bleed easily.  Psychiatric/Behavioral: Negative for suicidal ideas and dysphoric mood.    Objective:  BP 117/75 mmHg  Pulse 70  Temp(Src) 98 F (36.7 C) (Oral)  Resp 16  Ht 5' (1.524 m)  Wt 365 lb (165.563 kg)  BMI 71.28 kg/m2  SpO2 99%  LMP 09/05/2015  BP/Weight 09/06/2015 08/09/2015 03/06/2015  Systolic BP 117 151 107  Diastolic BP 75 88 70  Wt. (Lbs) 365 365.89 361  BMI 71.28 71.46 70.5    Physical Exam  Constitutional: She is oriented to person, place, and time. She appears well-developed and well-nourished. No distress.  Morbidly obese   HENT:  Head: Normocephalic and atraumatic.  Cardiovascular: Normal rate, regular rhythm, normal heart sounds and intact distal pulses.   Pulmonary/Chest: Effort normal and breath sounds normal.  Musculoskeletal: She exhibits no edema.  Neurological: She is alert and oriented to person, place, and time.  Skin: Skin is warm and dry. No rash noted.  Psychiatric: She has a normal mood and affect.     Assessment & Plan:   Charrisse was seen today for diabetes.  Diagnoses and all orders for this visit:  Prediabetes -     POCT glucose (manual entry)  Morbid obesity with BMI of 70 and over, adult (HCC)    No orders of the defined types were placed in this encounter.    Follow-up: No Follow-up on file.   Dessa Phi MD

## 2015-09-06 NOTE — Patient Instructions (Signed)
Stacie Mitchell was seen today for diabetes.  Diagnoses and all orders for this visit:  Prediabetes -     POCT glucose (manual entry)  Morbid obesity with BMI of 70 and over, adult [facility](HCC)   Your blood pressure and blood sugar are normal today  F/u in 4-6 weeks for pap smear   Dr. Armen PickupFunches

## 2015-09-06 NOTE — Assessment & Plan Note (Signed)
Patient advised to eat at least 2-3 times a day. Increase lean protein and vegetables in her diet Regular exercise with goal of 150 min/week to start

## 2015-09-06 NOTE — Assessment & Plan Note (Signed)
A1c down from 5.8 to 5.6 last check Continuing metformin Recommending exercise and weight loss

## 2015-09-06 NOTE — Progress Notes (Signed)
F/U prediabetic  Pain scale #10 due to menses  No suicidal thoughts in the past two weeks

## 2015-09-24 MED FILL — KETOCONAZOLE 2% CREAM: 2 | 15 days supply | Qty: 30 | Fill #1

## 2015-09-24 MED FILL — ?LISINOPRIL 10 MG TABLET: 10 | 30 days supply | Qty: 30 | Fill #1

## 2015-09-24 MED FILL — ?METFORMIN HCL 1,000 MG TAB: 1000 | 30 days supply | Qty: 60 | Fill #1

## 2015-09-24 MED FILL — TERBINAFINE HCL 250 MG TAB: 250 | 30 days supply | Qty: 30 | Fill #1

## 2015-10-18 ENCOUNTER — Ambulatory Visit (HOSPITAL_COMMUNITY): Admission: EM | Admit: 2015-10-18 | Discharge: 2015-10-18 | Discharge status: Absent without leave | Payer: Self-pay

## 2015-10-24 ENCOUNTER — Encounter: Payer: Self-pay | Admitting: Internal Medicine

## 2015-10-24 ENCOUNTER — Ambulatory Visit: Payer: Self-pay | Attending: Internal Medicine | Admitting: Internal Medicine

## 2015-10-24 VITALS — BP 108/72 | HR 110 | Temp 99.3°F | Resp 18 | Ht 62.0 in | Wt 360.0 lb

## 2015-10-24 DIAGNOSIS — B353 Tinea pedis: Secondary | ICD-10-CM | POA: Insufficient documentation

## 2015-10-24 DIAGNOSIS — R7303 Prediabetes: Secondary | ICD-10-CM | POA: Insufficient documentation

## 2015-10-24 DIAGNOSIS — Z6841 Body Mass Index (BMI) 40.0 and over, adult: Secondary | ICD-10-CM | POA: Insufficient documentation

## 2015-10-24 DIAGNOSIS — E669 Obesity, unspecified: Secondary | ICD-10-CM | POA: Insufficient documentation

## 2015-10-24 DIAGNOSIS — L600 Ingrowing nail: Secondary | ICD-10-CM

## 2015-10-24 MED ORDER — BACITRACIN-NEOMYCIN-POLYMYXIN 400-5-5000 EX OINT: 1.0000 "application " | TOPICAL_OINTMENT | Freq: Two times a day (BID) | CUTANEOUS | Status: DC

## 2015-10-24 MED ORDER — CLINDAMYCIN HCL 300 MG PO CAPS: 300.0000 mg | ORAL_CAPSULE | Freq: Three times a day (TID) | ORAL | Status: DC

## 2015-10-24 MED FILL — CLINDAMYCIN HCL 300 MG CAP: 300 | 7 days supply | Qty: 21 | Fill #0

## 2015-10-24 NOTE — Progress Notes (Signed)
Patient is here for FU Leftt Toe Pain  Patient complains of left big toe pain being present for the past 3 weeks. Patient describes pain as throbbing.  Patient has taken metformin and lisinopril today. Patient has also eaten.

## 2015-10-24 NOTE — Assessment & Plan Note (Signed)
Pt education on condition provided Po abx rx'd, instructed on need for soaks and refer to podiatry for ongoing care given preDM
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
## 2015-10-24 NOTE — Assessment & Plan Note (Signed)
Stop antifungal cream

## 2015-10-24 NOTE — Patient Instructions (Signed)
Uña del pie encarnada  (Ingrown Toenail)  La uña del pie encarnada se produce cuando las esquinas o los costados de la uña crecen hacia la piel circundante. Es más frecuente en el dedo gordo, pero puede ocurrir en cualquier dedo del pie. Si la uña del pie encarnada no se trata, puede correr riesgo de infectarse.  CAUSAS  Este trastorno puede ser causado por:  · Uso de calzado muy pequeño o apretado.  · Lesión o traumatismo, por ejemplo, al golpearse el dedo contra algo o si alguien se lo pisa.  · Cuidado inadecuado de las uñas del pie o uñas mal cortadas.  · Anomalías presentes desde el nacimiento en las uñas o los pies (congénitas), por ejemplo, una uña muy grande para el dedo.  FACTORES DE RIESGO  Entre los factores de riesgo de tener una uña del pie encarnada, se incluyen los siguientes:  · La edad. Las uñas tienden a engrosarse con el paso del tiempo, por lo que las uñas encarnadas son más frecuentes en las personas de edad avanzada.  · Diabetes.  · Uñas del pie mal cortadas.  · Problemas en la circulación sanguínea.  SÍNTOMAS  Entre los síntomas se pueden incluir los siguientes:  · Inflamación o dolor y sensibilidad con la palpación.  · Enrojecimiento.  · Hinchazón.  · Endurecimiento de la piel alrededor del dedo.  Si nota líquido, pus o supuración, la uña del pie encarnada puede estar infectada.  DIAGNÓSTICO   La uña del pie encarnada se puede diagnosticar mediante la historia clínica y un examen físico. Si la uña está infectada, el médico puede analizar una muestra del líquido que supura.  TRATAMIENTO  El tratamiento depende de la gravedad de la uña del pie encarnada. Algunos casos pueden tratarse en casa; otros casos más graves o en los que la uña se infecta requieren cirugía para extirpar la uña total o parcialmente. Las uñas del pie encarnadas e infectadas también pueden tratarse con antibióticos.  INSTRUCCIONES PARA EL CUIDADO EN EL HOGAR  · Si le recetaron antibióticos, asegúrese de terminarlos, incluso  si comienza a sentirse mejor.  · Remoje el pie en agua tibia jabonosa durante 20 minutos, 3 veces al día, o como se lo haya indicado el médico.  · Separe con cuidado el borde de la uña de la piel dolorida e introduzca un pequeño trozo de algodón debajo de la esquina de la uña. Esto disminuirá el dolor.  Tenga cuidado de no lesionar más el área.  · Use zapatos que calcen bien. En caso de que la uña del pie encarnada le cause dolor, intente usar sandalias, si es posible.  · Córtese las uñas de los pies con cuidado y de forma regular. No las corte de forma curva. Córtese las uñas de los pies en línea recta, para evitar lesiones en la piel en las esquinas de las uñas.  · Mantenga los pies limpios y secos.  · Si tiene problemas para caminar y el médico le da muletas, úselas según las indicaciones.  · No se toque la uña del pie ni trate de quitarla por su cuenta.  · Tome los medicamentos solamente como se lo haya indicado el médico.  · Concurra a todas las visitas de control como se lo haya indicado el médico. Esto es importante.  SOLICITE ATENCIÓN MÉDICA SI:  · Los síntomas no mejoran con el tratamiento.  SOLICITE ATENCIÓN MÉDICA DE INMEDIATO SI:  · Tiene líneas rojas que comienzan en el pie y continúan en la pierna.  ·   Tiene fiebre.  · El enrojecimiento, la hinchazón o el dolor aumentan.  · Observa líquido, sangre o pus que sale de la uña del pie.     Esta información no tiene como fin reemplazar el consejo del médico. Asegúrese de hacerle al médico cualquier pregunta que tenga.     Document Released: 03/31/2005 Document Revised: 08/15/2014  Elsevier Interactive Patient Education ©2016 [employer] Inc.   

## 2015-10-24 NOTE — Assessment & Plan Note (Signed)
Continue metformin Lab Results  Component Value Date   HGBA1C 5.6 08/09/2015

## 2015-10-30 ENCOUNTER — Telehealth: Payer: Self-pay | Admitting: Family Medicine

## 2015-10-30 NOTE — Telephone Encounter (Signed)
Pt. Called stating that she received a letter stating to call the office and schedule a Financial appointment.  Pt. Already saw the financial counselor in May and applied for the OC and cone discount. Please f/u with pt.

## 2015-11-01 NOTE — Telephone Encounter (Signed)
I called patient yesterday and I lvm to call me back . I sent the referral to podiatry [facility] in the [place] . They will contact patient to schedule an appointment .

## 2015-11-05 ENCOUNTER — Encounter (HOSPITAL_COMMUNITY): Payer: Self-pay

## 2015-11-05 ENCOUNTER — Emergency Department (HOSPITAL_COMMUNITY)
Admission: EM | Admit: 2015-11-05 | Discharge: 2015-11-05 | Disposition: A | Discharge status: Home / Self Care | Payer: Self-pay | Attending: Emergency Medicine | Admitting: Emergency Medicine

## 2015-11-05 ENCOUNTER — Emergency Department (HOSPITAL_COMMUNITY): Payer: Self-pay

## 2015-11-05 DIAGNOSIS — Y999 Unspecified external cause status: Secondary | ICD-10-CM | POA: Insufficient documentation

## 2015-11-05 DIAGNOSIS — Z79899 Other long term (current) drug therapy: Secondary | ICD-10-CM | POA: Insufficient documentation

## 2015-11-05 DIAGNOSIS — Z792 Long term (current) use of antibiotics: Secondary | ICD-10-CM | POA: Insufficient documentation

## 2015-11-05 DIAGNOSIS — S61211A Laceration without foreign body of left index finger without damage to nail, initial encounter: Secondary | ICD-10-CM | POA: Insufficient documentation

## 2015-11-05 DIAGNOSIS — Y9389 Activity, other specified: Secondary | ICD-10-CM | POA: Insufficient documentation

## 2015-11-05 DIAGNOSIS — Z7984 Long term (current) use of oral hypoglycemic drugs: Secondary | ICD-10-CM | POA: Insufficient documentation

## 2015-11-05 DIAGNOSIS — W260XXA Contact with knife, initial encounter: Secondary | ICD-10-CM | POA: Insufficient documentation

## 2015-11-05 DIAGNOSIS — S61219A Laceration without foreign body of unspecified finger without damage to nail, initial encounter: Secondary | ICD-10-CM

## 2015-11-05 DIAGNOSIS — Y929 Unspecified place or not applicable: Secondary | ICD-10-CM | POA: Insufficient documentation

## 2015-11-05 DIAGNOSIS — Z23 Encounter for immunization: Secondary | ICD-10-CM | POA: Insufficient documentation

## 2015-11-05 MED ORDER — TETANUS-DIPHTH-ACELL PERTUSSIS 5-2.5-18.5 LF-MCG/0.5 IM SUSP
0.5000 mL | Freq: Once | INTRAMUSCULAR | Status: AC
  Administered 2015-11-05: 0.5 mL via INTRAMUSCULAR
  Filled 2015-11-05: qty 0.5

## 2015-11-05 NOTE — Discharge Instructions (Signed)
Please read and follow all provided instructions.  Your diagnoses today include:  1. Finger laceration, initial encounter   2. Laceration of finger, initial encounter     Tests performed today include: X-ray of the affected area that did not show any foreign bodies or broken bones Vital signs. See below for your results today.   Medications prescribed:   Take any prescribed medications only as directed.   Home care instructions:  Follow any educational materials and wound care instructions contained in this packet.   Return instructions:  Return to the Emergency Department if you have: Fever Worsening pain Worsening swelling of the wound Pus draining from the wound Redness of the skin that moves away from the wound, especially if it streaks away from the affected area  Any other emergent concerns  Your vital signs today were: BP 162/86 (BP Location: Left Arm)    Pulse 94    Temp 98.4 F (36.9 C) (Oral)    Resp 18    Ht 5\' 2"  (1.575 m)    Wt (!) 163.3 kg    LMP 10/13/2015    SpO2 96%    BMI 65.84 kg/m  If your blood pressure (BP) was elevated above 135/85 this visit, please have this repeated by your doctor within one month. --------------

## 2015-11-05 NOTE — ED Triage Notes (Signed)
Pt cut left index finger today with knife.  Bleeding controlled

## 2015-11-05 NOTE — ED Notes (Signed)
Called twice, no answer
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
## 2015-11-19 ENCOUNTER — Encounter: Payer: Self-pay | Admitting: Sports Medicine

## 2015-11-19 ENCOUNTER — Ambulatory Visit (INDEPENDENT_AMBULATORY_CARE_PROVIDER_SITE_OTHER): Payer: No Typology Code available for payment source | Admitting: Sports Medicine

## 2015-11-19 DIAGNOSIS — M79675 Pain in left toe(s): Secondary | ICD-10-CM

## 2015-11-19 DIAGNOSIS — B359 Dermatophytosis, unspecified: Secondary | ICD-10-CM

## 2015-11-19 DIAGNOSIS — L600 Ingrowing nail: Secondary | ICD-10-CM

## 2015-11-19 NOTE — Progress Notes (Signed)
Subjective: Chevi Lim is a 30 y.o. female patient presents to office today complaining of a painful incurvated, red, hot, swollen lateral nail border of the 1st toe on the left foot. This has been present for 3 months. Patient has treated this by soaking and trimming. Patient denies fever/chills/nausea/vomitting/any other related constitutional symptoms at this time.  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 Has not tried any treatment. No longer works.  Patient Active Problem List   Diagnosis Date Noted  . Ingrown right greater toenail 10/24/2015  . Morbid obesity with BMI of 70 and over, adult (HCC) 09/06/2015  . Tinea pedis of both feet 12/14/2014  . Prediabetes 12/14/2014  . Headache 06/19/2014  . Dizziness and giddiness 06/19/2014    Current Outpatient Prescriptions on File Prior to Visit  Medication Sig Dispense Refill  . Acetaminophen (TYLENOL PO) Take 2 tablets by mouth every 6 (six) hours.    Marland Kitchen amitriptyline (ELAVIL) 50 MG tablet Take 1 tablet (50 mg total) by mouth at bedtime. 30 tablet 3  . clindamycin (CLEOCIN) 300 MG capsule Take 1 capsule (300 mg total) by mouth 3 (three) times daily. 21 capsule 0  . lisinopril (PRINIVIL,ZESTRIL) 10 MG tablet Take 1 tablet (10 mg total) by mouth daily. 90 tablet 3  . metFORMIN (GLUCOPHAGE) 1000 MG tablet Take 1 tablet (1,000 mg total) by mouth 2 (two) times daily with a meal. 180 tablet 3  . neomycin-bacitracin-polymyxin (NEOSPORIN) ointment Apply 1 application topically every 12 (twelve) hours. apply to eye 15 g 0   No current facility-administered medications on file prior to visit.     No Known Allergies  Objective:  There were no vitals filed for this visit.  General: Well developed, nourished, in no acute distress, alert and oriented x3   Dermatology: Skin is warm, dry and supple bilateral. Left hallux nail appears to be  severely  incurvated with hyperkeratosis formation at the distal aspects of  the lateral nail border. (-) Erythema. (+) Edema. (-) serosanguous  drainage present. The remaining nails appear unremarkable at this time. There are no open sores, lesions, mild scaly skin plantar L>R foot but no other signs of infection present.  Vascular: Dorsalis Pedis artery and Posterior Tibial artery pedal pulses are 2/4 bilateral with immedate capillary fill time. Pedal hair growth present. No lower extremity edema.   Neruologic: Grossly intact via light touch bilateral.  Musculoskeletal: Tenderness to palpation of the Left hallux lateral nail fold. Muscular strength within normal limits in all groups bilateral.   Assesement and Plan: Problem List Items Addressed This Visit    None    Visit Diagnoses    Ingrown nail    -  Primary   Left hallux lateral margin   Toe pain, left       Tinea         -Discussed treatment alternatives and plan of care; Explained permanent/temporary nail avulsion and post procedure course to patient. Patient opt for PNA left lateral hallux nail margin - After a verbal consent, injected 3 ml of a 50:50 mixture of 2% plain  lidocaine and 0.5% plain marcaine in a normal hallux block fashion. Next, a  betadine prep was performed. Anesthesia was tested and found to be appropriate.  The offending left hallux lateral nail border was then incised from the hyponychium to the epinychium. The offending nail border was removed and cleared from the field. The area  was curretted for any remaining nail or spicules. Phenol application performed and the area was then flushed with alcohol and dressed with antibiotic cream and a dry sterile dressing. -Patient was instructed to leave the dressing intact for today and begin soaking  in a weak solution of betadine or Epsom salt and water tomorrow. Patient was instructed to soak for 15 minutes each day and apply neosporin and a gauze or bandaid dressing each day  until scab forms. -Patient was instructed to monitor the toe for signs of infection and return to office if toe becomes red, hot or swollen. -Advised ice, elevation, and tylenol or motrin if needed for pain.  -Advised medication for tinea; patient reports she can not afford medication thus recommend vinegar or black tea soaks and when she can afford it to try OTC anti-fungal creams/sprays -Recommend good hygiene habits -Patient is to return as needed for follow up care or sooner if problems arise.  Asencion Islamitorya Myosha Cuadras, DPM

## 2015-11-19 NOTE — Patient Instructions (Signed)

## 2015-11-29 ENCOUNTER — Ambulatory Visit: Payer: Self-pay | Attending: Internal Medicine

## 2015-12-11 MED FILL — ?LISINOPRIL 10 MG TABLET: 10 | 30 days supply | Qty: 30 | Fill #2

## 2015-12-11 MED FILL — ?METFORMIN HCL 1,000 MG TAB: 1000 | 30 days supply | Qty: 60 | Fill #2

## 2016-02-11 MED FILL — KETOCONAZOLE 2% CREAM: 2 | 15 days supply | Qty: 30 | Fill #2

## 2016-02-11 MED FILL — TERBINAFINE HCL 250 MG TAB: 250 | 30 days supply | Qty: 30 | Fill #2

## 2016-02-11 MED FILL — ?LISINOPRIL 10 MG TABLET: 10 | 30 days supply | Qty: 30 | Fill #3

## 2016-02-22 MED FILL — KETOCONAZOLE 2% CREAM: 2 | 15 days supply | Qty: 30 | Fill #3

## 2016-02-26 ENCOUNTER — Other Ambulatory Visit: Payer: Self-pay | Admitting: Physician Assistant

## 2016-02-26 DIAGNOSIS — B351 Tinea unguium: Secondary | ICD-10-CM

## 2016-02-26 DIAGNOSIS — B353 Tinea pedis: Secondary | ICD-10-CM

## 2016-04-02 ENCOUNTER — Ambulatory Visit: Payer: Self-pay | Attending: Internal Medicine | Admitting: Licensed Clinical Social Worker

## 2016-04-02 DIAGNOSIS — G44229 Chronic tension-type headache, not intractable: Secondary | ICD-10-CM | POA: Insufficient documentation

## 2016-04-02 MED FILL — KETOCONAZOLE 2% CREAM: 2 | 30 days supply | Qty: 60 | Fill #0

## 2016-04-03 NOTE — Progress Notes (Signed)
Pt was scheduled for behavioral health counseling, instead, of financial counseling. Pt intended to renew [employer]. No behavioral health needs noted.

## 2016-04-04 ENCOUNTER — Ambulatory Visit: Payer: Self-pay

## 2016-04-04 ENCOUNTER — Encounter: Payer: Self-pay | Admitting: Physician Assistant

## 2016-04-04 ENCOUNTER — Ambulatory Visit: Payer: Self-pay | Attending: Family Medicine | Admitting: Physician Assistant

## 2016-04-04 ENCOUNTER — Other Ambulatory Visit: Payer: Self-pay | Admitting: Physician Assistant

## 2016-04-04 VITALS — BP 149/85 | HR 76 | Temp 98.9°F | Resp 16 | Wt 349.4 lb

## 2016-04-04 DIAGNOSIS — E669 Obesity, unspecified: Secondary | ICD-10-CM | POA: Insufficient documentation

## 2016-04-04 DIAGNOSIS — Z1159 Encounter for screening for other viral diseases: Secondary | ICD-10-CM

## 2016-04-04 DIAGNOSIS — Z7984 Long term (current) use of oral hypoglycemic drugs: Secondary | ICD-10-CM | POA: Insufficient documentation

## 2016-04-04 DIAGNOSIS — R52 Pain, unspecified: Secondary | ICD-10-CM | POA: Insufficient documentation

## 2016-04-04 DIAGNOSIS — E1165 Type 2 diabetes mellitus with hyperglycemia: Secondary | ICD-10-CM | POA: Insufficient documentation

## 2016-04-04 DIAGNOSIS — R300 Dysuria: Secondary | ICD-10-CM | POA: Insufficient documentation

## 2016-04-04 DIAGNOSIS — Z23 Encounter for immunization: Secondary | ICD-10-CM | POA: Insufficient documentation

## 2016-04-04 DIAGNOSIS — F329 Major depressive disorder, single episode, unspecified: Secondary | ICD-10-CM | POA: Insufficient documentation

## 2016-04-04 DIAGNOSIS — R7303 Prediabetes: Secondary | ICD-10-CM

## 2016-04-04 LAB — POCT URINALYSIS DIPSTICK
Bilirubin, UA: NEGATIVE
Glucose, UA: NEGATIVE
KETONES UA: NEGATIVE
Leukocytes, UA: NEGATIVE
Nitrite, UA: NEGATIVE
PH UA: 5.5
PROTEIN UA: NEGATIVE
Spec Grav, UA: 1.025
Urobilinogen, UA: 0.2

## 2016-04-04 LAB — POCT GLYCOSYLATED HEMOGLOBIN (HGB A1C): HEMOGLOBIN A1C: 5.5

## 2016-04-04 LAB — GLUCOSE, POCT (MANUAL RESULT ENTRY): POC Glucose: 132 mg/dl — AB (ref 70–99)

## 2016-04-04 LAB — HEPATITIS C ANTIBODY: HCV Ab: NEGATIVE
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
## 2016-04-04 NOTE — Patient Instructions (Signed)
Make a nurse visit appointment in 1 month(~05/05/2016) for your 2nd Hep B vaccine and 226months(~June 2018) for your 3rd 3rd Hepatitis B vaccine  Make a nurse visit appointment March 2018 for your 2nd Hepatitis A vaccine

## 2016-04-06 LAB — URINE CULTURE

## 2016-04-21 MED FILL — TERBINAFINE HCL 250 MG TAB: 250 | 30 days supply | Qty: 30 | Fill #0

## 2016-04-22 ENCOUNTER — Telehealth: Payer: Self-pay

## 2016-04-22 NOTE — Telephone Encounter (Signed)
Patient Verify DOB  Patient was aware that the urine culture did not grew a specific bacteria & just recommend her to drink a lot of water. Also that her hepatitis C was negative  Patient also wanted to get screen for hepatis A & B, CMA transfer to scheduling

## 2016-04-22 NOTE — Telephone Encounter (Signed)
-----   Message from Anders SimmondsAngela M McClung, [place]-C sent at 04/09/2016  2:21 PM EST ----- Please call patient.  Her urine culture did not grow out a specific bacteria.  Tell her to drink a lot of water and lets recheck her urine if her symptoms worsen/dont improve. Hepatitis C testing was negative.  Thanks, Georgian CoAngela McClung, PA-C

## 2016-04-23 MED FILL — KETOCONAZOLE 2% CREAM: 2 | 30 days supply | Qty: 60 | Fill #1

## 2016-05-15 ENCOUNTER — Telehealth: Payer: Self-pay | Admitting: Family Medicine

## 2016-05-15 ENCOUNTER — Encounter: Payer: Self-pay | Admitting: Physician Assistant

## 2016-05-15 ENCOUNTER — Ambulatory Visit: Payer: Self-pay | Attending: Physician Assistant | Admitting: Physician Assistant

## 2016-05-15 ENCOUNTER — Ambulatory Visit (HOSPITAL_BASED_OUTPATIENT_CLINIC_OR_DEPARTMENT_OTHER): Payer: Self-pay | Admitting: *Deleted

## 2016-05-15 VITALS — BP 143/86 | HR 96 | Temp 98.2°F | Resp 20 | Wt 355.0 lb

## 2016-05-15 DIAGNOSIS — L989 Disorder of the skin and subcutaneous tissue, unspecified: Secondary | ICD-10-CM | POA: Insufficient documentation

## 2016-05-15 DIAGNOSIS — E669 Obesity, unspecified: Secondary | ICD-10-CM | POA: Insufficient documentation

## 2016-05-15 DIAGNOSIS — L309 Dermatitis, unspecified: Secondary | ICD-10-CM | POA: Insufficient documentation

## 2016-05-15 DIAGNOSIS — Z7984 Long term (current) use of oral hypoglycemic drugs: Secondary | ICD-10-CM | POA: Insufficient documentation

## 2016-05-15 DIAGNOSIS — R7303 Prediabetes: Secondary | ICD-10-CM | POA: Insufficient documentation

## 2016-05-15 DIAGNOSIS — Z1159 Encounter for screening for other viral diseases: Secondary | ICD-10-CM

## 2016-05-15 DIAGNOSIS — Z23 Encounter for immunization: Secondary | ICD-10-CM

## 2016-05-15 DIAGNOSIS — F329 Major depressive disorder, single episode, unspecified: Secondary | ICD-10-CM | POA: Insufficient documentation

## 2016-05-15 LAB — GLUCOSE, POCT (MANUAL RESULT ENTRY): POC Glucose: 129 mg/dl — AB (ref 70–99)

## 2016-05-15 MED ORDER — TRIAMCINOLONE ACETONIDE 0.5 % EX OINT: 1.0000 "application " | TOPICAL_OINTMENT | Freq: Two times a day (BID) | CUTANEOUS | 0 refills | Status: AC

## 2016-05-15 MED ORDER — CLINDAMYCIN PHOSPHATE 1 % EX GEL: Freq: Two times a day (BID) | CUTANEOUS | 0 refills | Status: AC

## 2016-05-15 MED FILL — CLINDAMYCIN PH 1% GEL: 1 | 20 days supply | Qty: 30 | Fill #0

## 2016-05-15 MED FILL — TRIAMCINOLONE 0.5% OINTMENT: 0.5 | 20 days supply | Qty: 15 | Fill #0

## 2016-05-15 NOTE — Telephone Encounter (Signed)
Will route to PCP 

## 2016-05-15 NOTE — Telephone Encounter (Signed)
Left message for patient to call back. Will attempt to call patient to inform she may come in today to see PA, Sindy Messingoger Gomez.

## 2016-05-15 NOTE — Telephone Encounter (Signed)
Patient called to schedule an appt. With pcp regarding bumps all over body she has had over two weeks, she states she believed they would go away but have not yet...she would like to speak to pcp.  The first available with PCP was offered for 2/13 she declined.Stacie Mitchell.Stacie Mitchell..Stacie Mitchell

## 2016-05-15 NOTE — Patient Instructions (Signed)
Eczema (Eczema) El eczema, tambin llamada dermatitis atpica, es una afeccin de la piel que causa inflamacin de la misma. Este trastorno produce una erupcin roja y sequedad y escamas en la piel. Hay gran picazn. El eczema generalmente empeora durante los meses fros del invierno y generalmente desaparece o mejora con el tiempo clido del verano. El eczema generalmente comienza a manifestarse en la infancia. Algunos nios desarrollan este trastorno y ste puede prolongarse en la adultez. CAUSAS La causa exacta no se conoce pero parece ser una afeccin hereditaria. Generalmente las personas que sufren eczema tienen una historia familiar de eczema, alergias, asma o fiebre de heno. Esta enfermedad no es contagiosa. Algunas causas de los brotes pueden ser:  Contacto con alguna cosa a la que es sensible o alrgico.  Estrs. SIGNOS Y SNTOMAS  Piel seca y escamosa.  Erupcin roja y que pica.  Picazn. Esta puede ocurrir antes de que aparezca la erupcin y puede ser muy intensa. DIAGNSTICO El diagnstico de eczema se realiza basndose en los sntomas y en la historia clnica. TRATAMIENTO El eczema no puede curarse, pero los sntomas generalmente pueden controlarse con tratamiento y otras estrategias. Un plan de tratamiento puede incluir:  Control de la picazn y el rascado.  Utilice antihistamnicos de venta libre segn las indicaciones, para aliviar la picazn. Es especialmente til por las noches cuando la picazn tiende a empeorar.  Utilice medicamentos de venta libre para la picazn, segn las indicaciones del mdico.  Evite rascarse. El rascado hace que la picazn empeore. Tambin puede producir una infeccin en la piel (imptigo) debido a las lesiones en la piel causadas por el rascado.  Mantenga la piel bien humectada con cremas, todos los das. La piel quedar hmeda y ayudar a prevenir la sequedad. Las lociones que contengan alcohol y agua deben evitarse debido a que pueden  secar la piel.  Limite la exposicin a las cosas a las que es sensible o alrgico (alrgenos).  Reconozca las situaciones que puedan causar estrs.  Desarrolle un plan para controlar el estrs. INSTRUCCIONES PARA EL CUIDADO EN EL HOGAR  Tome slo medicamentos de venta libre o recetados, segn las indicaciones del mdico.  No aplique nada sobre la piel sin consultar a su mdico.  Deber tomar baos o duchas de corta duracin (5 minutos) en agua tibia (no caliente). Use jabones suaves para el bao. No deben tener perfume. Puede agregar aceite de bao no perfumado al agua del bao. Es mejor evitar el jabn y el bao de espuma.  Inmediatamente despus del bao o de la ducha, cuando la piel aun est hmeda, aplique una crema humectante en todo el cuerpo. Este ungento debe ser en base a vaselina. La piel quedar hmeda y ayudar a prevenir la sequedad. Cuanto ms espeso sea el ungento, mejor. No deben tener perfume.  Mantenga las uas cortas. Es posible que los nios con eczema necesiten usar guantes o mitones por la noche, despus de aplicarse el ungento.  Vista al nio con ropa de algodn o mezcla de algodn. Vstalo con ropas ligeras ya que el calor aumenta la picazn.  Un nio con eczema debe permanecer alejado de personas que tengan ampollas febriles o llagas del resfro. El virus que causa las ampollas febriles (herpes simple) puede ocasionar una infeccin grave en la piel de los nios que padecen eczema. SOLICITE ATENCIN MDICA SI:  La picazn le impide dormir.  La erupcin empeora o no mejora dentro de la semana en la que se inicia el tratamiento.    Observa pus o costras amarillas en la zona de la erupcin.  Tiene fiebre.  Aparece un brote despus de haber estado en contacto con alguna persona que tiene ampollas febriles. Esta informacin no tiene Theme park managercomo fin reemplazar el consejo del mdico. Asegrese de hacerle al mdico cualquier pregunta que tenga. Document Released:  03/31/2005 Document Revised: 01/19/2013 Document Reviewed: 11/01/2012 Elsevier Interactive Patient Education  2017 [employer]

## 2016-05-15 NOTE — Telephone Encounter (Signed)
Called pt. And scheduled her an appointment today at 2:30.

## 2016-05-15 NOTE — Telephone Encounter (Signed)
Patient to be seen on walk in schedule
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
## 2016-05-15 NOTE — Progress Notes (Signed)
Concern with vesicle on right side of back she states is painful and spot on left side.  Concern with veins on Left  Inner thigh

## 2016-05-15 NOTE — Progress Notes (Signed)
Pt here for 2nd Hep B vaccination

## 2016-05-15 NOTE — Patient Instructions (Addendum)
Make a nurse visit appointment in 1 month(~05/05/2016) for your 2nd Hep B vaccine and 316months(~June 2018) for your 3rd 3rd Hepatitis B vaccine   Return in about 1 month (around 05/05/2016) for f/up Dr Armen PickupFunches; for hyperglycemia.   Make a nurse visit appointment March 2018 for your 2nd Hepatitis A vaccine

## 2016-06-12 ENCOUNTER — Ambulatory Visit: Payer: Self-pay

## 2016-06-18 ENCOUNTER — Ambulatory Visit: Payer: Self-pay | Attending: Family Medicine | Admitting: *Deleted

## 2016-06-18 DIAGNOSIS — Z0000 Encounter for general adult medical examination without abnormal findings: Secondary | ICD-10-CM

## 2016-06-18 DIAGNOSIS — Z23 Encounter for immunization: Secondary | ICD-10-CM

## 2016-08-27 ENCOUNTER — Encounter: Payer: Self-pay | Admitting: Family Medicine

## 2016-09-18 ENCOUNTER — Ambulatory Visit: Payer: Self-pay | Attending: Family Medicine | Admitting: *Deleted

## 2016-09-18 ENCOUNTER — Ambulatory Visit: Payer: Self-pay | Admitting: Family Medicine

## 2016-09-18 DIAGNOSIS — Z23 Encounter for immunization: Secondary | ICD-10-CM

## 2016-09-18 DIAGNOSIS — Z0000 Encounter for general adult medical examination without abnormal findings: Secondary | ICD-10-CM | POA: Insufficient documentation

## 2016-09-18 NOTE — Progress Notes (Signed)
Pt here for 3rd dose:Hep B vaccination.

## 2016-09-19 ENCOUNTER — Encounter (HOSPITAL_COMMUNITY): Payer: Self-pay | Admitting: Emergency Medicine

## 2016-09-19 ENCOUNTER — Emergency Department (HOSPITAL_COMMUNITY): Payer: Self-pay

## 2016-09-19 ENCOUNTER — Emergency Department (HOSPITAL_COMMUNITY)
Admission: EM | Admit: 2016-09-19 | Discharge: 2016-09-19 | Disposition: A | Discharge status: Home / Self Care | Payer: Self-pay | Attending: Emergency Medicine | Admitting: Emergency Medicine

## 2016-09-19 DIAGNOSIS — R519 Headache, unspecified: Secondary | ICD-10-CM

## 2016-09-19 DIAGNOSIS — Y92411 Interstate highway as the place of occurrence of the external cause: Secondary | ICD-10-CM | POA: Insufficient documentation

## 2016-09-19 DIAGNOSIS — Y93I9 Activity, other involving external motion: Secondary | ICD-10-CM | POA: Insufficient documentation

## 2016-09-19 DIAGNOSIS — Y999 Unspecified external cause status: Secondary | ICD-10-CM | POA: Insufficient documentation

## 2016-09-19 DIAGNOSIS — G43909 Migraine, unspecified, not intractable, without status migrainosus: Secondary | ICD-10-CM | POA: Insufficient documentation

## 2016-09-19 DIAGNOSIS — R42 Dizziness and giddiness: Secondary | ICD-10-CM | POA: Insufficient documentation

## 2016-09-19 DIAGNOSIS — R51 Headache: Secondary | ICD-10-CM

## 2016-09-19 LAB — BASIC METABOLIC PANEL
ANION GAP: 8 (ref 5–15)
BUN: 14 mg/dL (ref 6–20)
CALCIUM: 8.7 mg/dL — AB (ref 8.9–10.3)
CO2: 22 mmol/L (ref 22–32)
Chloride: 107 mmol/L (ref 101–111)
Creatinine, Ser: 0.57 mg/dL (ref 0.44–1.00)
GFR calc non Af Amer: >60 mL/min (ref >60)
Glucose, Bld: 115 mg/dL — ABNORMAL HIGH (ref 65–99)
Potassium: 3.8 mmol/L (ref 3.5–5.1)
Sodium: 137 mmol/L (ref 135–145)

## 2016-09-19 LAB — CBC
HCT: 40.5 % (ref 36.0–46.0)
HEMOGLOBIN: 13.7 g/dL (ref 12.0–15.0)
MCH: 30 pg (ref 26.0–34.0)
MCHC: 33.8 g/dL (ref 30.0–36.0)
MCV: 88.8 fL (ref 78.0–100.0)
Platelets: 234 10*3/uL (ref 150–400)
RBC: 4.56 MIL/uL (ref 3.87–5.11)
RDW: 12.9 % (ref 11.5–15.5)
WBC: 7.7 10*3/uL (ref 4.0–10.5)

## 2016-09-19 LAB — I-STAT BETA HCG BLOOD, ED (MC, WL, AP ONLY)

## 2016-09-19 LAB — CBG MONITORING, ED: Glucose-Capillary: 105 mg/dL — ABNORMAL HIGH (ref 65–99)

## 2016-09-19 MED ORDER — SODIUM CHLORIDE 0.9 % IV BOLUS (SEPSIS)
500.0000 mL | Freq: Once | INTRAVENOUS | Status: AC
  Administered 2016-09-19: 500 mL via INTRAVENOUS

## 2016-09-19 MED ORDER — METOCLOPRAMIDE HCL 5 MG/ML IJ SOLN
10.0000 mg | Freq: Once | INTRAMUSCULAR | Status: AC
  Administered 2016-09-19: 10 mg via INTRAVENOUS
  Filled 2016-09-19: qty 2

## 2016-09-19 MED ORDER — DIPHENHYDRAMINE HCL 50 MG/ML IJ SOLN
25.0000 mg | Freq: Once | INTRAMUSCULAR | Status: AC
  Administered 2016-09-19: 25 mg via INTRAVENOUS
  Filled 2016-09-19: qty 1

## 2016-09-19 MED ORDER — KETOROLAC TROMETHAMINE 30 MG/ML IJ SOLN
30.0000 mg | Freq: Once | INTRAMUSCULAR | Status: AC
Start: 2016-09-19 — End: 2016-09-19
  Administered 2016-09-19: 30 mg via INTRAVENOUS
  Filled 2016-09-19: qty 1

## 2016-09-19 MED ORDER — DEXAMETHASONE SODIUM PHOSPHATE 10 MG/ML IJ SOLN
10.0000 mg | Freq: Once | INTRAMUSCULAR | Status: AC
  Administered 2016-09-19: 10 mg via INTRAVENOUS
  Filled 2016-09-19: qty 1

## 2016-09-19 MED ORDER — MECLIZINE HCL 25 MG PO TABS: 25.0000 mg | ORAL_TABLET | Freq: Three times a day (TID) | ORAL | 0 refills | Status: DC | PRN

## 2016-09-19 MED ORDER — ONDANSETRON 4 MG PO TBDP: 4.0000 mg | ORAL_TABLET | Freq: Three times a day (TID) | ORAL | 0 refills | Status: DC | PRN

## 2016-09-19 MED ORDER — IBUPROFEN 600 MG PO TABS: 600.0000 mg | ORAL_TABLET | Freq: Four times a day (QID) | ORAL | 0 refills | Status: DC | PRN

## 2016-09-19 NOTE — ED Notes (Signed)
Pt drinking ginger ale at present
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
## 2016-09-19 NOTE — ED Notes (Signed)
Pt ambulated with 1 staff assist but was c/o dizziness and placed in wheel chair; PA aware
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
## 2016-09-19 NOTE — ED Notes (Signed)
PT denies translator to discuss discharge papers. PT discusses discharge in english with this nurse. PT has no questions at discharge.

## 2016-09-19 NOTE — Discharge Instructions (Signed)
Please rest and take Ibuprofen for headache Take Meclizine for dizziness Take Zofran for nausea as needed Follow up with your doctor

## 2016-10-13 ENCOUNTER — Ambulatory Visit: Payer: Self-pay | Attending: Family Medicine

## 2016-10-27 ENCOUNTER — Encounter: Payer: Self-pay | Admitting: Family Medicine

## 2016-10-27 ENCOUNTER — Ambulatory Visit: Payer: Self-pay | Attending: Family Medicine | Admitting: Family Medicine

## 2016-10-27 VITALS — BP 111/72 | HR 83 | Temp 98.7°F | Resp 18 | Ht 62.0 in | Wt 359.0 lb

## 2016-10-27 DIAGNOSIS — L918 Other hypertrophic disorders of the skin: Secondary | ICD-10-CM

## 2016-10-27 DIAGNOSIS — Z79899 Other long term (current) drug therapy: Secondary | ICD-10-CM | POA: Insufficient documentation

## 2016-10-27 DIAGNOSIS — Z7984 Long term (current) use of oral hypoglycemic drugs: Secondary | ICD-10-CM | POA: Insufficient documentation

## 2016-10-27 DIAGNOSIS — R7303 Prediabetes: Secondary | ICD-10-CM | POA: Insufficient documentation

## 2016-10-27 NOTE — Progress Notes (Signed)
Subjective:  Patient ID: Stacie Mitchell, female    DOB: February 14, 1986  Age: 31 y.o. MRN: [id number]  CC: Skin Problem   HPI Stacie Mitchell has morbid obesity, prediabetes she presents for skin tag removal:  1. Skin tags: on trunk, neck and left arm. Skin tags are irritated. She has had some since childhood. Especially the large one on her left arm.   Social History  Substance Use Topics  . Smoking status: Never Smoker  . Smokeless tobacco: Never Used  . Alcohol use No    Outpatient Medications Prior to Visit  Medication Sig Dispense Refill  . Acetaminophen (TYLENOL PO) Take 2 tablets by mouth every 6 (six) hours.    Marland Kitchen ibuprofen (ADVIL,MOTRIN) 600 MG tablet Take 1 tablet (600 mg total) by mouth every 6 (six) hours as needed. 30 tablet 0  . meclizine (ANTIVERT) 25 MG tablet Take 1 tablet (25 mg total) by mouth 3 (three) times daily as needed for dizziness. 30 tablet 0  . amitriptyline (ELAVIL) 50 MG tablet Take 1 tablet (50 mg total) by mouth at bedtime. 30 tablet 3  . ketoconazole (NIZORAL) 2 % cream APPLY 1 APPLICATION TOPICALLY DAILY. (Patient not taking: Reported on 09/19/2016) 60 g 1  . lisinopril (PRINIVIL,ZESTRIL) 10 MG tablet Take 1 tablet (10 mg total) by mouth daily. (Patient not taking: Reported on 09/19/2016) 90 tablet 3  . metFORMIN (GLUCOPHAGE) 1000 MG tablet Take 1 tablet (1,000 mg total) by mouth 2 (two) times daily with a meal. (Patient not taking: Reported on 05/15/2016) 180 tablet 3  . neomycin-bacitracin-polymyxin (NEOSPORIN) ointment Apply 1 application topically every 12 (twelve) hours. apply to eye (Patient not taking: Reported on 04/04/2016) 15 g 0  . ondansetron (ZOFRAN ODT) 4 MG disintegrating tablet Take 1 tablet (4 mg total) by mouth every 8 (eight) hours as needed for nausea or vomiting. 8 tablet 0  . terbinafine (LAMISIL) 250 MG tablet TAKE 1 TABLET (250 MG TOTAL) BY MOUTH DAILY. (Patient not taking: Reported on 05/15/2016) 90 tablet 0   No  facility-administered medications prior to visit.     ROS Review of Systems  Constitutional: Negative for chills and fever.  Eyes: Negative for visual disturbance.  Respiratory: Negative for shortness of breath.   Cardiovascular: Negative for chest pain.  Gastrointestinal: Negative for abdominal pain and blood in stool.  Musculoskeletal: Negative for arthralgias and back pain.  Skin: Negative for rash.  Allergic/Immunologic: Negative for immunocompromised state.  Hematological: Negative for adenopathy. Does not bruise/bleed easily.  Psychiatric/Behavioral: Negative for dysphoric mood and suicidal ideas.    Objective:  BP 111/72 (BP Location: Right Arm, Patient Position: Sitting, Cuff Size: Large)   Pulse 83   Temp 98.7 F (37.1 C) (Oral)   Resp 18   Ht 5\' 2"  (1.575 m)   Wt (!) 359 lb (162.8 kg)   SpO2 98%   BMI 65.66 kg/m   BP/Weight 10/27/2016 09/19/2016 05/15/2016  Systolic BP 111 126 143  Diastolic BP 72 87 86  Wt. (Lbs) 359 360 355  BMI 65.66 58.11 64.93    Physical Exam  Constitutional: She is oriented to person, place, and time. She appears well-developed and well-nourished. No distress.  HENT:  Head: Normocephalic and atraumatic.  Cardiovascular: Normal rate, regular rhythm, normal heart sounds and intact distal pulses.   Pulmonary/Chest: Effort normal and breath sounds normal.  Musculoskeletal: She exhibits no edema.  Neurological: She is alert and oriented to person, place, and time.  Skin: Skin is warm and dry. No rash noted.     Psychiatric: She has a normal mood and affect.    Skin tags are snipped off using Betadine for cleansing and sterile iris scissors. Local anesthesia, 1 % lidocaine with epinephrine was used. These pathognomonic lesions are not sent for pathology.  Lab Results  Component Value Date   HGBA1C 5.5 04/04/2016    Assessment & Plan:  Stacie Mitchell was seen today for skin problem.  Diagnoses and all orders for this visit:  Skin  tag   There are no diagnoses linked to this encounter.  No orders of the defined types were placed in this encounter.   Follow-up: Return in about 2 weeks (around 11/10/2016) for skin tag removal.   Dessa PhiJosalyn Bram Hottel MD

## 2016-10-27 NOTE — Patient Instructions (Signed)
Diagnoses and all orders for this visit:  Skin tag   Skin tags removed today Wait 2 hrs then bathe Reapply bandaid with antibiotic ointment Keep covered until new skin forms  Call if there is strong pain, redness, swelling or discharge  F/u in 2 weeks for skin tag removal from neck  Dr. Armen PickupFunches   Skin Tag, Adult A skin tag (acrochordon) is a soft, extra growth of skin. Most skin tags are flesh-colored and rarely bigger than a pencil eraser. They commonly form near areas where there are folds in the skin, such as the armpit or groin. Skin tags are not dangerous, and they do not spread from person to person (are not contagious). You may have one skin tag or several. Skin tags do not require treatment. However, your health care provider may recommend removal of a skin tag if it:  Gets irritated from clothing.  Bleeds.  Is visible and unsightly.  Your health care provider can remove skin tags with a simple surgical procedure or a procedure that involves freezing the skin tag. Follow these instructions at home:  Watch for any changes in your skin tag. A normal skin tag does not require any other special care at home.  Take over-the-counter and prescription medicines only as told by your health care provider.  Keep all follow-up visits as told by your health care provider. This is important. Contact a health care provider if:  You have a skin tag that: ? Becomes painful. ? Changes color. ? Bleeds. ? Swells.  You develop more skin tags. This information is not intended to replace advice given to you by your health care provider. Make sure you discuss any questions you have with your health care provider. Document Released: 04/15/2015 Document Revised: 11/25/2015 Document Reviewed: 04/15/2015 Elsevier Interactive Patient Education  [employer]2018 [employer]

## 2016-11-11 ENCOUNTER — Encounter: Payer: Self-pay | Admitting: Family Medicine

## 2016-11-11 ENCOUNTER — Ambulatory Visit: Payer: Self-pay | Attending: Family Medicine | Admitting: Family Medicine

## 2016-11-11 VITALS — BP 137/74 | HR 92 | Temp 98.3°F | Ht 62.0 in | Wt 356.0 lb

## 2016-11-11 DIAGNOSIS — R7303 Prediabetes: Secondary | ICD-10-CM | POA: Insufficient documentation

## 2016-11-11 DIAGNOSIS — L918 Other hypertrophic disorders of the skin: Secondary | ICD-10-CM | POA: Insufficient documentation

## 2016-11-11 NOTE — Progress Notes (Signed)
Subjective:  Patient ID: Stacie Mitchell, female    DOB: June 15, 1985  Age: 31 y.o. MRN: [id number]  CC: Skin Problem   HPI Stacie Mitchell has morbid obesity, prediabetes she presents for skin tag removal:  1. Skin tags: she is healing from last skin tag removal. She request removal of additional skin tags on right upper thigh and left lateral neck.   Social History  Substance Use Topics  . Smoking status: Never Smoker  . Smokeless tobacco: Never Used  . Alcohol use No    Outpatient Medications Prior to Visit  Medication Sig Dispense Refill  . Acetaminophen (TYLENOL PO) Take 2 tablets by mouth every 6 (six) hours.    Marland Kitchen. amitriptyline (ELAVIL) 50 MG tablet Take 1 tablet (50 mg total) by mouth at bedtime. 30 tablet 3  . ibuprofen (ADVIL,MOTRIN) 600 MG tablet Take 1 tablet (600 mg total) by mouth every 6 (six) hours as needed. 30 tablet 0  . meclizine (ANTIVERT) 25 MG tablet Take 1 tablet (25 mg total) by mouth 3 (three) times daily as needed for dizziness. 30 tablet 0  . ondansetron (ZOFRAN ODT) 4 MG disintegrating tablet Take 1 tablet (4 mg total) by mouth every 8 (eight) hours as needed for nausea or vomiting. 8 tablet 0   No facility-administered medications prior to visit.     ROS Review of Systems  Constitutional: Negative for chills and fever.  Eyes: Negative for visual disturbance.  Respiratory: Negative for shortness of breath.   Cardiovascular: Negative for chest pain.  Gastrointestinal: Negative for abdominal pain and blood in stool.  Musculoskeletal: Negative for arthralgias and back pain.  Skin: Negative for rash.  Allergic/Immunologic: Negative for immunocompromised state.  Hematological: Negative for adenopathy. Does not bruise/bleed easily.  Psychiatric/Behavioral: Negative for dysphoric mood and suicidal ideas.    Objective:  BP 137/74   Pulse 92   Temp 98.3 F (36.8 C) (Oral)   Ht 5\' 2"  (1.575 m)   Wt (!) 356 lb (161.5 kg)   SpO2 98%    BMI 65.11 kg/m   BP/Weight 11/11/2016 10/27/2016 09/19/2016  Systolic BP 137 111 126  Diastolic BP 74 72 87  Wt. (Lbs) 356 359 360  BMI 65.11 65.66 58.11    Physical Exam  Constitutional: She is oriented to person, place, and time. She appears well-developed and well-nourished. No distress.  HENT:  Head: Normocephalic and atraumatic.  Cardiovascular: Normal rate, regular rhythm, normal heart sounds and intact distal pulses.   Pulmonary/Chest: Effort normal and breath sounds normal.  Musculoskeletal: She exhibits no edema.  Neurological: She is alert and oriented to person, place, and time.  Skin: Skin is warm and dry. No rash noted.     Psychiatric: She has a normal mood and affect.    Skin tags from right upper thigh and left lateral neck  are snipped off using Betadine for cleansing and sterile iris scissors. Local anesthesia, 1 % lidocaine with epinephrine was used. These pathognomonic lesions are not sent for pathology.  Lab Results  Component Value Date   HGBA1C 5.5 04/04/2016    Assessment & Plan:  Stacie Mitchell was seen today for skin problem.  Diagnoses and all orders for this visit:  Skin tag   There are no diagnoses linked to this encounter.  No orders of the defined types were placed in this encounter.   Follow-up: Return in about 2 months (around 01/11/2017) for pap smear .   Dessa PhiJosalyn Leverne Amrhein MD

## 2016-11-11 NOTE — Patient Instructions (Addendum)
Diagnoses and all orders for this visit:  Skin tag   Skin tags removed today Wait 2 hrs then bathe Reapply bandaid with antibiotic ointment Keep covered until new skin forms  Call if there is strong pain, redness, swelling or discharge  Wt Readings from Last 3 Encounters:  11/11/16 (!) 356 lb (161.5 kg)  10/27/16 (!) 359 lb (162.8 kg)  09/19/16 (!) 360 lb (163.3 kg)     Dr. Armen PickupFunches   Skin Tag, Adult A skin tag (acrochordon) is a soft, extra growth of skin. Most skin tags are flesh-colored and rarely bigger than a pencil eraser. They commonly form near areas where there are folds in the skin, such as the armpit or groin. Skin tags are not dangerous, and they do not spread from person to person (are not contagious). You may have one skin tag or several. Skin tags do not require treatment. However, your health care provider may recommend removal of a skin tag if it:  Gets irritated from clothing.  Bleeds.  Is visible and unsightly.  Your health care provider can remove skin tags with a simple surgical procedure or a procedure that involves freezing the skin tag. Follow these instructions at home:  Watch for any changes in your skin tag. A normal skin tag does not require any other special care at home.  Take over-the-counter and prescription medicines only as told by your health care provider.  Keep all follow-up visits as told by your health care provider. This is important. Contact a health care provider if:  You have a skin tag that: ? Becomes painful. ? Changes color. ? Bleeds. ? Swells.  You develop more skin tags. This information is not intended to replace advice given to you by your health care provider. Make sure you discuss any questions you have with your health care provider. Document Released: 04/15/2015 Document Revised: 11/25/2015 Document Reviewed: 04/15/2015 Elsevier Interactive Patient Education  [employer]2018 Elsevier Inc.

## 2016-11-12 DIAGNOSIS — L918 Other hypertrophic disorders of the skin: Secondary | ICD-10-CM | POA: Insufficient documentation

## 2016-12-01 ENCOUNTER — Telehealth: Payer: Self-pay | Admitting: Family Medicine

## 2016-12-01 ENCOUNTER — Other Ambulatory Visit: Payer: Self-pay | Admitting: Pharmacist

## 2016-12-01 MED ORDER — MECLIZINE HCL 25 MG PO TABS: 25.0000 mg | ORAL_TABLET | Freq: Three times a day (TID) | ORAL | 0 refills | Status: DC | PRN

## 2016-12-01 NOTE — Telephone Encounter (Signed)
Patient called requesting medication refill on meclizine (ANTIVERT) 25 MG tablet Pt states it has really helped her with dizziness, please f/up

## 2016-12-01 NOTE — Telephone Encounter (Signed)
Meclizine refilled.

## 2016-12-10 ENCOUNTER — Other Ambulatory Visit: Payer: Self-pay | Admitting: Physician Assistant

## 2016-12-10 ENCOUNTER — Telehealth: Payer: Self-pay | Admitting: Family Medicine

## 2016-12-10 NOTE — Telephone Encounter (Signed)
Pt called to speak with the nurse since he has question about her BP med, please call her back

## 2016-12-11 ENCOUNTER — Other Ambulatory Visit: Payer: Self-pay | Admitting: *Deleted

## 2016-12-11 DIAGNOSIS — G44229 Chronic tension-type headache, not intractable: Secondary | ICD-10-CM

## 2016-12-11 MED ORDER — AMITRIPTYLINE HCL 50 MG PO TABS: 50.0000 mg | ORAL_TABLET | Freq: Every day | ORAL | 2 refills | Status: DC

## 2016-12-11 MED FILL — ?AMITRIPTYLINE HCL 50MG TAB: 50 | 30 days supply | Qty: 30 | Fill #0

## 2016-12-11 NOTE — Telephone Encounter (Signed)
Patient came in office on 12/11/2016 patient stated that [facility] they told her her BP was elevated   Patient wanted to know if she can get her BP med that she she was taking before  CMA advice that she needs to make a f/up visit with us for HTN problem   Patient was aware and understood

## 2016-12-12 ENCOUNTER — Ambulatory Visit: Payer: Self-pay | Admitting: Licensed Clinical Social Worker

## 2016-12-12 ENCOUNTER — Telehealth: Payer: Self-pay | Admitting: *Deleted

## 2016-12-12 ENCOUNTER — Ambulatory Visit: Payer: Self-pay | Attending: Internal Medicine | Admitting: *Deleted

## 2016-12-12 VITALS — BP 136/93 | HR 89 | Resp 20

## 2016-12-12 DIAGNOSIS — R51 Headache: Secondary | ICD-10-CM | POA: Insufficient documentation

## 2016-12-12 DIAGNOSIS — Z0130 Encounter for examination of blood pressure without abnormal findings: Secondary | ICD-10-CM | POA: Insufficient documentation

## 2016-12-12 DIAGNOSIS — F439 Reaction to severe stress, unspecified: Secondary | ICD-10-CM

## 2016-12-12 DIAGNOSIS — F419 Anxiety disorder, unspecified: Secondary | ICD-10-CM | POA: Insufficient documentation

## 2016-12-12 NOTE — BH Specialist Note (Signed)
[facility]CSWA introduced self and [facility].   Pt disclosed that she recently quit her job due to ongoing stress regarding safety concerns. She is experiencing financial strain. Pt receives support from partner.   LCSWA educated pt on the correlation between one's physical and mental health. LCSWA discussed benefits of applying healthy coping skills to decrease symptoms. Pt successfully identified healthy strategies to improve medication compliance for hypertension and cope with stressors.   LCSWA provided pt with resources for labor and psychotherapy. Pt was encouraged to contact LCSWA if symptoms worsen or fail to improve to schedule [facility].

## 2016-12-12 NOTE — Telephone Encounter (Signed)
Pt aware of appointment with provider: December 19, 2016 at 0915. Pt verbalized understanding.

## 2016-12-12 NOTE — Progress Notes (Signed)
Patient Triage Assessment Form Todays Date: 12/12/2016 Name: Karl BalesRosa De La Rosa Ortega DOB: 11/28/1985 Reason for walkin: pt came to front desk  When did your symptoms start?  Please list symptoms: pt came into office for medication and to pick up prescription. While asking about appointment, Front desk personnel noticed her hands very wet and sweaty. He then informed writer to triage patient Are you having pain: no  Assessment Pt is alert and orient, NAD assessed. Denies pain. Vital signs are WNL. She discloses stressors related to her life: job, safety at her job, children, finances.CSW assisted patient with information and resources. Pt not SI or HI. Pt  went to pharmacy to take her BP and saw her reading was elevated. She had headaches and a fear that something may happen to her while asleep. She started back taking Elavil. Pt would like appointment scheduled before September 18. Awaiting provider approval.   Vital Signs:  T: P: 89  R: 20  SpO2: 97  BP: 136/93   Plan Pt would like appointment scheduled before September 18. Awaiting provider approval.   December 19, 2016 at 0915

## 2016-12-12 NOTE — Addendum Note (Signed)
Addended by: Guy FrancoBENJAMIN, Safir Michalec on: 12/12/2016 04:41 PM   Modules accepted: Level of Service

## 2016-12-19 ENCOUNTER — Encounter: Payer: Self-pay | Admitting: Family Medicine

## 2016-12-19 ENCOUNTER — Ambulatory Visit: Payer: Self-pay | Attending: Family Medicine | Admitting: Family Medicine

## 2016-12-19 VITALS — BP 100/66 | HR 104 | Temp 98.9°F | Resp 18 | Ht 63.0 in | Wt 362.4 lb

## 2016-12-19 DIAGNOSIS — G43909 Migraine, unspecified, not intractable, without status migrainosus: Secondary | ICD-10-CM | POA: Insufficient documentation

## 2016-12-19 DIAGNOSIS — I10 Essential (primary) hypertension: Secondary | ICD-10-CM | POA: Insufficient documentation

## 2016-12-19 DIAGNOSIS — IMO0002 Reserved for concepts with insufficient information to code with codable children: Secondary | ICD-10-CM

## 2016-12-19 DIAGNOSIS — G43709 Chronic migraine without aura, not intractable, without status migrainosus: Secondary | ICD-10-CM

## 2016-12-19 DIAGNOSIS — Z789 Other specified health status: Secondary | ICD-10-CM

## 2016-12-19 MED ORDER — BUTALBITAL-APAP-CAFFEINE 50-325-40 MG PO TABS: 1.0000 | ORAL_TABLET | Freq: Two times a day (BID) | ORAL | 0 refills | Status: DC | PRN

## 2016-12-19 MED ORDER — IBUPROFEN 600 MG PO TABS: 600.0000 mg | ORAL_TABLET | Freq: Four times a day (QID) | ORAL | 0 refills | Status: DC | PRN

## 2016-12-19 MED FILL — IBUPROFEN 600 MG TABLET: 600 | 10 days supply | Qty: 40 | Fill #0

## 2016-12-19 MED FILL — BUTALB-ACETAMIN-CAFF 50-325: 50-325-40 | 15 days supply | Qty: 30 | Fill #0

## 2016-12-19 NOTE — Progress Notes (Signed)
Patient is here for anxiety high BP   Patient stated that she is taking BP med but doesn't know the name of it   Patient has not taking her BP med for today   Patient has eaten for today

## 2016-12-19 NOTE — Patient Instructions (Signed)
Cefalea migraosa  (Migraine Headache)  Una cefalea migraosa es un dolor intenso y punzante en uno o ambos lados de la cabeza. Las migraas tambin pueden causar otros sntomas, como nuseas, vmitos y sensibilidad a la luz y el ruido.  CAUSAS  Hay ciertos factores que pueden provocar migraas, como los siguientes:   Alcohol.   Fumar.   Medicamentos, por ejemplo:  ? Medicamentos para aliviar el dolor torcico (nitroglicerina).  ? Pldoras anticonceptivas.  ? Comprimidos de estrgeno.  ? Ciertos medicamentos para la presin arterial.   Quesos curados, chocolate o cafena.   Los alimentos o las bebidas que contienen nitratos, glutamato, aspartamo o tiramina.   Realizar actividad fsica.  Otros factores que pueden provocar migraa incluyen los siguientes:   Menstruacin.   Embarazo.   Hambre.   Estrs, poco o demasiado sueo, o fatiga.   Cambios climticos.  FACTORES DE RIESGO  Los siguientes factores pueden hacer que usted sea ms propenso a tener migraas:   La edad. Los riesgos aumentan con la edad.   Antecedentes familiares de migraa.   Ser de raza caucsica.   Depresin y ansiedad.   Obesidad.   Ser mujer.   Tener un agujero en el corazn (persistencia del agujero oval) u otros problemas cardacos.  SNTOMAS  El principal sntoma de esta afeccin es el dolor intenso y punzante. El dolor:   Puede aparecer en cualquier regin de la cabeza, tanto de un lado como de ambos.   Puede interferir con las actividades de la vida cotidiana.   Puede empeorar con la actividad fsica.   Puede empeorar ante la exposicin a luces brillantes o a ruidos fuertes.  Otros sntomas pueden incluir lo siguiente:   Nuseas.   Vmitos.   Mareos.   Sensibilidad general a las luces brillantes, a los ruidos fuertes o a los olores.  Antes de sufrir una migraa, puede percibir seales de advertencia (aura). Un aura puede incluir:   Ver luces intermitentes o tener puntos ciegos.   Ver puntos brillantes, halos o  lneas en zigzag.   Tener una visin en tnel o visin borrosa.   Sentir entumecimiento u hormigueo.   Tener dificultad para hablar.   Debilidad muscular.  DIAGNSTICO  La cefalea migraosa se diagnostica en funcin de lo siguiente:   Sus sntomas.   Un examen fsico.   Estudios, como una tomografa computarizada o una resonancia magntica de la cabeza. Estos estudios de diagnstico por imgenes pueden ayudar a descartar otras causas de cefalea.   Una muestra de lquido cefalorraqudeo (puncin lumbar) para analizar (anlisis de lquido cefalorraqudeo o anlisis de LCR).  TRATAMIENTO  Las cefaleas migraosas suelen tratarse con medicamentos que:   Alivian el dolor.   Alivian las nuseas.   Evitan la recurrencia de las migraas.  El tratamiento tambin puede incluir lo siguiente:   Acupuntura.   Cambios en el estilo de vida, como evitar alimentos que provoquen migraa.  INSTRUCCIONES PARA EL CUIDADO EN EL HOGAR  Medicamentos   Tome los medicamentos de venta libre y los recetados solamente como se lo haya indicado el mdico.   No conduzca ni use maquinaria pesada mientras toma analgsicos recetados.   A fin de prevenir o tratar el estreimiento mientras toma analgsicos recetados, el mdico puede recomendarle lo siguiente:  ? Beba suficiente lquido para mantener la orina clara o de color amarillo plido.  ? Tomar medicamentos recetados o de venta libre.  ? Consumir alimentos ricos en fibra, como frutas y verduras frescas, cereales integrales   y frijoles.  ? Limitar el consumo de alimentos con alto contenido de grasas y azcares procesados, como alimentos fritos o dulces.  Estilo de vida   Evite el consumo de alcohol.   No consuma ningn producto que contenga nicotina o tabaco, como cigarrillos y cigarrillos electrnicos. Si necesita ayuda para dejar de fumar, consulte al mdico.   Duerma como mnimo 8horas todas las noches.   Evite las situaciones de estrs.  Instrucciones generales   Lleve un  registro diario para averiguar lo que puede provocar las cefaleas migraosas. Por ejemplo, escriba:  ? Lo que usted come y bebe.  ? Cunto tiempo duerme.  ? Algn cambio en su dieta o en los medicamentos.   Si tiene una migraa:  ? Evite los factores que empeoren los sntomas, como las luces brillantes.  ? Resulta til acostarse en una habitacin oscura y silenciosa.  ? No conduzca vehculos ni opere maquinaria pesada.  ? Pregntele al mdico qu actividades son seguras para usted cuando tiene sntomas.   Concurra a todas las visitas de control como se lo haya indicado el mdico. Esto es importante.  SOLICITE ATENCIN MDICA SI:   Tiene sntomas de migraa distintos o ms intensos que los habituales.    SOLICITE ATENCIN MDICA DE INMEDIATO SI:   La migraa se hace cada vez ms intensa.   Tiene fiebre.   Presenta rigidez en el cuello.   Tiene prdida de visin.   Siente debilidad en los msculos o que no puede controlarlos.   Comienza a perder el equilibrio con frecuencia.   Comienza a tener dificultades para caminar.   Se desmaya.    Esta informacin no tiene como fin reemplazar el consejo del mdico. Asegrese de hacerle al mdico cualquier pregunta que tenga.  Document Released: 03/31/2005 Document Revised: 01/19/2013 Document Reviewed: 09/17/2015  Elsevier Interactive Patient Education  2017 Elsevier Inc.
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
## 2016-12-30 ENCOUNTER — Ambulatory Visit: Payer: Self-pay | Admitting: Family Medicine

## 2017-01-12 MED FILL — ?AMITRIPTYLINE HCL 50MG TAB: 50 | 30 days supply | Qty: 30 | Fill #1

## 2017-01-13 ENCOUNTER — Telehealth: Payer: Self-pay | Admitting: Family Medicine

## 2017-01-13 ENCOUNTER — Other Ambulatory Visit: Payer: Self-pay | Admitting: Family Medicine

## 2017-01-13 DIAGNOSIS — IMO0002 Reserved for concepts with insufficient information to code with codable children: Secondary | ICD-10-CM

## 2017-01-13 DIAGNOSIS — G43709 Chronic migraine without aura, not intractable, without status migrainosus: Secondary | ICD-10-CM

## 2017-01-13 MED ORDER — BUTALBITAL-APAP-CAFFEINE 50-325-40 MG PO TABS: 1.0000 | ORAL_TABLET | Freq: Two times a day (BID) | ORAL | 0 refills | Status: DC | PRN

## 2017-01-13 NOTE — Telephone Encounter (Signed)
Script will be available in office for pick up.

## 2017-01-13 NOTE — Telephone Encounter (Signed)
Will forward to PCP 

## 2017-01-13 NOTE — Telephone Encounter (Signed)
Patient came into office requesting medication refill onbutalbital-acetaminophen-caffeine (FIORICET, ESGIC) 50-325-40 MG tablet Please f/up , pt states it is helping her.

## 2017-01-14 NOTE — Telephone Encounter (Signed)
CMA call regarding lab results   Patient did not answer but left a detailed on VM stating the reason of the call & to call back

## 2017-01-16 ENCOUNTER — Ambulatory Visit: Payer: Self-pay | Admitting: Family Medicine

## 2017-01-19 ENCOUNTER — Other Ambulatory Visit: Payer: Self-pay | Admitting: Family Medicine

## 2017-01-19 DIAGNOSIS — IMO0002 Reserved for concepts with insufficient information to code with codable children: Secondary | ICD-10-CM

## 2017-01-19 DIAGNOSIS — G43709 Chronic migraine without aura, not intractable, without status migrainosus: Secondary | ICD-10-CM

## 2017-03-02 ENCOUNTER — Telehealth: Payer: Self-pay | Admitting: Family Medicine

## 2017-03-02 ENCOUNTER — Ambulatory Visit: Payer: Self-pay | Admitting: Family Medicine

## 2017-03-02 MED ORDER — MECLIZINE HCL 25 MG PO TABS: 25.0000 mg | ORAL_TABLET | Freq: Three times a day (TID) | ORAL | 0 refills | Status: DC | PRN

## 2017-03-02 NOTE — Telephone Encounter (Signed)
Refilled

## 2017-03-02 NOTE — Telephone Encounter (Signed)
Pt appt was cancel today, she need a refill for ibuprofen (ADVIL,MOTRIN) 600 MG tablet  Please follow up

## 2017-03-02 NOTE — Telephone Encounter (Signed)
Pt appt was cancel she need a refill for meclizine (ANTIVERT) 25 MG tablet [facility]

## 2017-03-04 ENCOUNTER — Other Ambulatory Visit: Payer: Self-pay | Admitting: Family Medicine

## 2017-03-04 DIAGNOSIS — G43709 Chronic migraine without aura, not intractable, without status migrainosus: Secondary | ICD-10-CM

## 2017-03-04 DIAGNOSIS — IMO0002 Reserved for concepts with insufficient information to code with codable children: Secondary | ICD-10-CM

## 2017-03-04 MED ORDER — IBUPROFEN 600 MG PO TABS: 600.0000 mg | ORAL_TABLET | Freq: Four times a day (QID) | ORAL | 0 refills | Status: DC | PRN

## 2017-03-04 NOTE — Telephone Encounter (Signed)
Refilled

## 2017-03-09 NOTE — Telephone Encounter (Signed)
CMA call regarding medication  refill sent to pharmacy   Patient did not answer but left a VM stating the reason of the call & to call back

## 2017-03-10 MED FILL — BUTALB-ACETAMIN-CAFF 50-325: 50-325-40 | 15 days supply | Qty: 30 | Fill #0

## 2017-03-10 MED FILL — ?MECLIZINE 25 MG TABLET: 25 | 10 days supply | Qty: 30 | Fill #0

## 2017-04-16 ENCOUNTER — Ambulatory Visit: Payer: Self-pay | Admitting: Family Medicine

## 2017-04-22 ENCOUNTER — Ambulatory Visit: Payer: Self-pay

## 2017-04-28 ENCOUNTER — Ambulatory Visit: Payer: Self-pay | Attending: Family Medicine | Admitting: Family Medicine

## 2017-04-28 ENCOUNTER — Encounter: Payer: Self-pay | Admitting: Family Medicine

## 2017-04-28 VITALS — BP 133/80 | HR 67 | Temp 98.2°F | Resp 18 | Ht 63.0 in | Wt 359.0 lb

## 2017-04-28 DIAGNOSIS — R7303 Prediabetes: Secondary | ICD-10-CM

## 2017-04-28 DIAGNOSIS — Z87898 Personal history of other specified conditions: Secondary | ICD-10-CM

## 2017-04-28 DIAGNOSIS — L918 Other hypertrophic disorders of the skin: Secondary | ICD-10-CM

## 2017-04-28 DIAGNOSIS — N926 Irregular menstruation, unspecified: Secondary | ICD-10-CM

## 2017-04-28 DIAGNOSIS — Z79899 Other long term (current) drug therapy: Secondary | ICD-10-CM | POA: Insufficient documentation

## 2017-04-28 LAB — POCT GLYCOSYLATED HEMOGLOBIN (HGB A1C): HEMOGLOBIN A1C: 5.4

## 2017-04-28 LAB — POCT URINE PREGNANCY: Preg Test, Ur: NEGATIVE

## 2017-04-28 NOTE — Progress Notes (Signed)
Subjective:  Patient ID: Stacie Mitchell, female    DOB: [birth year]  Age: 32 y.o. MRN: [id number]  CC: Nevus   HPI Stacie Mitchell presents for skin complaint.  Cutaneous skin tags History of skin tags with removal in the past. Located face, neck, and posterior back. Associated symptoms include itching.   History of prediabetes History of prediabetes. She denies any foot ulcerations, nausea, paresthesia of the feet, polydipsia, polyuria, visual disturbances and vomitting.  Evaluation to date has been included: hemoglobin A1C.    Missed period She reports last menstrual cycle in November 2018. She does report history of irregular periods in the past. She is currently sexually active. Menstrual cycles typically last 5 days. She denies any history of PCOS . She does report recent stress.    Outpatient Medications Prior to Visit  Medication Sig Dispense Refill  . Acetaminophen (TYLENOL PO) Take 2 tablets by mouth every 6 (six) hours.    . butalbital-acetaminophen-caffeine (FIORICET, ESGIC) 50-325-40 MG tablet Take 1 tablet by mouth 2 (two) times daily as needed for migraine. 30 tablet 0  . ibuprofen (ADVIL,MOTRIN) 600 MG tablet Take 1 tablet (600 mg total) by mouth every 6 (six) hours as needed for headache (Take with food.). 40 tablet 0  . meclizine (ANTIVERT) 25 MG tablet Take 1 tablet (25 mg total) 3 (three) times daily as needed by mouth for dizziness. 30 tablet 0  . amitriptyline (ELAVIL) 50 MG tablet Take 1 tablet (50 mg total) by mouth at bedtime. 30 tablet 2  . ondansetron (ZOFRAN ODT) 4 MG disintegrating tablet Take 1 tablet (4 mg total) by mouth every 8 (eight) hours as needed for nausea or vomiting. (Patient not taking: Reported on 12/12/2016) 8 tablet 0   No facility-administered medications prior to visit.     ROS Review of Systems  Constitutional: Negative.   Eyes: Negative for visual disturbance.  Respiratory: Negative.   Cardiovascular: Negative.     Genitourinary: Positive for menstrual problem.  Skin: Negative.     Objective:  BP 133/80 (BP Location: Right Arm, Patient Position: Sitting, Cuff Size: Large)   Pulse 67   Temp 98.2 F (36.8 C) (Oral)   Resp 18   Ht 5\' 3"  (1.6 m)   Wt (!) 359 lb (162.8 kg)   LMP 03/29/2017   SpO2 100%   BMI 63.59 kg/m   BP/Weight 04/28/2017 12/19/2016 12/12/2016  Systolic BP 133 100 136  Diastolic BP 80 66 93  Wt. (Lbs) 359 362.4 -  BMI 63.59 64.2 -    Physical Exam  Constitutional: She appears well-developed and well-nourished.  Eyes: Conjunctivae are normal. Pupils are equal, round, and reactive to light.  Cardiovascular: Normal rate, regular rhythm, normal heart sounds and intact distal pulses.  Pulmonary/Chest: Effort normal and breath sounds normal.  Abdominal: Soft. Bowel sounds are normal. There is no tenderness.  Skin: Skin is warm and dry.  Multiple skin tags to face, neck, and posterior back.  Psychiatric: She has a normal mood and affect.  Nursing note and vitals reviewed.   Assessment & Plan:   1. Cutaneous skin tags  - Ambulatory referral to Dermatology  2. History of prediabetes Hgba1c 5.4 in office. Encouraged heathy lifestyle diet and exercise. - HgB A1c  3. Missed period Urine preg. Negative. F/u for further workup as needed if symptom continues. - POCT urine pregnancy      Follow-up: Return As needed.   Lizbeth BarkMandesia R Sharlyne Koeneman FNP

## 2017-04-28 NOTE — Patient Instructions (Signed)
Renew orange card.   Papiloma cutneo en los adultos Skin Tag, Adult Un papiloma cutneo (acrocordn) es un crecimiento extra de piel suave. La mayora de los papilomas cutneos tienen el mismo color de la piel y no son ms grandes que la goma de un lpiz. Por lo general, se forman en zonas donde hay pliegues de la piel, como la axila o la ingle. Los papilomas cutneos no son peligrosos y no se transmiten de Neomia Dearuna persona a Stacie Mitchell (no son contagiosos). Puede tener un solo papiloma cutneo o varios. Los papilomas cutneos no requieren TEFL teachertratamiento. Sin embargo, el mdico puede recomendarle que se lo quite si:  Se irrita con el roce de la ropa.  Sangra.  Se ve y tiene un aspecto antiesttico.  El mdico puede quitar el papiloma cutneo mediante un procedimiento quirrgico simple o mediante un procedimiento que implica el congelamiento del papiloma. Siga estas indicaciones en su casa:  Controle el papiloma para detectar cualquier cambio. Un papiloma cutneo normal no requiere ningn otro cuidado especial en el hogar.  Tome los medicamentos de venta libre y los recetados solamente como se lo haya indicado el mdico.  OceanographerConcurra a todas las visitas de control como se lo haya indicado el mdico. Esto es importante. Comunquese con un mdico si:  Tiene un papiloma cutneo que: ? Duele. ? Cambia de color. ? Sangra. ? Se hincha.  Le aparecen ms papilomas cutneos. Esta informacin no tiene Theme park managercomo fin reemplazar el consejo del mdico. Asegrese de hacerle al mdico cualquier pregunta que tenga. Document Released: 08/13/2016 Document Revised: 08/13/2016 Document Reviewed: 04/15/2015 Elsevier Interactive Patient Education  2018 [employer]

## 2017-04-28 NOTE — Progress Notes (Signed)
?????? ??? ??   5? 4?? ?? ?? ?? ????? ?? ??? ????.  ?? ? ?? ???????? ???????? ??? ?? ???? ?????. ???? ?? ???! ???

## 2017-05-22 ENCOUNTER — Ambulatory Visit: Payer: Self-pay | Attending: Family Medicine

## 2017-06-25 ENCOUNTER — Ambulatory Visit (INDEPENDENT_AMBULATORY_CARE_PROVIDER_SITE_OTHER): Payer: Self-pay | Admitting: Physician Assistant

## 2017-06-25 ENCOUNTER — Other Ambulatory Visit: Payer: Self-pay

## 2017-06-25 ENCOUNTER — Encounter (INDEPENDENT_AMBULATORY_CARE_PROVIDER_SITE_OTHER): Payer: Self-pay | Admitting: Physician Assistant

## 2017-06-25 VITALS — BP 115/77 | HR 74 | Temp 97.8°F | Wt 359.2 lb

## 2017-06-25 DIAGNOSIS — R519 Headache, unspecified: Secondary | ICD-10-CM

## 2017-06-25 DIAGNOSIS — R300 Dysuria: Secondary | ICD-10-CM

## 2017-06-25 DIAGNOSIS — L918 Other hypertrophic disorders of the skin: Secondary | ICD-10-CM

## 2017-06-25 DIAGNOSIS — M546 Pain in thoracic spine: Secondary | ICD-10-CM

## 2017-06-25 DIAGNOSIS — R51 Headache: Secondary | ICD-10-CM

## 2017-06-25 DIAGNOSIS — R42 Dizziness and giddiness: Secondary | ICD-10-CM

## 2017-06-25 DIAGNOSIS — J322 Chronic ethmoidal sinusitis: Secondary | ICD-10-CM

## 2017-06-25 DIAGNOSIS — N3001 Acute cystitis with hematuria: Secondary | ICD-10-CM

## 2017-06-25 LAB — POCT URINALYSIS DIPSTICK
Glucose, UA: NEGATIVE
Leukocytes, UA: NEGATIVE
Nitrite, UA: POSITIVE
Protein, UA: >300
Spec Grav, UA: 1.025 (ref 1.010–1.025)
Urobilinogen, UA: 0.2 E.U./dL
pH, UA: 6.5 (ref 5.0–8.0)

## 2017-06-25 MED ORDER — NAPROXEN 500 MG PO TABS: 500.0000 mg | ORAL_TABLET | Freq: Two times a day (BID) | ORAL | 0 refills | Status: DC

## 2017-06-25 MED ORDER — TOPIRAMATE 25 MG PO TABS: 25.0000 mg | ORAL_TABLET | Freq: Two times a day (BID) | ORAL | 2 refills | Status: DC

## 2017-06-25 MED ORDER — FLUTICASONE PROPIONATE 50 MCG/ACT NA SUSP: 2.0000 | Freq: Every day | NASAL | 6 refills | Status: DC

## 2017-06-25 MED ORDER — CIPROFLOXACIN HCL 500 MG PO TABS: 500.0000 mg | ORAL_TABLET | Freq: Two times a day (BID) | ORAL | 0 refills | Status: AC

## 2017-06-25 MED ORDER — MECLIZINE HCL 25 MG PO TABS
25.0000 mg | ORAL_TABLET | Freq: Three times a day (TID) | ORAL | 0 refills | Status: DC | PRN
Start: 2017-06-25 — End: 2018-07-11

## 2017-06-25 MED ORDER — CETIRIZINE HCL 10 MG PO TABS: 10.0000 mg | ORAL_TABLET | Freq: Every day | ORAL | 11 refills | Status: DC

## 2017-06-25 MED FILL — CIPROFLOXACIN HCL 500 MG TA: 500 | 3 days supply | Qty: 6 | Fill #0

## 2017-06-25 MED FILL — NAPROXEN 500 MG TABLET: 500 | 15 days supply | Qty: 30 | Fill #0

## 2017-06-25 MED FILL — TOPIRAMATE 25 MG TABLET: 25 | 30 days supply | Qty: 60 | Fill #0

## 2017-06-25 MED FILL — FLUTICASONE PROP 50 MCG SPR: 50 | 30 days supply | Qty: 16 | Fill #0

## 2017-06-25 MED FILL — ?CETIRIZINE HCL 10 MG TABLE: 10 | 30 days supply | Qty: 30 | Fill #0

## 2017-06-25 MED FILL — MECLIZINE 25 MG TABLET: 25 | 10 days supply | Qty: 30 | Fill #0
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
## 2017-06-25 NOTE — Patient Instructions (Addendum)
Conmocin cerebral - Adultos (Concussion, Adult) Una conmocin es una lesin cerebral. Las causas pueden ser:  Un golpe en la cabeza.  Un movimiento rpido y brusco (sacudida) de la cabeza o el cuello. Generalmente no pone en peligro la vida. Sin embargo, puede causar problemas graves. Si sufri una conmocin cerebral antes, puede tener sntomas similares a la conmocin despus de un golpe en la cabeza. CUIDADOS EN EL HOGAR Instrucciones generales  Siga cuidadosamente las indicaciones del mdico.  Tome los medicamentos como le indic el mdico.  Solo tome los medicamentos que su mdico considere seguros.  No beba alcohol hasta que el mdico lo autorice. El alcohol y algunos medicamentos pueden hacer ms lenta la curacin. Tambin pueden ponerlo en riesgo de sufrir otras lesiones.  Si tiene dificultad para recordar las cosas, escrbalas.  Trate de hacer una cosa por vez si se distrae con facilidad. Por ejemplo, no mire televisin mientras prepara la cena.  Consulte con familiares y amigos si debe tomar decisiones importantes.  Concurra a las consultas de control con el mdico, segn las indicaciones.  Observe sus sntomas. Dgale a los dems que hagan lo mismo. En algunos casos, despus de una conmocin cerebral surgen problemas graves. Es ms probable que Limited Brandsestos problemas graves los sufran los adultos [place].  Informe a los [address]est[address], el departamento de enfermera de la escuela, el consejero escolar, Public affairs consultantel entrenador o director acerca de su conmocin cerebral. Hgales saber lo que puede y lo que no Scientist, product/process developmentpuede hacer. Ellos debern observarlo para ver si: ? Tiene ms dificultad para prestar atencin o concentrarse. ? Le cuesta an ms recordar las cosas o aprender cosas nuevas. ? Necesita ms tiempo que lo habitual para finalizar las tareas. ? Est ms molesto (irritable) que antes. ? Tampoco puede Charity fundraisercontrolar el estrs. ? Tiene ms problemas que antes.  Haga reposo. Asegrese de  que: ? Duerme bien por la noche. ? Se va a dormir temprano. ? Acustese CarMaxtodos los das a la misma hora aproximadamente. Trate de despertarse a la misma hora. ? Descanse Administratordurante el da. ? Tome una siesta cuando se sienta cansado.  Limite las actividades que requieran mucha concentracin. Estas pueden ser: ? Hacer la tarea para Advice workerel hogar. ? Hacer tareas relacionadas con un trabajo. ? Mirar televisin. ? Usar la computadora. Regreso a las State Street Corporationactividades habituales Regrese a las actividades habituales gradualmente, no [place]qu[place] hacer todo de Building control surveyoruna vez. Debe darles al cuerpo y el cerebro su tiempo para recuperarse.  No practique deportes ni realice otras actividades deportivas hasta que el mdico lo autorice.  Consulte a su mdico cundo puede andar en bicicleta o conducir otros vehculos o mquinas. Nunca haga estas cosas si se siente mareado.  Pregunte a su mdico cundo podr volver a la escuela o al Aleen Campitrabajo. Prevencin de otra conmocin cerebral Es muy importante que evite otra lesin cerebral, especialmente antes de que se haya recuperado. En casos raros, una nueva lesin puede causar daos cerebrales permanentes, hinchazn del cerebro o la muerte. El riesgo es mayor durante los primeros 7 a 10das despus de una lesin. Evite las lesiones:  Use el cinturn de seguridad al conducir un automvil.  Evite beber alcohol en exceso.  Evite las actividades que puedan favorecer una segunda conmocin cerebral (como al practicar deportes de contacto).  Use un casco cuando practique actividades como: ? Andar en bicicleta. ? Esquiar. ? Andar en patineta. ? Andar en patines.  Haga de su hogar un lugar ms seguro: ? Retire las cosas del piso o  de las escaleras que podran hacerlo tropezar. ? Coloque barras en los baos y [employer] en las escaleras. ? Ponga alfombras antideslizantes en pisos y baeras. ? Mejore la iluminacin en zonas de penumbra. SOLICITE AYUDA SI:  Tiene ms dificultad para  prestar atencin o concentrarse.  Le cuesta an ms recordar las cosas o aprender cosas nuevas.  Necesita ms tiempo que lo habitual para finalizar las tareas.  Est ms molesto (irritable) que antes.  Tampoco puede Charity fundraiser.  Tiene ms problemas que antes.  Tiene problemas para mantener el equilibrio.  No logra reaccionar rpidamente cuando lo necesita. Solicite ayuda si tiene alguno de estos problemas durante ms de 2 semanas:  Dolor de cabeza que perdura (crnico).  Mareos o problemas de equilibrio.  Ganas de vomitar (nuseas).  Problemas para ver (de vista).  Sentirse afectado por ruidos o la luz ms que lo normal.  Sentir tristeza, decaimiento, sufrimiento espiritual, melancola, pesimismo o vaco (depresin).  Cambios de humor (cambios en el estado de nimo).  Sensacin de miedo o nerviosismo por lo que podra ocurrir (ansiedad).  Se siente abrumado.  Problemas de memoria.  Dificultad para concentrarse o Engineer, technical sales.  Problemas para dormir.  Cansancio permanente. SOLICITE AYUDA DE INMEDIATO SI:  Tiene dolores de cabeza intensos o estos empeoran.  Siente debilidad (aun si es solo en [place], una pierna o parte del rostro).  Tiene falta de sensibilidad (adormecimiento).  Siente que pierde el equilibrio.  No deja de vomitar.  Siente cansancio.  Uno de los centros negros del ojo (pupila) es ms grande que el otro.  Se retuerce o se sacude violentamente (tiene convulsiones).  El habla no es clara (arrastra las palabras).  Se siente ms confundido, se enoja con ms facilidad (agitacin) o est ms irritable que antes.  Tiene ms dificultad para descansar que antes.  No puede Nutritional therapist o lugares.  Siente dolor en el cuello.  Le resulta difcil despertarse.  Tiene cambios de conducta inusuales.  Se desmaya (pierde el conocimiento).  ASEGRESE DE QUE:  Comprende estas instrucciones.  Controlar su  afeccin.  Recibir ayuda de inmediato si no mejora o si empeora.  Esta informacin no tiene Theme park manager el consejo del mdico. Asegrese de hacerle al mdico cualquier pregunta que tenga. Document Released: 05/03/2010 Document Revised: 04/21/2014 Document Reviewed: 10/21/2012 Elsevier Interactive Patient Education  2017 Elsevier Inc.    Sleep Apnea Sleep apnea is a condition in which breathing pauses or becomes shallow during sleep. Episodes of sleep apnea usually last 10 seconds or longer, and they may occur as many as 20 times an hour. Sleep apnea disrupts your sleep and keeps your body from getting the rest that it needs. This condition can increase your risk of certain health problems, including:  Heart attack.  Stroke.  Obesity.  Diabetes.  Heart failure.  Irregular heartbeat.  There are three kinds of sleep apnea:  Obstructive sleep apnea. This kind is caused by a blocked or collapsed airway.  Central sleep apnea. This kind happens when the part of the brain that controls breathing does not send the correct signals to the muscles that control breathing.  Mixed sleep apnea. This is a combination of obstructive and central sleep apnea.  What are the causes? The most common cause of this condition is a collapsed or blocked airway. An airway can collapse or become blocked if:  Your throat muscles are abnormally relaxed.  Your tongue and tonsils are larger than normal.  You are  overweight.  Your airway is smaller than normal.  What increases the risk? This condition is more likely to develop in people who:  Are overweight.  Smoke.  Have a smaller than normal airway.  Are elderly.  Are female.  Drink alcohol.  Take sedatives or tranquilizers.  Have a family history of sleep apnea.  What are the signs or symptoms? Symptoms of this condition include:  Trouble staying asleep.  Daytime sleepiness and tiredness.  Irritability.  Loud  snoring.  Morning headaches.  Trouble concentrating.  Forgetfulness.  Decreased interest in sex.  Unexplained sleepiness.  Mood swings.  Personality changes.  Feelings of depression.  Waking up often during the night to urinate.  Dry mouth.  Sore throat.  How is this diagnosed? This condition may be diagnosed with:  A medical history.  A physical exam.  A series of tests that are done while you are sleeping (sleep study). These tests are usually done in a sleep lab, but they may also be done at home.  How is this treated? Treatment for this condition aims to restore normal breathing and to ease symptoms during sleep. It may involve managing health issues that can affect breathing, such as high blood pressure or obesity. Treatment may include:  Sleeping on your side.  Using a decongestant if you have nasal congestion.  Avoiding the use of depressants, including alcohol, sedatives, and narcotics.  Losing weight if you are overweight.  Making changes to your diet.  Quitting smoking.  Using a device to open your airway while you sleep, such as: ? An oral appliance. This is a custom-made mouthpiece that shifts your lower jaw forward. ? A continuous positive airway pressure (CPAP) device. This device delivers oxygen to your airway through a mask. ? A nasal expiratory positive airway pressure (EPAP) device. This device has valves that you put into each nostril. ? A bi-level positive airway pressure (BPAP) device. This device delivers oxygen to your airway through a mask.  Surgery if other treatments do not work. During surgery, excess tissue is removed to create a wider airway.  It is important to get treatment for sleep apnea. Without treatment, this condition can lead to:  High blood pressure.  Coronary artery disease.  (Men) An inability to achieve or maintain an erection (impotence).  Reduced thinking abilities.  Follow these instructions at  home:  Make any lifestyle changes that your health care provider recommends.  Eat a healthy, well-balanced diet.  Take over-the-counter and prescription medicines only as told by your health care provider.  Avoid using depressants, including alcohol, sedatives, and narcotics.  Take steps to lose weight if you are overweight.  If you were given a device to open your airway while you sleep, use it only as told by your health care provider.  Do not use any tobacco products, such as cigarettes, chewing tobacco, and e-cigarettes. If you need help quitting, ask your health care provider.  Keep all follow-up visits as told by your health care provider. This is important. Contact a health care provider if:  The device that you received to open your airway during sleep is uncomfortable or does not seem to be working.  Your symptoms do not improve.  Your symptoms get worse. Get help right away if:  You develop chest pain.  You develop shortness of breath.  You develop discomfort in your back, arms, or stomach.  You have trouble speaking.  You have weakness on one side of your body.  You have  drooping in your face. These symptoms may represent a serious problem that is an emergency. Do not wait to see if the symptoms will go away. Get medical help right away. Call your local emergency services (911 in the [place] Do not drive yourself to the hospital. This information is not intended to replace advice given to you by your health care provider. Make sure you discuss any questions you have with your health care provider. Document Released: 03/21/2002 Document Revised: 11/25/2015 Document Reviewed: 01/08/2015 Elsevier Interactive Patient Education  [employer].

## 2017-06-25 NOTE — Progress Notes (Signed)
Pt complains of headaches and dizziness

## 2017-06-26 ENCOUNTER — Encounter: Payer: Self-pay | Admitting: Neurology

## 2017-06-26 LAB — COMPREHENSIVE METABOLIC PANEL
A/G RATIO: 1.5 (ref 1.2–2.2)
ALT: 19 IU/L (ref 0–32)
AST: 19 IU/L (ref 0–40)
Albumin: 3.8 g/dL (ref 3.5–5.5)
Alkaline Phosphatase: 72 IU/L (ref 39–117)
BUN/Creatinine Ratio: 15 (ref 9–23)
BUN: 10 mg/dL (ref 6–20)
Bilirubin Total: 0.2 mg/dL (ref 0.0–1.2)
CALCIUM: 8.5 mg/dL — AB (ref 8.7–10.2)
CO2: 23 mmol/L (ref 20–29)
Chloride: 107 mmol/L — ABNORMAL HIGH (ref 96–106)
Creatinine, Ser: 0.65 mg/dL (ref 0.57–1.00)
GFR, EST AFRICAN AMERICAN: 135 mL/min/{1.73_m2} (ref >59)
GFR, EST NON AFRICAN AMERICAN: 117 mL/min/{1.73_m2} (ref >59)
GLOBULIN, TOTAL: 2.5 g/dL (ref 1.5–4.5)
Glucose: 75 mg/dL (ref 65–99)
POTASSIUM: 4 mmol/L (ref 3.5–5.2)
SODIUM: 145 mmol/L — AB (ref 134–144)
TOTAL PROTEIN: 6.3 g/dL (ref 6.0–8.5)

## 2017-06-26 LAB — CBC WITH DIFFERENTIAL/PLATELET
BASOS: 0 %
Basophils Absolute: 0 10*3/uL (ref 0.0–0.2)
EOS (ABSOLUTE): 0.1 10*3/uL (ref 0.0–0.4)
Eos: 2 %
HEMATOCRIT: 39.9 % (ref 34.0–46.6)
Hemoglobin: 13.1 g/dL (ref 11.1–15.9)
Immature Grans (Abs): 0 10*3/uL (ref 0.0–0.1)
Immature Granulocytes: 0 %
Lymphocytes Absolute: 1.5 10*3/uL (ref 0.7–3.1)
Lymphs: 17 %
MCH: 30.3 pg (ref 26.6–33.0)
MCHC: 32.8 g/dL (ref 31.5–35.7)
MCV: 92 fL (ref 79–97)
MONOS ABS: 0.5 10*3/uL (ref 0.1–0.9)
Monocytes: 6 %
NEUTROS ABS: 6.7 10*3/uL (ref 1.4–7.0)
Neutrophils: 75 %
PLATELETS: 298 10*3/uL (ref 150–379)
RBC: 4.33 x10E6/uL (ref 3.77–5.28)
RDW: 13.5 % (ref 12.3–15.4)
WBC: 8.9 10*3/uL (ref 3.4–10.8)

## 2017-06-26 LAB — TSH: TSH: 2.67 u[IU]/mL (ref 0.450–4.500)

## 2017-06-27 LAB — URINE CULTURE

## 2017-06-30 ENCOUNTER — Telehealth (INDEPENDENT_AMBULATORY_CARE_PROVIDER_SITE_OTHER): Payer: Self-pay

## 2017-06-30 NOTE — Telephone Encounter (Signed)
-----   Message from Loletta Specteroger David Gomez, PA-C sent at 06/29/2017  2:43 PM EDT ----- Urine cx only shows mixed urogenital flora at 10-25 K. No other medication is necessary. Rest of labs are normal.

## 2017-06-30 NOTE — Telephone Encounter (Signed)
[employer] interpreter [id number]) left patient message that urine culture only shows mixed urogenital flora no medication needed rest of labs normal. Call office with any questions. Maryjean Mornempestt S Jandel Patriarca, CMA

## 2017-07-22 ENCOUNTER — Ambulatory Visit (INDEPENDENT_AMBULATORY_CARE_PROVIDER_SITE_OTHER): Payer: Self-pay | Admitting: Physician Assistant

## 2017-07-24 ENCOUNTER — Encounter: Payer: Self-pay | Admitting: Neurology

## 2017-07-24 ENCOUNTER — Ambulatory Visit (INDEPENDENT_AMBULATORY_CARE_PROVIDER_SITE_OTHER): Payer: Self-pay | Admitting: Neurology

## 2017-07-24 VITALS — BP 110/80 | HR 70 | Ht 63.0 in | Wt 362.0 lb

## 2017-07-24 DIAGNOSIS — H8112 Benign paroxysmal vertigo, left ear: Secondary | ICD-10-CM

## 2017-07-24 DIAGNOSIS — G44219 Episodic tension-type headache, not intractable: Secondary | ICD-10-CM

## 2017-07-24 NOTE — Patient Instructions (Addendum)
1.  Follow the dizzy (vertigo) exercises 2.  Do not take meclizine too much.  Only when absolutely needed. 3.  Continue topiramate 25mg  twice daily 4.  Take naproxen for headache, limit to no more than 2 days out of week. 5.  Follow up in 2 to 3 months
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
## 2017-08-06 ENCOUNTER — Ambulatory Visit (INDEPENDENT_AMBULATORY_CARE_PROVIDER_SITE_OTHER): Payer: Self-pay | Admitting: Family Medicine

## 2017-08-06 DIAGNOSIS — L918 Other hypertrophic disorders of the skin: Secondary | ICD-10-CM

## 2017-08-06 NOTE — Assessment & Plan Note (Signed)
Typical skin tags.  No signs of cancer or infection.  Removed

## 2017-08-06 NOTE — Progress Notes (Signed)
Subjective  Stacie CruzSarai De La Kelly SplinterRosa Mitchell is a 32 y.o. female is presenting with the following  SKIN TAGS On her face around her eyes that get caught in her hair that pull and cause her pain. Also 3 on her left torso that are irritated and hurt when she rolls over.  No bleeding or redness or fever  Has had skin tags removed before. No history of skin ca or family history of cancer but several family members do have skin tags    Chief Complaint noted Review of Symptoms - see HPI PMH - Smoking status noted.    Objective Vital Signs reviewed BP 104/72   Pulse 66   Temp 98.4 F (36.9 C) (Oral)   Ht 5\' 3"  (1.6 m)   Wt (!) 366 lb 3.2 oz (166.1 kg)   SpO2 99%   BMI 64.87 kg/m  Two dark fleshy lesions on thin stalks around both eyes both 1mm at base Three lighter fleshy lesions on stalks on her left flank - 1mm, 2mm and 3mm at base  Procedure Consent signed Eyes protected with moist guaze Areas cleaned in sterile manner wit alcohol  Anesthesia - cold spray for facial and lidocaine 1% with epi for two flank lesions Lesions removed with scissors Minimal bleeding Silver nitrate used for hemostasis Patient tolerated well   Assessments/Plans  See after visit summary for details of patient instuctions  Skin tag Typical skin tags.  No signs of cancer or infection.  Removed

## 2017-08-06 NOTE — Patient Instructions (Signed)
Nice to meet you  We removed 5 skin tags.  The areas will be sore and slightly black for about 3-4 days  If you have an signs if infection - redness the is moving or discharge or fever let us know right away  Avoid sunlight to decrease the scar

## 2017-08-10 ENCOUNTER — Encounter: Payer: Self-pay | Admitting: Neurology

## 2017-08-11 ENCOUNTER — Encounter (INDEPENDENT_AMBULATORY_CARE_PROVIDER_SITE_OTHER): Payer: Self-pay | Admitting: Physician Assistant

## 2017-08-11 ENCOUNTER — Ambulatory Visit (INDEPENDENT_AMBULATORY_CARE_PROVIDER_SITE_OTHER): Payer: Self-pay | Admitting: Physician Assistant

## 2017-08-11 ENCOUNTER — Other Ambulatory Visit: Payer: Self-pay

## 2017-08-11 VITALS — BP 110/74 | HR 91 | Temp 98.5°F | Ht 63.0 in | Wt 361.4 lb

## 2017-08-11 DIAGNOSIS — L989 Disorder of the skin and subcutaneous tissue, unspecified: Secondary | ICD-10-CM

## 2017-08-11 DIAGNOSIS — S93491A Sprain of other ligament of right ankle, initial encounter: Secondary | ICD-10-CM

## 2017-08-11 DIAGNOSIS — G44219 Episodic tension-type headache, not intractable: Secondary | ICD-10-CM

## 2017-08-11 MED ORDER — CLINDAMYCIN PHOSPHATE 1 % EX GEL: Freq: Two times a day (BID) | CUTANEOUS | 0 refills | Status: DC

## 2017-08-11 MED ORDER — NAPROXEN 500 MG PO TABS: 500.0000 mg | ORAL_TABLET | Freq: Two times a day (BID) | ORAL | 0 refills | Status: DC

## 2017-08-11 MED ORDER — TOPIRAMATE 25 MG PO TABS: 25.0000 mg | ORAL_TABLET | Freq: Two times a day (BID) | ORAL | 5 refills | Status: DC

## 2017-08-11 MED FILL — TOPIRAMATE 25 MG TABS: 25 | 30 days supply | Qty: 60 | Fill #0

## 2017-08-11 MED FILL — NAPROXEN 500 MG TABLET: 500 | 15 days supply | Qty: 30 | Fill #0

## 2017-08-11 MED FILL — FLUTICASONE PROP 50 MCG SPR: 50 | 30 days supply | Qty: 16 | Fill #1

## 2017-08-11 MED FILL — ?CETIRIZINE HCL 10 MG TABLE: 10 | 30 days supply | Qty: 30 | Fill #1

## 2017-08-11 NOTE — Patient Instructions (Signed)
RHCE para los cuidados de rutina de las lesiones  (RICE for Routine Care of Injuries)  Muchas lesiones pueden tratarse con reposo, hielo, compresin y elevacin (RHCE). Un plan RHCE puede ayudar a aliviar el dolor y reducir la hinchazn, y, adems, a que el cuerpo se recupere.  Reposo  Reduzca las actividades que realiza normalmente y evite usar la zona lesionada del cuerpo. Puede reanudar las actividades normales cuando se sienta bien y el mdico lo autorice.  Hielo  No se aplique hielo directamente sobre la piel.   Ponga el hielo en una bolsa plstica.   Coloque una toalla entre la piel y la bolsa de hielo.   Coloque el hielo durante 20 minutos, 2 a 3 veces por da.  Hgalo durante el tiempo que el mdico se lo haya indicado.  Compresin  La compresin implica ejercer presin sobre la zona lesionada y se puede realizar con una venda elstica. Si se coloc una venda elstica:   Qutese y vuelva a colocarse la venda cada 3 o 4horas, o como se lo haya indicado el mdico.   Asegrese de que la venda no est muy ajustada. Afljela si una zona del cuerpo ms all de la venda se torna de color azul, est hinchada, se enfra o le causa dolor, o si pierde la sensibilidad en esa rea (adormecimiento).   Consulte al mdico si la venda parece empeorar los problemas.  Elevacin  La elevacin implica mantener elevada la zona lesionada. Eleve la zona lesionada por encima del nivel del corazn o del centro del pecho si puede hacerlo.  CUNDO PEDIR AYUDA?  Debe solicitar ayuda si:   El dolor y la hinchazn continan.   Los sntomas empeoran.  CUNDO DEBO OBTENER AYUDA DE INMEDIATO?  Debe obtener ayuda de inmediato en los siguientes casos:   Siente un dolor intenso repentino en la zona de la lesin o por debajo de esta.   Tiene irritacin o ms hinchazn alrededor de la lesin.   Tiene hormigueo o adormecimiento en la zona de la lesin o por debajo de esta que no desaparecen despus de quitarse la venda.  Esta  informacin no tiene como fin reemplazar el consejo del mdico. Asegrese de hacerle al mdico cualquier pregunta que tenga.  Document Released: 06/27/2008 Document Revised: 06/23/2011 Document Reviewed: 03/08/2014  Elsevier Interactive Patient Education  2017 Elsevier Inc[employer]
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
## 2017-08-12 ENCOUNTER — Other Ambulatory Visit (INDEPENDENT_AMBULATORY_CARE_PROVIDER_SITE_OTHER): Payer: Self-pay

## 2017-08-12 DIAGNOSIS — L989 Disorder of the skin and subcutaneous tissue, unspecified: Secondary | ICD-10-CM

## 2017-08-12 MED ORDER — CLINDAMYCIN PHOSPHATE 1 % EX GEL: Freq: Two times a day (BID) | CUTANEOUS | 0 refills | Status: DC

## 2017-08-12 MED FILL — CLINDAMYCIN PH 1% GEL: 1 | 30 days supply | Qty: 60 | Fill #0

## 2017-08-31 ENCOUNTER — Ambulatory Visit (HOSPITAL_COMMUNITY)
Admission: EM | Admit: 2017-08-31 | Discharge: 2017-08-31 | Disposition: A | Discharge status: Home / Self Care | Payer: Self-pay | Attending: Internal Medicine | Admitting: Internal Medicine

## 2017-08-31 ENCOUNTER — Encounter (HOSPITAL_COMMUNITY): Payer: Self-pay | Admitting: Emergency Medicine

## 2017-08-31 DIAGNOSIS — R030 Elevated blood-pressure reading, without diagnosis of hypertension: Secondary | ICD-10-CM

## 2017-08-31 DIAGNOSIS — M62838 Other muscle spasm: Secondary | ICD-10-CM

## 2017-08-31 LAB — POCT URINALYSIS DIP (DEVICE)
Bilirubin Urine: NEGATIVE
Glucose, UA: NEGATIVE mg/dL
HGB URINE DIPSTICK: NEGATIVE
LEUKOCYTES UA: NEGATIVE
Nitrite: NEGATIVE
PH: 5.5 (ref 5.0–8.0)
Protein, ur: NEGATIVE mg/dL
SPECIFIC GRAVITY, URINE: 1.025 (ref 1.005–1.030)
UROBILINOGEN UA: 0.2 mg/dL (ref 0.0–1.0)

## 2017-08-31 LAB — POCT PREGNANCY, URINE: Preg Test, Ur: NEGATIVE

## 2017-08-31 MED ORDER — NAPROXEN 500 MG PO TABS
500.0000 mg | ORAL_TABLET | Freq: Two times a day (BID) | ORAL | 0 refills | Status: DC
Start: 2017-08-31 — End: 2018-07-11

## 2017-08-31 NOTE — Discharge Instructions (Signed)
Urine was negative for infection.  We will send you urine out to culture and follow up with you regarding the results Drink plenty of water and avoid caffeine Continue conservative management of rest, ice, and gentle stretches Take naproxen as needed for pain relief (may cause abdominal discomfort, ulcers, and GI bleeds avoid taking with other NSAIDs) Follow up with PCP if symptoms persist Present to ER if worsening or new symptoms (fever, chills, chest pain, abdominal pain, changes in bowel or bladder habits, pain radiating into lower legs, etc...)   Blood pressure elevated in office.  Please recheck in 24 hours.  If it continues to be greater than 140/90 please follow up with PCP for further evaluation and management.     La orina fue negativa para la infeccin. Le enviaremos la orina al cultivo y Barrister's clerk un seguimiento con respecto a los [place]. Bebe mucha agua y evita la cafena. Continuar con el manejo conservador del descanso, el hielo y los estiramientos [place]. Tome naproxeno segn sea necesario para Engineer, materials (puede causar molestias abdominales, lceras y hemorragias gastrointestinales que deben evitarse con otros AINE) Seguimiento con PCP si los sntomas persisten. Presente a la sala de emergencias si los sntomas empeoran o si aparecen nuevos sntomas (fiebre, escalofros, dolor de pecho, dolor abdominal, cambios en los hbitos intestinales o vesicales, dolor en la parte inferior de las piernas, etc.)  Presin arterial elevada en el cargo. Por favor vuelva a revisar en 24 horas. Si contina siendo mayor a 140/90, haga un seguimiento con el PCP para una evaluacin y administracin adicionales.
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
## 2017-08-31 NOTE — ED Triage Notes (Signed)
PT reports left flank pain, burning with urination, and frequency. PT reports this is the third time recently.

## 2017-10-06 MED FILL — TOPIRAMATE 25 MG TABS: 25 | 30 days supply | Qty: 60 | Fill #1

## 2017-10-06 MED FILL — ?CETIRIZINE HCL 10 MG TABLE: 10 | 30 days supply | Qty: 30 | Fill #2

## 2017-10-06 MED FILL — FLUTICASONE PROP 50 MCG SPR: 50 | 30 days supply | Qty: 16 | Fill #2

## 2017-10-07 ENCOUNTER — Other Ambulatory Visit (INDEPENDENT_AMBULATORY_CARE_PROVIDER_SITE_OTHER): Payer: Self-pay | Admitting: Physician Assistant

## 2017-10-07 ENCOUNTER — Ambulatory Visit: Payer: Self-pay | Admitting: Neurology

## 2017-10-07 DIAGNOSIS — S93491A Sprain of other ligament of right ankle, initial encounter: Secondary | ICD-10-CM

## 2017-10-08 NOTE — Telephone Encounter (Signed)
FWD to PCP. Tempestt S Roberts, CMA  

## 2017-10-16 ENCOUNTER — Ambulatory Visit: Payer: Self-pay | Admitting: Neurology

## 2018-07-11 ENCOUNTER — Ambulatory Visit (HOSPITAL_COMMUNITY)
Admission: EM | Admit: 2018-07-11 | Discharge: 2018-07-11 | Disposition: A | Discharge status: Home / Self Care | Payer: Self-pay | Attending: Family Medicine | Admitting: Family Medicine

## 2018-07-11 DIAGNOSIS — B9789 Other viral agents as the cause of diseases classified elsewhere: Secondary | ICD-10-CM

## 2018-07-11 DIAGNOSIS — R42 Dizziness and giddiness: Secondary | ICD-10-CM

## 2018-07-11 DIAGNOSIS — I10 Essential (primary) hypertension: Secondary | ICD-10-CM

## 2018-07-11 DIAGNOSIS — S93491A Sprain of other ligament of right ankle, initial encounter: Secondary | ICD-10-CM

## 2018-07-11 DIAGNOSIS — G44209 Tension-type headache, unspecified, not intractable: Secondary | ICD-10-CM

## 2018-07-11 DIAGNOSIS — J322 Chronic ethmoidal sinusitis: Secondary | ICD-10-CM

## 2018-07-11 DIAGNOSIS — J069 Acute upper respiratory infection, unspecified: Secondary | ICD-10-CM

## 2018-07-11 DIAGNOSIS — L989 Disorder of the skin and subcutaneous tissue, unspecified: Secondary | ICD-10-CM

## 2018-07-11 MED ORDER — FLUTICASONE PROPIONATE 50 MCG/ACT NA SUSP: 2.0000 | Freq: Every day | NASAL | 6 refills | Status: DC

## 2018-07-11 MED ORDER — CETIRIZINE HCL 10 MG PO TABS: 10.0000 mg | ORAL_TABLET | Freq: Every day | ORAL | 11 refills | Status: DC

## 2018-07-11 MED ORDER — AMLODIPINE BESYLATE 5 MG PO TABS: 5.0000 mg | ORAL_TABLET | Freq: Every day | ORAL | 0 refills | Status: DC

## 2018-07-11 MED ORDER — BENZONATATE 200 MG PO CAPS: 200.0000 mg | ORAL_CAPSULE | Freq: Three times a day (TID) | ORAL | 0 refills | Status: DC | PRN

## 2018-07-11 MED ORDER — NAPROXEN 500 MG PO TABS: 500.0000 mg | ORAL_TABLET | Freq: Two times a day (BID) | ORAL | 0 refills | Status: DC

## 2018-07-11 MED ORDER — KETOROLAC TROMETHAMINE 30 MG/ML IJ SOLN
INTRAMUSCULAR | Status: AC
  Filled 2018-07-11: qty 1

## 2018-07-11 MED ORDER — KETOROLAC TROMETHAMINE 30 MG/ML IJ SOLN
30.0000 mg | Freq: Once | INTRAMUSCULAR | Status: AC
  Administered 2018-07-11: 30 mg via INTRAMUSCULAR

## 2018-07-11 MED ORDER — MECLIZINE HCL 25 MG PO TABS: 25.0000 mg | ORAL_TABLET | Freq: Three times a day (TID) | ORAL | 0 refills | Status: DC | PRN

## 2018-07-11 MED ORDER — BENZONATATE 200 MG PO CAPS: 200.0000 mg | ORAL_CAPSULE | Freq: Three times a day (TID) | ORAL | 0 refills | Status: AC | PRN

## 2018-07-11 NOTE — Discharge Instructions (Signed)
Dolor de [place] Dimos una inyeccion de Toradol hoy. [place] naprosyn 2 veces cada dia en su casa para dolor de [place] y cuerpo. Toma con comida.  Presion alta Empiece amlodipine diaria. [place] su presion. Ir a Neomia Dear doctora primaria para mas recetas de este medicina  Resfriado/alergias Empice zyrtec y flonase diara para ayuda con congestion y dolor de gargant/muco en garganta Tessalon cada 8 horas si necesita para tos Quedarse en su casa hasta su sintomas mejoran Regrese si sus sintomas no mejoran o son mas peor  Si tiene mas cambios en vision, dolor de pecho, falta de aire, dolor de cabeza mas peor, debilidad, dificuldad de hablar ir a la sala de emergencia

## 2018-07-11 NOTE — ED Triage Notes (Signed)
Pt cc headache, fever, sore throat, bodyaches  and cough. Pt has tried tylenol. This has been going on 4 days.
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
## 2019-01-24 ENCOUNTER — Encounter (INDEPENDENT_AMBULATORY_CARE_PROVIDER_SITE_OTHER): Payer: Self-pay | Admitting: Primary Care

## 2019-01-24 ENCOUNTER — Other Ambulatory Visit: Payer: Self-pay

## 2019-01-24 ENCOUNTER — Ambulatory Visit (INDEPENDENT_AMBULATORY_CARE_PROVIDER_SITE_OTHER): Payer: Self-pay | Admitting: Primary Care

## 2019-01-24 VITALS — BP 160/85 | HR 98 | Temp 97.3°F | Ht 63.0 in | Wt 375.2 lb

## 2019-01-24 DIAGNOSIS — J322 Chronic ethmoidal sinusitis: Secondary | ICD-10-CM

## 2019-01-24 DIAGNOSIS — Z23 Encounter for immunization: Secondary | ICD-10-CM

## 2019-01-24 DIAGNOSIS — G43109 Migraine with aura, not intractable, without status migrainosus: Secondary | ICD-10-CM

## 2019-01-24 DIAGNOSIS — W19XXXA Unspecified fall, initial encounter: Secondary | ICD-10-CM

## 2019-01-24 DIAGNOSIS — R42 Dizziness and giddiness: Secondary | ICD-10-CM

## 2019-01-24 DIAGNOSIS — W1809XA Striking against other object with subsequent fall, initial encounter: Secondary | ICD-10-CM

## 2019-01-24 DIAGNOSIS — N912 Amenorrhea, unspecified: Secondary | ICD-10-CM

## 2019-01-24 DIAGNOSIS — M25511 Pain in right shoulder: Secondary | ICD-10-CM

## 2019-01-24 DIAGNOSIS — I10 Essential (primary) hypertension: Secondary | ICD-10-CM

## 2019-01-24 DIAGNOSIS — Z6841 Body Mass Index (BMI) 40.0 and over, adult: Secondary | ICD-10-CM

## 2019-01-24 LAB — POCT URINE PREGNANCY: Preg Test, Ur: NEGATIVE

## 2019-01-24 MED ORDER — TOPIRAMATE 25 MG PO TABS: 25.0000 mg | ORAL_TABLET | Freq: Two times a day (BID) | ORAL | 3 refills | Status: DC

## 2019-01-24 MED ORDER — AMLODIPINE BESYLATE 10 MG PO TABS: 10.0000 mg | ORAL_TABLET | Freq: Every day | ORAL | 3 refills | Status: DC

## 2019-01-24 MED ORDER — CYCLOBENZAPRINE HCL 10 MG PO TABS: 10.0000 mg | ORAL_TABLET | Freq: Three times a day (TID) | ORAL | 1 refills | Status: DC | PRN

## 2019-01-24 MED ORDER — CETIRIZINE HCL 10 MG PO TABS: 10.0000 mg | ORAL_TABLET | Freq: Every day | ORAL | 11 refills | Status: DC

## 2019-01-24 MED ORDER — MECLIZINE HCL 25 MG PO TABS: 25.0000 mg | ORAL_TABLET | Freq: Three times a day (TID) | ORAL | 0 refills | Status: DC | PRN

## 2019-01-24 MED ORDER — FLUTICASONE PROPIONATE 50 MCG/ACT NA SUSP: 2.0000 | Freq: Every day | NASAL | 1 refills | Status: DC

## 2019-01-24 MED ORDER — IBUPROFEN 600 MG PO TABS: 600.0000 mg | ORAL_TABLET | Freq: Three times a day (TID) | ORAL | 0 refills | Status: DC | PRN

## 2019-01-24 MED FILL — IBUPROFEN 600 MG TABLET: 600 | 30 days supply | Qty: 90 | Fill #0

## 2019-01-24 MED FILL — AMLODIPINE BESYLATE 10 MG T: 10 | 30 days supply | Qty: 30 | Fill #0

## 2019-01-24 MED FILL — CYCLOBENZAPRINE 10 MG TAB: 10 | 20 days supply | Qty: 60 | Fill #0
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
## 2019-01-24 NOTE — Patient Instructions (Signed)
Hipertensin en los adultos Hypertension, Adult El trmino hipertensin es otra forma de denominar a la presin arterial elevada. La presin arterial elevada fuerza al corazn a trabajar ms para bombear la sangre. Esto puede causar problemas con el paso del tiempo. Una lectura de presin arterial est compuesta por 2 nmeros. Hay un nmero superior (sistlico) sobre un nmero inferior (diastlico). Lo ideal es tener la presin arterial por debajo de 120/80. Las elecciones saludables pueden ayudar a bajar la presin arterial, o tal vez necesite medicamentos para bajarla. Cules son las causas? Se desconoce la causa de esta afeccin. Algunas afecciones pueden estar relacionadas con la presin arterial alta. Qu incrementa el riesgo?  Fumar.  Tener diabetes mellitus tipo 2, colesterol alto, o ambos.  No hacer la cantidad suficiente de actividad fsica o ejercicio.  Tener sobrepeso.  Consumir mucha grasa, azcar, caloras o sal (sodio) en su dieta.  Beber alcohol en exceso.  Tener una enfermedad renal a largo plazo (crnica).  Tener antecedentes familiares de presin arterial alta.  Edad. Los riesgos aumentan con la edad.  Raza. El riesgo es mayor para las personas afroamericanas.  Sexo. Antes de los 45aos, los hombres corren ms riesgo que las mujeres. Despus de los 65aos, las mujeres corren ms riesgo que los hombres.  Tener apnea obstructiva del sueo.  Estrs. Cules son los signos o los sntomas?  Es posible que la presin arterial alta puede no cause sntomas. La presin arterial muy alta (crisis hipertensiva) puede provocar: ? Dolor de cabeza. ? Sensaciones de preocupacin o nerviosismo (ansiedad). ? Falta de aire. ? Hemorragia nasal. ? Sensacin de malestar en el estmago (nuseas). ? Vmitos. ? Cambios en la forma de ver. ? Dolor muy intenso en el pecho. ? Convulsiones. Cmo se trata?  Esta afeccin se trata haciendo cambios saludables en el estilo de  vida, por ejemplo: ? Consumir alimentos saludables. ? Hacer ms ejercicio. ? Beber menos alcohol.  El mdico puede recetarle medicamentos si los cambios en el estilo de vida no son suficientes para lograr controlar la presin arterial y si: ? El nmero de arriba est por encima de 130. ? El nmero de abajo est por encima de 80.  Su presin arterial personal ideal puede variar. Siga estas instrucciones en su casa: Comida y bebida   Si se lo dicen, siga el plan de alimentacin de DASH (Dietary Approaches to Stop Hypertension, Maneras de alimentarse para detener la hipertensin). Para seguir este plan: ? Llene la mitad del plato de cada comida con frutas y verduras. ? Llene un cuarto del plato de cada comida con cereales integrales. Los cereales integrales incluyen pasta integral, arroz integral y pan integral. ? Coma y beba productos lcteos con bajo contenido de grasa, como leche descremada o yogur bajo en grasas. ? Llene un cuarto del plato de cada comida con protenas bajas en grasa (magras). Las protenas bajas en grasa incluyen pescado, pollo sin piel, huevos, frijoles y tofu. ? Evite consumir carne grasa, carne curada y procesada, o pollo con piel. ? Evite consumir alimentos prehechos o procesados.  Consuma menos de 1500 mg de sal por da.  No beba alcohol si: ? El mdico le indica que no lo haga. ? Est embarazada, puede estar embarazada o est tratando de quedar embarazada.  Si bebe alcohol: ? Limite la cantidad que bebe a lo siguiente:  De 0 a 1 medida por da para las mujeres.  De 0 a 2 medidas por da para los hombres. ? Est atento a   la cantidad de alcohol que hay en las bebidas que toma. En [place], una medida equivale a una botella de cerveza de 12oz (355ml), un vaso de vino de 5oz (148ml) o un vaso de una bebida alcohlica de alta graduacin de 1oz (44ml). Estilo de vida   Trabaje con su mdico para mantenerse en un peso saludable o para perder  peso. Pregntele a su mdico cul es el peso recomendable para usted.  Haga al menos 30minutos de ejercicio la mayora de los das de la semana. Estos pueden incluir caminar, nadar o andar en bicicleta.  Realice al menos 30 minutos de ejercicio que fortalezca sus msculos (ejercicios de resistencia) al menos 3 das a la semana. Estos pueden incluir levantar pesas o hacer Pilates.  No consuma ningn producto que contenga nicotina o tabaco, como cigarrillos, cigarrillos electrnicos y tabaco de mascar. Si necesita ayuda para dejar de fumar, consulte al mdico.  Controle su presin arterial en su casa tal como le indic el mdico.  Concurra a todas las visitas de seguimiento como se lo haya indicado el mdico. Esto es importante. Medicamentos  Tome los medicamentos de venta libre y los recetados solamente como se lo haya indicado el mdico. Siga cuidadosamente las indicaciones.  No omita las dosis de medicamentos para la presin arterial. Los medicamentos pierden eficacia si omite dosis. El hecho de omitir las dosis tambin aumenta el riesgo de otros problemas.  Pregntele a su mdico a qu efectos secundarios o reacciones a los medicamentos debe prestar atencin. Comunquese con un mdico si:  Piensa que tiene una reaccin a los medicamentos que est tomando.  Tiene dolores de cabeza frecuentes (recurrentes).  Se siente mareado.  Tiene hinchazn en los tobillos.  Tiene problemas de visin. Solicite ayuda inmediatamente si:  Siente un dolor de cabeza muy intenso.  Empieza a sentirse desorientado (confundido).  Se siente dbil o adormecido.  Siente que va a desmayarse.  Tiene un dolor muy intenso en las siguientes zonas: ? Pecho. ? Vientre (abdomen).  Vomita ms de una vez.  Tiene dificultad para respirar. Resumen  El trmino hipertensin es otra forma de denominar a la presin arterial elevada.  La presin arterial elevada fuerza al corazn a trabajar ms para bombear  la sangre.  Para la mayora de las personas, una presin arterial normal es menor que 120/80.  Las decisiones saludables pueden ayudarle a disminuir su presin arterial. Si no puede bajar su presin arterial mediante decisiones saludables, es posible que deba tomar medicamentos. Esta informacin no tiene como fin reemplazar el consejo del mdico. Asegrese de hacerle al mdico cualquier pregunta que tenga. Document Released: 09/18/2009 Document Revised: 01/14/2018 Document Reviewed: 01/14/2018 Elsevier Patient Education  2020 [employer]  

## 2019-01-24 NOTE — Progress Notes (Signed)
Pt complains of limited range of motion in right arm due to fall at work

## 2019-02-03 ENCOUNTER — Ambulatory Visit: Payer: Self-pay

## 2019-03-21 ENCOUNTER — Ambulatory Visit (INDEPENDENT_AMBULATORY_CARE_PROVIDER_SITE_OTHER): Payer: Self-pay | Admitting: Primary Care

## 2019-04-16 ENCOUNTER — Other Ambulatory Visit (INDEPENDENT_AMBULATORY_CARE_PROVIDER_SITE_OTHER): Payer: Self-pay | Admitting: Primary Care

## 2019-04-16 DIAGNOSIS — J322 Chronic ethmoidal sinusitis: Secondary | ICD-10-CM

## 2019-04-16 DIAGNOSIS — W19XXXA Unspecified fall, initial encounter: Secondary | ICD-10-CM

## 2019-04-18 NOTE — Telephone Encounter (Signed)
FWD to PCP

## 2019-06-29 ENCOUNTER — Other Ambulatory Visit (INDEPENDENT_AMBULATORY_CARE_PROVIDER_SITE_OTHER): Payer: Self-pay | Admitting: Primary Care

## 2019-06-29 DIAGNOSIS — I10 Essential (primary) hypertension: Secondary | ICD-10-CM

## 2019-07-21 ENCOUNTER — Ambulatory Visit: Payer: Self-pay | Attending: Nurse Practitioner | Admitting: Physician Assistant

## 2019-07-21 ENCOUNTER — Other Ambulatory Visit: Payer: Self-pay

## 2019-07-21 VITALS — BP 133/83 | HR 93 | Temp 97.9°F | Ht 63.0 in | Wt 366.4 lb

## 2019-07-21 DIAGNOSIS — Z13220 Encounter for screening for lipoid disorders: Secondary | ICD-10-CM

## 2019-07-21 DIAGNOSIS — G43109 Migraine with aura, not intractable, without status migrainosus: Secondary | ICD-10-CM

## 2019-07-21 DIAGNOSIS — R7303 Prediabetes: Secondary | ICD-10-CM

## 2019-07-21 DIAGNOSIS — I10 Essential (primary) hypertension: Secondary | ICD-10-CM

## 2019-07-21 DIAGNOSIS — J302 Other seasonal allergic rhinitis: Secondary | ICD-10-CM

## 2019-07-21 DIAGNOSIS — L2089 Other atopic dermatitis: Secondary | ICD-10-CM

## 2019-07-21 DIAGNOSIS — G44229 Chronic tension-type headache, not intractable: Secondary | ICD-10-CM

## 2019-07-21 MED ORDER — FLUTICASONE PROPIONATE 50 MCG/ACT NA SUSP: NASAL | 6 refills | Status: DC

## 2019-07-21 MED ORDER — HYDROXYZINE HCL 25 MG PO TABS: 25.0000 mg | ORAL_TABLET | Freq: Every evening | ORAL | 0 refills | Status: DC | PRN

## 2019-07-21 MED ORDER — TOPIRAMATE 25 MG PO TABS: 25.0000 mg | ORAL_TABLET | Freq: Two times a day (BID) | ORAL | 3 refills | Status: DC

## 2019-07-21 MED ORDER — TRIAMCINOLONE ACETONIDE 0.1 % EX CREA: 1.0000 "application " | TOPICAL_CREAM | Freq: Two times a day (BID) | CUTANEOUS | 0 refills | Status: DC

## 2019-07-21 MED ORDER — AMLODIPINE BESYLATE 10 MG PO TABS: 10.0000 mg | ORAL_TABLET | Freq: Every day | ORAL | 3 refills | Status: DC

## 2019-07-21 MED ORDER — CETIRIZINE HCL 10 MG PO TABS: 10.0000 mg | ORAL_TABLET | Freq: Every day | ORAL | 11 refills | Status: DC

## 2019-07-21 MED FILL — TOPIRAMATE 25 MG TABS: 25 | 30 days supply | Qty: 60 | Fill #0

## 2019-07-21 MED FILL — hydrOXYzine HCL 25 MG TABS: 25 | 30 days supply | Qty: 30 | Fill #0

## 2019-07-21 MED FILL — AMLODIPINE BESYLATE 10 MG T: 10 | 30 days supply | Qty: 30 | Fill #0

## 2019-07-21 MED FILL — TRIAMCINOLONE ACETONIDE 0.1: 0.1 | 15 days supply | Qty: 30 | Fill #0

## 2019-07-21 MED FILL — FLUTICASONE PROP 50 MCG SPR: 50 | 30 days supply | Qty: 16 | Fill #0

## 2019-07-21 NOTE — Patient Instructions (Signed)
Dermatitis atpica Atopic Dermatitis La dermatitis atpica es un trastorno de la piel que causa inflamacin. Es el tipo ms frecuente de eczema. El eczema es un grupo de afecciones de la piel que causan picazn, enrojecimiento e hinchazn. Esta afeccin, generalmente, empeora durante los meses fros del invierno y suele mejorar durante los meses clidos del verano. Los sntomas pueden variar de Neomia Dear persona a Educational psychologist. La dermatitis atpica, normalmente, comienza a manifestarse en la infancia y puede durar hasta la [place]. Esta afeccin no puede transmitirse de [place] persona a otra (no es contagiosa), pero es ms comn en las familias. Es posible que la dermatitis atpica no siempre sea visible. Cuando es visible, se habla de un brote. Cules son las causas? Se desconoce la causa exacta de esta afeccin. Algunos factores desencadenantes de los brotes pueden ser los siguientes:  Contacto con [place] cosa a la que es sensible o Best boy.  Librarian, academic.  Ciertos alimentos.  Clima extremadamente clido o fro.  Jabones y sustancias qumicas fuertes.  Aire seco.  Cloro. Qu incrementa el riesgo? Esta afeccin es ms probable que [place] en personas que tienen antecedentes personales o familiares de eczema, alergias, asma o fiebre del heno. Cules son los signos o los sntomas? Los sntomas de esta afeccin [employer] siguientes:  Piel seca y escamosa.  Erupcin roja y que pica.  Picazn, que puede ser muy intensa. Puede ocurrir antes de la erupcin en la piel. Esto puede dificultar el sueo.  Engrosamiento y Programmer, systems de la piel que pueden producirse con Museum/gallery conservator. Cmo se diagnostica? Esta afeccin se diagnostica en funcin de los sntomas, los antecedentes mdicos y un examen fsico. Cmo se trata? No hay cura para esta afeccin, pero los sntomas, normalmente, se pueden controlar. El tratamiento se centra en lo siguiente:  Controlar la picazn y el rascado. Probablemente, le receten  medicamentos, como antihistamnicos o cremas corticoesteroides.  Limitar la exposicin a las cosas a las que es sensible o Best boy (alrgenos).  Reconocer situaciones que causan estrs e idear un plan para controlarlo. Si la dermatitis atpica no mejora con medicamentos o si est presente en todo el cuerpo (diseminada), puede utilizarse un tratamiento con un tipo de luz especfico (fototerapia). Siga estas indicaciones en su casa: Cuidado de la piel   Mantenga la piel bien humectada. Al hacerlo, quedar hmeda y ayudar a prevenir la sequedad. ? Utilice lociones sin perfume que contengan vaselina. ? Evite las lociones que contienen alcohol o agua. Pueden secar la piel.  Tome baos o duchas de corta duracin (menos de 5 minutos) en agua tibia. No use agua caliente. ? Use jabones suaves y sin perfume para baarse. Evite el jabn y el bao de espuma. ? Aplique un humectante para la piel inmediatamente despus de un bao o una ducha.  No aplique nada sobre la piel sin Science writer a su mdico. Instrucciones generales  Vstase con ropa de algodn o mezcla de algodn. Vstase con ropas ligeras, ya que el calor aumenta la picazn.  Cuando lave la ropa, [address] [address] para eliminar todo el [place].  Evite cualquier factor desencadenante que pueda causar un brote.  Intente manejar el estrs.  Mantenga las uas cortas.  Evite rascarse. El rascado hace que la erupcin y la picazn empeoren. Tambin puede producir una infeccin en la piel (imptigo) debido a las lesiones cutneas causadas por el rascado.  Tome o aplquese los medicamentos de venta libre y Building control surveyor como se lo haya indicado el mdico.  Oceanographer a todas las  visitas de seguimiento como se lo haya indicado el mdico. Esto es importante.  No est cerca de personas que tengan herpes labial o ampollas febriles. Si se produce la infeccin, puede hacer que la dermatitis atpica empeore. Comunquese con un mdico  si:  La picazn le impide dormir.  La erupcin empeora o no mejora en el plazo de una semana despus de iniciar el tratamiento.  Tiene fiebre.  Aparece un brote despus de estar en contacto con alguien que tiene herpes labial o ampollas febriles. Solicite ayuda de inmediato si:  Tiene pus o costras amarillas en la zona de la erupcin. Resumen  Esta afeccin causa una erupcin roja que pica, y la piel est seca y escamosa.  El tratamiento se enfoca en controlar la picazn y el rascado, limitar la exposicin a cosas a las que es sensible o Best boy (alrgenos), reconocer situaciones que causan estrs e idear un plan para Dealer.  Mantenga la piel bien humectada.  Tome baos o duchas de menos de 5 minutos y use agua tibia. No use agua caliente. Esta informacin no tiene Theme park manager el consejo del mdico. Asegrese de hacerle al mdico cualquier pregunta que tenga. Document Revised: 07/21/2016 Document Reviewed: 07/21/2016 Elsevier Patient Education  2020 [employer]. Plan de alimentacin para personas con prediabetes Prediabetes Eating Plan La prediabetes es una afeccin que hace que los niveles de azcar en la sangre (glucosa) sean ms altos de lo normal. Esto aumenta el riesgo de tener diabetes. Para prevenir la diabetes, es posible que su mdico le recomiende cambios en la dieta y otros cambios en su estilo de vida que lo ayuden a Personnel officer lo siguiente:  Chief Operating Officer los niveles de glucemia.  Mejorar los niveles de [place].  Controlar la presin arterial. El mdico puede recomendarle que trabaje con un especialista en alimentacin y nutricin (nutricionista) para Materials engineer de comidas ms conveniente para usted. Consejos para seguir este plan: Estilo de vida  Establezca metas para bajar de peso con la ayuda de su equipo de atencin mdica. A la [employer] con prediabetes se les recomienda bajar un 7% de su peso corporal.  Haga ejercicio al  menos 5das por semana, como mnimo.  Asista a un grupo de apoyo o solicite el apoyo continuo de un consejero de salud mental.  [employer] medicamentos de venta libre y los recetados solamente como se lo haya indicado el mdico. Leer las etiquetas de los alimentos  Lea las etiquetas de los alimentos envasados para controlar la cantidad de grasa, sal (sodio) y azcar que contienen. Evite los alimentos que contengan lo siguiente: ? Grasas saturadas. ? Grasas trans. ? Azcares agregados.  Evite los alimentos que contengan ms de 370miligramos(mg) de sodio por porcin. Limite el consumo diario de sodio a menos de 2300mg  por . De compras  Evite comprar alimentos procesados y preelaborados. Coccin  Cocine con aceite de oliva. No use mantequilla, manteca de cerdo o Futures trader.  Cocine los alimentos al horno, a la parrilla, asados o hervidos. Evite frerlos. Planificacin de las comidas   Trabaje con el nutricionista para crear un plan de alimentacin que sea adecuado para usted. Esto puede incluir lo siguiente: ? Registro de la cantidad de caloras que ingiere. Use un registro de alimentos, un cuaderno o una aplicacin mvil para anotar lo que comi en cada comida. ? Uso del ndice glucmico (IG) para planificar las comidas. El ndice [employer] con qu rapidez elevar la glucemia un alimento. Elija  alimentos con bajo IG. Estos demoran ms en elevar la glucemia.  Considere la posibilidad de seguir Web designer. Esta dieta incluye lo siguiente: ? Varias porciones de frutas y verduras frescas por da. ? Pescado al [employer] veces por semana. ? Varias porciones de cereales integrales, frijoles, frutos secos y semillas por da. ? Aceite de Tour manager de otras grasas. ? Consumo moderado de alcohol. ? Pequeas cantidades de carnes rojas y lcteos enteros.  Si tiene hipertensin arterial, quizs Development worker, international aid consumo de sodio o seguir una dieta como el  plan de alimentacin basado en Enfoques Alimentarios para Detener la Hipertensin (Dietary Approaches to Stop Hypertension, DASH). Este es un plan de alimentacin cuyo objetivo es bajar la hipertensin arterial. Qu alimentos se recomiendan? Es posible que los alimentos incluidos a continuacin no Systems developer. Hable con el nutricionista sobre las mejores opciones alimenticias para usted. Cereales Productos integrales, como panes, galletas, cereales y pastas de salvado o integrales. Avena sin azcar. Trigo burgol. Cebada. Quinua. Arroz integral. Tacos o tortillas de harina de maz o de salvado. [place] Valeda Malm. [place]spinaca. Guisantes. Remolachas. Coliflor. Repollo. Brcoli. Zanahorias. Tomates. Cal[place]aza. Augustin Coupe. Hierbas. Pimienta. Cebollas. Pepinos. Repollitos de Bruselas. Frutas Frutos rojos. Bananas. Manzanas. Naranjas. Uvas. Papaya. Mango. [place]. Kiwi. Pomelo. Cerezas. Carnes y otros alimentos ricos en protenas Mariscos. Carne de ave sin piel. Cortes magros de cerdo y carne de res. Tofu. Huevos. Frutos secos. Frijoles. Lcteos Productos lcteos descremados o semidescremados, como yogur, queso cottage y [place]. [employer]. T. Caf. Gaseosas sin azcar o dietticas. Soda. Leche descremada o semidescremada. Productos alternativos a la [place], como [place] de soja o de [place]. Grasas y aceites Aceite de [place]. Aceite de canola. Aceite de girasol. Aceite de semillas de uva. Agu[place]. Nueces. Dulces y postres Pudin sin azcar o con bajo contenido de [place]. Helado y otros postres congelados sin azcar o con bajo contenido de [place]. Condimentos y otros alimentos Hierbas. Especias sin sodio. Mostaza. Salsa de pepinillos. [place] con bajo contenido de [place] y de Location manager. Salsa barbacoa con bajo contenido de grasa y de azcar. Mayonesa con bajo contenido de grasa o sin grasa. Qu alimentos no se recomiendan? Es posible que los alimentos incluidos a continuacin no Geologist, engineering. Hable con el nutricionista sobre las mejores opciones alimenticias para usted. Cereales Productos elaborados con [place] y [place], como panes, pastas, bocadillos y cereales. Verduras Verduras enlatadas. Verduras congeladas con mantequilla o salsa de crema. Lambert Mody Frutas enlatadas al almbar. Carnes y otros alimentos ricos en protenas Cortes de carne con grasa. Carne de ave con piel. Carne empanizada o frita. Carnes procesadas. Lcteos Yogur, [place] enteros. Bebidas Bebidas azucaradas, como t helado dulce y [place]. Grasas y [employer]. [place]. Mantequilla clarificada. Dulces y [employer], como pasteles, pastelitos, galletas dulces y tarta de [place]. Condimentos y otros alimentos Mezclas de especias con sal agregada. [place]. Salsa barbacoa. Mayonesa. Resumen  Para prevenir la diabetes, es posible que Astronomer en la dieta y otros cambios en su estilo de vida para ayudar a Diplomatic Services operational officer en la sangre, mejorar los niveles de colesterol y Aeronautical engineer presin arterial.  Establezca metas para bajar de peso con la ayuda de su equipo de atencin mdica. A la [employer] con prediabetes se les recomienda bajar un 7por ciento de su peso corporal.  Considere la posibilidad de seguir una dieta mediterrnea que incluya muchas frutas y verduras frescas, cereales integrales, frijoles,  frutos secos, semillas, pescado, carnes magras, lcteos descremados y aceites saludables. Esta informacin no tiene Theme park manager el consejo del mdico. Asegrese de hacerle al mdico cualquier pregunta que tenga. Document Revised: 10/13/2016 Document Reviewed: 10/13/2016 Elsevier Patient Education  2020 [employer].
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
## 2019-07-21 NOTE — Progress Notes (Signed)
Med refill allergy meds and BP med  Sore on rt lower leg  Check to see if she is a diabetic

## 2019-07-22 LAB — COMP. METABOLIC PANEL (12)
AST: 22 IU/L (ref 0–40)
Albumin/Globulin Ratio: 1.9 (ref 1.2–2.2)
Albumin: 4.4 g/dL (ref 3.8–4.8)
Alkaline Phosphatase: 68 IU/L (ref 39–117)
BUN/Creatinine Ratio: 13 (ref 9–23)
BUN: 9 mg/dL (ref 6–20)
Bilirubin Total: <0.2 mg/dL (ref 0.0–1.2)
Calcium: 9.2 mg/dL (ref 8.7–10.2)
Chloride: 104 mmol/L (ref 96–106)
Creatinine, Ser: 0.68 mg/dL (ref 0.57–1.00)
GFR calc Af Amer: 131 mL/min/{1.73_m2} (ref >59)
GFR calc non Af Amer: 114 mL/min/{1.73_m2} (ref >59)
Globulin, Total: 2.3 g/dL (ref 1.5–4.5)
Glucose: 90 mg/dL (ref 65–99)
Potassium: 4.2 mmol/L (ref 3.5–5.2)
Sodium: 140 mmol/L (ref 134–144)
Total Protein: 6.7 g/dL (ref 6.0–8.5)

## 2019-07-22 LAB — CBC WITH DIFFERENTIAL/PLATELET
Basophils Absolute: 0 10*3/uL (ref 0.0–0.2)
Basos: 1 %
EOS (ABSOLUTE): 0.1 10*3/uL (ref 0.0–0.4)
Eos: 2 %
Hematocrit: 38.9 % (ref 34.0–46.6)
Hemoglobin: 13.2 g/dL (ref 11.1–15.9)
Immature Grans (Abs): 0 10*3/uL (ref 0.0–0.1)
Immature Granulocytes: 0 %
Lymphocytes Absolute: 2 10*3/uL (ref 0.7–3.1)
Lymphs: 26 %
MCH: 31.1 pg (ref 26.6–33.0)
MCHC: 33.9 g/dL (ref 31.5–35.7)
MCV: 92 fL (ref 79–97)
Monocytes Absolute: 0.4 10*3/uL (ref 0.1–0.9)
Monocytes: 6 %
Neutrophils Absolute: 5.1 10*3/uL (ref 1.4–7.0)
Neutrophils: 65 %
Platelets: 284 10*3/uL (ref 150–450)
RBC: 4.25 x10E6/uL (ref 3.77–5.28)
RDW: 12.7 % (ref 11.7–15.4)
WBC: 7.7 10*3/uL (ref 3.4–10.8)

## 2019-07-22 LAB — LIPID PANEL
Chol/HDL Ratio: 4.5 ratio — ABNORMAL HIGH (ref 0.0–4.4)
Cholesterol, Total: 171 mg/dL (ref 100–199)
HDL: 38 mg/dL — ABNORMAL LOW (ref >39)
LDL Chol Calc (NIH): 109 mg/dL — ABNORMAL HIGH (ref 0–99)
Triglycerides: 134 mg/dL (ref 0–149)
VLDL Cholesterol Cal: 24 mg/dL (ref 5–40)

## 2019-07-22 LAB — TSH: TSH: 1.05 u[IU]/mL (ref 0.450–4.500)

## 2019-07-27 ENCOUNTER — Other Ambulatory Visit: Payer: Self-pay | Admitting: Physician Assistant

## 2019-07-27 DIAGNOSIS — R7303 Prediabetes: Secondary | ICD-10-CM

## 2019-07-27 LAB — HEMOGLOBIN A1C
Est. average glucose Bld gHb Est-mCnc: 120 mg/dL
Hgb A1c MFr Bld: 5.8 % — ABNORMAL HIGH (ref 4.8–5.6)

## 2019-07-27 LAB — SPECIMEN STATUS REPORT

## 2019-07-27 MED ORDER — METFORMIN HCL 500 MG PO TABS: 500.0000 mg | ORAL_TABLET | Freq: Every day | ORAL | 0 refills | Status: DC

## 2019-07-27 MED FILL — metFORMIN HCL 500 MG TABS: 500 | 30 days supply | Qty: 30 | Fill #0

## 2019-07-28 ENCOUNTER — Telehealth: Payer: Self-pay | Admitting: Physician Assistant

## 2019-07-28 ENCOUNTER — Telehealth: Payer: Self-pay | Admitting: *Deleted

## 2019-07-28 NOTE — Telephone Encounter (Signed)
-----   Message from Roney Jaffe, [place] sent at 07/27/2019  9:15 AM EDT ----- Please call patient and let her know that her A1c did return at 5.8.  This is considered prediabetes, it is recommended that she start Metformin 500 mg once a day and continue to work on improving her diet as we discussed during her office visit.  She will need to keep her follow-up appointment in 3 months.  Her cholesterol was slightly elevated, needs to continue working on improving her diet following a low-cholesterol diet. Her thyroid, kidney and liver function were WNL  Thank you

## 2019-07-28 NOTE — Telephone Encounter (Signed)
Medical Assistant used [employer] to contact patient.  Interpreter Name: Interpreter #: [place] Patient is aware of A1C being prediabetes and needing to start metformin. Patient needs to improve her diet as discussed in the office. Keep her appointment in 3 months Her cholesterol is slightly elevated and will improve with nutrition changes. Patients kidneys, liver and thyroid are normal.

## 2019-07-28 NOTE — Telephone Encounter (Signed)
As instructed by NP/MD called pt/ name and DOB verified/ made aware of results and MD/NP results note. Verbalized understanding  "Message from Roney Jaffe, PA-C sent at 07/27/2019  9:15 AM EDT ----- Please call patient and let her know that her A1c did return at 5.8.  This is considered prediabetes, it is recommended that she start Metformin 500 mg once a day and continue to work on improving her diet as we discussed during her office visit.  She will need to keep her follow-up appointment in 3 months.  Her cholesterol was slightly elevated, needs to continue working on improving her diet following a low-cholesterol diet. Her thyroid, kidney and liver function were WNL"

## 2019-08-02 ENCOUNTER — Encounter: Payer: Self-pay | Admitting: Internal Medicine

## 2019-08-02 ENCOUNTER — Ambulatory Visit: Payer: Self-pay | Attending: Internal Medicine | Admitting: Internal Medicine

## 2019-08-02 ENCOUNTER — Other Ambulatory Visit: Payer: Self-pay

## 2019-08-02 DIAGNOSIS — R7303 Prediabetes: Secondary | ICD-10-CM

## 2019-08-02 DIAGNOSIS — R600 Localized edema: Secondary | ICD-10-CM

## 2019-08-02 DIAGNOSIS — M79643 Pain in unspecified hand: Secondary | ICD-10-CM

## 2019-08-02 NOTE — Progress Notes (Signed)
Virtual Visit via Telephone Note  I connected with Stacie Mitchell on 08/02/19 at  3:00 PM EDT by telephone with interpreter and verified that I am speaking with the correct person using two identifiers.  Location: Patient: just outside of work, in [place] Provider: home   I discussed the limitations, risks, security and privacy concerns of performing an evaluation and management service by telephone and the availability of in person appointments. I also discussed with the patient that there may be a patient responsible charge related to this service. The patient expressed understanding and agreed to proceed.   History of Present Illness: Yesterday woke up with h/a, dizziness, swelling in hands. Difficulty closing hands, difficulty sleeping at night due to cramps. Wakes up few times a night due to pain. Denies hand pain during the day. No orthopnea, no sob. Concerned sx may be due to metformin Using ibu prn  Attributes sx to new job, works 60 hrs/week, has to open boxes, [employer]. Sx x 2 weeks, reports h/a, stress since she started new job  Observations/Objective:   Assessment and Plan: 1. Localized edema Reviewed recent labs, sx prob due to fluid retention, advised to reduce dietary salt, hold ibu, monitor sx, rv if sx not improved  2. Pain of hand, unspecified laterality ?cts, advised to wear wrist splint, monitor sx. rv if sx not improved  3. Prediabetes Doubt sx due to metformin, advised to continue med as prescribed  Follow Up Instructions:    I discussed the assessment and treatment plan with the patient. The patient was provided an opportunity to ask questions and all were answered. The patient agreed with the plan and demonstrated an understanding of the instructions.   The patient was advised to call back or seek an in-person evaluation if the symptoms worsen or if the condition fails to improve as anticipated.  I provided 35 minutes of non-face-to-face time  during this encounter.   Debby Bud, MD

## 2019-08-02 NOTE — Progress Notes (Signed)
Pt is requesting a work note   Pt states she gets hand swelling  Pt states she spoke with her boss making them aware that she can't work 6 days a week she can only work 40 hours  Pt is requesting medication for hands

## 2019-09-09 ENCOUNTER — Ambulatory Visit: Payer: Self-pay | Admitting: Family

## 2019-10-25 ENCOUNTER — Encounter: Payer: Self-pay | Admitting: Family Medicine

## 2019-10-25 ENCOUNTER — Ambulatory Visit: Payer: Self-pay | Attending: Family Medicine | Admitting: Family Medicine

## 2019-10-25 ENCOUNTER — Other Ambulatory Visit: Payer: Self-pay

## 2019-10-25 VITALS — BP 111/68 | HR 78 | Ht 63.0 in | Wt 362.6 lb

## 2019-10-25 DIAGNOSIS — J302 Other seasonal allergic rhinitis: Secondary | ICD-10-CM

## 2019-10-25 DIAGNOSIS — I10 Essential (primary) hypertension: Secondary | ICD-10-CM

## 2019-10-25 DIAGNOSIS — R600 Localized edema: Secondary | ICD-10-CM

## 2019-10-25 DIAGNOSIS — M6740 Ganglion, unspecified site: Secondary | ICD-10-CM

## 2019-10-25 DIAGNOSIS — R7303 Prediabetes: Secondary | ICD-10-CM

## 2019-10-25 LAB — POCT GLYCOSYLATED HEMOGLOBIN (HGB A1C): HbA1c, POC (controlled diabetic range): 5.5 % (ref 0.0–7.0)

## 2019-10-25 MED ORDER — CETIRIZINE HCL 10 MG PO TABS: 10.0000 mg | ORAL_TABLET | Freq: Every day | ORAL | 6 refills | Status: DC

## 2019-10-25 MED ORDER — FAMOTIDINE 20 MG PO TABS: 20.0000 mg | ORAL_TABLET | Freq: Two times a day (BID) | ORAL | 1 refills | Status: DC

## 2019-10-25 MED ORDER — HYDROCHLOROTHIAZIDE 25 MG PO TABS
25.0000 mg | ORAL_TABLET | Freq: Every day | ORAL | 1 refills | Status: DC
Start: 2019-10-25 — End: 2019-12-27

## 2019-10-25 MED ORDER — METFORMIN HCL 500 MG PO TABS: 500.0000 mg | ORAL_TABLET | Freq: Every day | ORAL | 0 refills | Status: DC

## 2019-10-25 MED FILL — HYDROCHLOROTHIAZIDE 25 MG T: 25 | 30 days supply | Qty: 30 | Fill #0

## 2019-10-25 MED FILL — METFORMIN HCL 500 MG TABS: 500 | 30 days supply | Qty: 30 | Fill #0

## 2019-10-25 MED FILL — FAMOTIDINE 20 MG TABS: 20 | 30 days supply | Qty: 60 | Fill #0

## 2019-10-25 NOTE — Patient Instructions (Addendum)
Quiste ganglionar Ganglion Cyst  Un quiste ganglionar es un bulto no canceroso lleno de lquido que se forma cerca de una articulacin o un tendn. El quiste crece fuera de una articulacin o de la membrana de un tendn. La mayora de las veces los quistes ganglionares aparecen en la mano o la mueca, pero tambin pueden formarse en el hombro, el codo, la cadera, la rodilla, el tobillo o el pie. Los quistes ganglionares tienen forma de pelota o huevo. Su tamao puede variar desde el tamao de un guisante a ms grandes que una uva. El incremento de la actividad puede aumentar el tamao del quiste porque empieza a acumularse ms lquido. Cules son las causas? Se desconoce la causa precisa de esta afeccin, pero podra estar relacionada con lo siguiente:  Inflamacin o irritacin alrededor de la articulacin.  Una lesin.  Los movimientos repetitivos o el uso excesivo.  Artritis. Qu incrementa el riesgo? Es ms probable que usted sufra esta afeccin si:  Es mujer.  Tiene entre 15y40aos de edad. Cules son los signos o los sntomas? El sntoma principal de esta afeccin es un bulto. Aparece con mayor frecuencia en la mano o la mueca. En muchos casos, no hay otros sntomas, pero el quiste a veces puede provocar:  Hormigueo.  Dolor.  Entumecimiento.  Debilidad muscular.  Agarre dbil.  Menor rango de movimientos en la articulacin. Cmo se diagnostica? En general los quistes ganglionares se diagnostican en funcin de un examen fsico. El mdico palpar el bulto y puede iluminarlo con una luz. Si es un quiste ganglionar, es probable que la luz pase a travs de l. El mdico puede indicarle una radiografa, una ecografa o una resonancia magntica (RM) para descartar otras afecciones. Cmo se trata? A menudo los quistes ganglionares desaparecen solos sin tratamiento. Si tiene dolor u otros sntomas, tal vez se necesite tratamiento. Tambin es necesario un tratamiento si el  quiste ganglionar limita sus movimientos o si se infecta. El tratamiento puede incluir lo siguiente:  Usar una frula o una tablilla en la mueca o el dedo.  Medicamentos antiinflamatorios.  Extraer lquido del bulto con una aguja (aspiracin).  Inyectar un corticoesteroides en la articulacin.  Realizar una ciruga para extirpar el quiste ganglionar.  Colocar una almohadilla en el zapato o usar zapatos que no rocen el quiste si lo tiene en el pie. Siga estas indicaciones en su casa:  No haga presin sobre el quiste ganglionar, no lo pinche con una aguja ni lo golpee.  Tome los medicamentos de venta libre y los recetados solamente como se lo haya indicado el mdico.  Si tiene un dispositivo ortopdico o una frula: ? selo como se lo haya indicado el mdico. ? Quteselo como se lo haya indicado el mdico. Consulte si debe quitrselo cuando toma una ducha o un bao.  Controle el quiste ganglionar para detectar cualquier cambio.  Concurra a todas las visitas de seguimiento como se lo haya indicado el mdico. Esto es importante. Comunquese con un mdico si:  El quiste ganglionar se agranda o le provoca ms dolor.  Observa que sale pus del bulto.  Siente debilidad o adormecimiento alrededor de la zona afectada.  Tiene fiebre o siente escalofros. Solicite ayuda de inmediato si:  Tiene fiebre y tiene alguno de estos signos en la zona del quiste: ? Aumento del enrojecimiento. ? Lneas rojas. ? Hinchazn. Resumen  Un quiste ganglionar es un bulto no canceroso lleno de lquido que se forma cerca de una articulacin o un tendn.    La [employer] de las [employer] quistes ganglionares aparecen en la mano o la [place], pero tambin pueden formarse en el hombro, el codo, la cadera, la rodilla, el tobillo o el pie.  A menudo los quistes ganglionares desaparecen solos sin tratamiento. Esta informacin no tiene Theme park manager el consejo del mdico. Asegrese de hacerle al mdico  cualquier pregunta que tenga. Document Revised: 03/18/2017 Document Reviewed: 03/18/2017 Elsevier Patient Education  2020 [employer].
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
## 2019-10-25 NOTE — Progress Notes (Signed)
Painful knot on both wrist.  Swelling in legs and very painful  Having headaches, sore throat and hard to swallow.

## 2019-11-04 ENCOUNTER — Ambulatory Visit: Payer: Self-pay

## 2019-11-04 ENCOUNTER — Other Ambulatory Visit: Payer: Self-pay

## 2019-11-21 ENCOUNTER — Other Ambulatory Visit: Payer: Self-pay | Admitting: Family Medicine

## 2019-11-21 NOTE — Telephone Encounter (Signed)
Medication: famotidine (PEPCID) 20 MG tablet [[id number]]   Has the patient contacted their pharmacy? YES (Agent: If no, request that the patient contact the pharmacy for the refill.) (Agent: If yes, when and what did the pharmacy advise?)  Preferred Pharmacy (with phone number or street name): [facility] - [place], [place] - [address]  Phone:  [phone] Fax:  [phone]     Agent: Please be advised that RX refills may take up to 3 business days. We ask that you follow-up with your pharmacy.

## 2019-11-22 MED FILL — METFORMIN HCL 500 MG TABS: 500 | 30 days supply | Qty: 30 | Fill #1

## 2019-11-22 MED FILL — IBUPROFEN 600 MG TABLET: 600 | 30 days supply | Qty: 90 | Fill #0

## 2019-11-22 MED FILL — FAMOTIDINE 20 MG TABS: 20 | 30 days supply | Qty: 60 | Fill #1

## 2019-11-22 MED FILL — HYDROCHLOROTHIAZIDE 25 MG T: 25 | 30 days supply | Qty: 30 | Fill #1

## 2019-11-22 MED FILL — FLUTICASONE PROP 50 MCG SPR: 50 | 30 days supply | Qty: 16 | Fill #1

## 2019-12-02 ENCOUNTER — Ambulatory Visit: Payer: Self-pay | Admitting: Internal Medicine

## 2019-12-07 ENCOUNTER — Encounter: Payer: Self-pay | Admitting: Family Medicine

## 2019-12-07 ENCOUNTER — Other Ambulatory Visit: Payer: Self-pay

## 2019-12-07 ENCOUNTER — Ambulatory Visit: Payer: Self-pay | Attending: Family Medicine | Admitting: Family Medicine

## 2019-12-07 VITALS — BP 130/84 | HR 87 | Ht 63.0 in | Wt 361.0 lb

## 2019-12-07 DIAGNOSIS — Z113 Encounter for screening for infections with a predominantly sexual mode of transmission: Secondary | ICD-10-CM

## 2019-12-07 DIAGNOSIS — R109 Unspecified abdominal pain: Secondary | ICD-10-CM

## 2019-12-07 DIAGNOSIS — N898 Other specified noninflammatory disorders of vagina: Secondary | ICD-10-CM

## 2019-12-07 DIAGNOSIS — Z124 Encounter for screening for malignant neoplasm of cervix: Secondary | ICD-10-CM

## 2019-12-07 DIAGNOSIS — R3129 Other microscopic hematuria: Secondary | ICD-10-CM

## 2019-12-07 LAB — POCT URINALYSIS DIP (CLINITEK)
Bilirubin, UA: NEGATIVE
Glucose, UA: NEGATIVE mg/dL
Ketones, POC UA: NEGATIVE mg/dL
Leukocytes, UA: NEGATIVE
Nitrite, UA: NEGATIVE
POC PROTEIN,UA: NEGATIVE
Spec Grav, UA: >=1.03 — AB (ref 1.010–1.025)
Urobilinogen, UA: 0.2 E.U./dL
pH, UA: 5.5 (ref 5.0–8.0)

## 2019-12-07 MED ORDER — FLUCONAZOLE 150 MG PO TABS: 150.0000 mg | ORAL_TABLET | Freq: Once | ORAL | 0 refills | Status: AC

## 2019-12-07 MED FILL — FLUCONAZOLE 150 MG TABLET: 150 | 1 days supply | Qty: 1 | Fill #0
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
## 2019-12-07 NOTE — Progress Notes (Signed)
Has pain in lower right side of back.  States that sh is having vaginal itching, [facility] with [facility].  Strong smelling urine.

## 2019-12-07 NOTE — Patient Instructions (Signed)
Hematuria, Adult Hematuria is blood in the urine. Blood may be visible in the urine, or it may be identified with a test. This condition can be caused by infections of the bladder, urethra, kidney, or prostate. Other possible causes include:  Kidney stones.  Cancer of the urinary tract.  Too much calcium in the urine.  Conditions that are passed from parent to child (inherited conditions).  Exercise that requires a lot of energy. Infections can usually be treated with medicine, and a kidney stone usually will pass through your urine. If neither of these is the cause of your hematuria, more tests may be needed to identify the cause of your symptoms. It is very important to tell your health care provider about any blood in your urine, even if it is painless or the blood stops without treatment. Blood in the urine, when it happens and then stops and then happens again, can be a symptom of a very serious condition, including cancer. There is no pain in the initial stages of many urinary cancers. Follow these instructions at home: Medicines  Take over-the-counter and prescription medicines only as told by your health care provider.  If you were prescribed an antibiotic medicine, take it as told by your health care provider. Do not stop taking the antibiotic even if you start to feel better. Eating and drinking  Drink enough fluid to keep your urine clear or pale yellow. It is recommended that you drink 3-4 quarts (2.8-3.8 L) a day. If you have been diagnosed with an infection, it is recommended that you drink cranberry juice in addition to large amounts of water.  Avoid caffeine, tea, and carbonated beverages. These tend to irritate the bladder.  Avoid alcohol because it may irritate the prostate (men). General instructions  If you have been diagnosed with a kidney stone, follow your health care provider's instructions about straining your urine to catch the stone.  Empty your bladder  often. Avoid holding urine for long periods of time.  If you are female: ? After a bowel movement, wipe from front to back and use each piece of toilet paper only once. ? Empty your bladder before and after sex.  Pay attention to any changes in your symptoms. Tell your health care provider about any changes or any new symptoms.  It is your responsibility to get your test results. Ask your health care provider, or the department performing the test, when your results will be ready.  Keep all follow-up visits as told by your health care provider. This is important. Contact a health care provider if:  You develop back pain.  You have a fever.  You have nausea or vomiting.  Your symptoms do not improve after 3 days.  Your symptoms get worse. Get help right away if:  You develop severe vomiting and are unable take medicine without vomiting.  You develop severe pain in your back or abdomen even though you are taking medicine.  You pass a large amount of blood in your urine.  You pass blood clots in your urine.  You feel very weak or like you might faint.  You faint. Summary  Hematuria is blood in the urine. It has many possible causes.  It is very important that you tell your health care provider about any blood in your urine, even if it is painless or the blood stops without treatment.  Take over-the-counter and prescription medicines only as told by your health care provider.  Drink enough fluid to keep   your urine clear or pale yellow. This information is not intended to replace advice given to you by your health care provider. Make sure you discuss any questions you have with your health care provider. Document Revised: 08/25/2018 Document Reviewed: 05/03/2016 Elsevier Patient Education  2020 [employer]  

## 2019-12-09 LAB — CYTOLOGY - PAP
Comment: NEGATIVE
Diagnosis: NEGATIVE
High risk HPV: NEGATIVE

## 2019-12-09 LAB — CERVICOVAGINAL ANCILLARY ONLY
Bacterial Vaginitis (gardnerella): NEGATIVE
Candida Glabrata: NEGATIVE
Candida Vaginitis: NEGATIVE
Chlamydia: NEGATIVE
Comment: NEGATIVE
Comment: NEGATIVE
Comment: NEGATIVE
Comment: NEGATIVE
Comment: NEGATIVE
Comment: NORMAL
Neisseria Gonorrhea: NEGATIVE
Trichomonas: NEGATIVE

## 2019-12-12 ENCOUNTER — Telehealth: Payer: Self-pay

## 2019-12-12 NOTE — Telephone Encounter (Signed)
-----   Message from [name], MD sent at 12/12/2019 10:53 AM EDT ----- Please inform the patient that labs are normal. Thank you.

## 2019-12-12 NOTE — Telephone Encounter (Signed)
Patient name and DOB has been verified Patient was informed of lab results. Patient had no questions.  

## 2019-12-26 ENCOUNTER — Telehealth: Payer: Self-pay | Admitting: Family Medicine

## 2019-12-26 ENCOUNTER — Other Ambulatory Visit: Payer: Self-pay

## 2019-12-26 DIAGNOSIS — R7303 Prediabetes: Secondary | ICD-10-CM

## 2019-12-27 ENCOUNTER — Other Ambulatory Visit: Payer: Self-pay

## 2019-12-27 ENCOUNTER — Ambulatory Visit (HOSPITAL_COMMUNITY)
Admission: RE | Admit: 2019-12-27 | Discharge: 2019-12-27 | Disposition: A | Discharge status: Home / Self Care | Payer: Self-pay | Source: Ambulatory Visit | Attending: Family Medicine | Admitting: Family Medicine

## 2019-12-27 ENCOUNTER — Other Ambulatory Visit: Payer: Self-pay | Admitting: Family Medicine

## 2019-12-27 DIAGNOSIS — J302 Other seasonal allergic rhinitis: Secondary | ICD-10-CM

## 2019-12-27 DIAGNOSIS — L2089 Other atopic dermatitis: Secondary | ICD-10-CM

## 2019-12-27 DIAGNOSIS — R109 Unspecified abdominal pain: Secondary | ICD-10-CM | POA: Insufficient documentation

## 2019-12-27 DIAGNOSIS — R7303 Prediabetes: Secondary | ICD-10-CM

## 2019-12-27 MED ORDER — HYDROCHLOROTHIAZIDE 25 MG PO TABS
25.0000 mg | ORAL_TABLET | Freq: Every day | ORAL | 1 refills | Status: DC
Start: 2019-12-27 — End: 2020-01-13

## 2019-12-27 MED ORDER — FAMOTIDINE 20 MG PO TABS
20.0000 mg | ORAL_TABLET | Freq: Two times a day (BID) | ORAL | 1 refills | Status: DC
Start: 2019-12-27 — End: 2020-01-13

## 2019-12-27 MED ORDER — METFORMIN HCL 500 MG PO TABS: 500.0000 mg | ORAL_TABLET | Freq: Every day | ORAL | 0 refills | Status: DC

## 2019-12-27 MED ORDER — CETIRIZINE HCL 10 MG PO TABS: 10.0000 mg | ORAL_TABLET | Freq: Every day | ORAL | 6 refills | Status: DC

## 2019-12-27 MED FILL — FAMOTIDINE 20 MG TABS: 20 | 30 days supply | Qty: 60 | Fill #0

## 2019-12-27 MED FILL — HYDROCHLOROTHIAZIDE 25 MG T: 25 | 30 days supply | Qty: 30 | Fill #0

## 2019-12-27 MED FILL — ?CETIRIZINE HCL 10 MG TABLE: 10 | 30 days supply | Qty: 30 | Fill #0

## 2019-12-27 MED FILL — ?METFORMIN HCL 500MG TABL: 500 | 30 days supply | Qty: 30 | Fill #0

## 2019-12-27 NOTE — Telephone Encounter (Signed)
Medication has been refilled.

## 2019-12-29 ENCOUNTER — Telehealth: Payer: Self-pay | Admitting: Family Medicine

## 2019-12-29 NOTE — Telephone Encounter (Signed)
Copied from CRM [id number]. Topic: General - Call Back - No Documentation >> Dec 29, 2019  2:04 PM Randol Kern wrote: Reason for CRM: Pt wants call back to discuss [place] results, please advise  Best contact: [phone]

## 2019-12-29 NOTE — Telephone Encounter (Signed)
Patient was called and informed that ultrasound has not been viewed by provider yet. She was informed that she will be contacted once results are in.

## 2019-12-30 ENCOUNTER — Telehealth: Payer: Self-pay

## 2019-12-30 DIAGNOSIS — W19XXXA Unspecified fall, initial encounter: Secondary | ICD-10-CM

## 2019-12-30 NOTE — Telephone Encounter (Signed)
-----   Message from [name], MD sent at 12/30/2019  9:31 AM EDT ----- CT is negative for kidney stones but reveals presence of a simple cyst. Nothing additional needs to be done at this time for that.

## 2019-12-30 NOTE — Telephone Encounter (Signed)
Patient name and DOB has been verified Patient was informed of lab results. Patient had no questions.  Patient states that she is having pain and that her periods are light flow. Patient would like to know what she can do for pain.

## 2020-01-02 MED ORDER — IBUPROFEN 600 MG PO TABS: 600.0000 mg | ORAL_TABLET | Freq: Three times a day (TID) | ORAL | 1 refills | Status: DC | PRN

## 2020-01-02 MED FILL — ?IBUPROFEN 600 MG TABLETS: 600 | 30 days supply | Qty: 90 | Fill #0

## 2020-01-02 NOTE — Telephone Encounter (Signed)
Patient is requesting refill on ibuprofen.

## 2020-01-02 NOTE — Telephone Encounter (Signed)
She can use Ibuprofen 600mg  q8 hrs prn

## 2020-01-02 NOTE — Telephone Encounter (Signed)
Done

## 2020-01-03 MED FILL — FAMOTIDINE 20 MG TABS: 20 | 30 days supply | Qty: 60 | Fill #0

## 2020-01-03 MED FILL — ?CETIRIZINE HCL 10 MG TABLE: 10 | 30 days supply | Qty: 30 | Fill #0

## 2020-01-03 MED FILL — ?METFORMIN HCL 500MG TABL: 500 | 30 days supply | Qty: 30 | Fill #0

## 2020-01-03 MED FILL — HYDROCHLOROTHIAZIDE 25 MG T: 25 | 30 days supply | Qty: 30 | Fill #0

## 2020-01-09 ENCOUNTER — Ambulatory Visit: Payer: Self-pay

## 2020-01-09 NOTE — Telephone Encounter (Signed)
Patient experiencing lower back and abdominal pain, vaginal burning and irration, patient scheduled with PCP on 01/13/2020. Seeking clinical advice prior to appointment. (Also sent message to PCP for a possible work in prior, patient has no GYN seeking referral to specialist who speaks spanish) * spanish interpreter needed Using Spanish interpreter Maureen Ralphs, # [phone], spoke with pt. States back and abdominal pain started a year ago, getting worse. Last period 2-3 months ago.Also has vaginal pain that comes and goes. No vomiting or diarrhea. Pt. Has an appointment 01/13/20. Asking to be worked in sooner. Please advise pt. Answer Assessment - Initial Assessment Questions 1. LOCATION: "Where does it hurt?"      Lower abdomen 2. RADIATION: "Does the pain shoot anywhere else?" (e.g., chest, back)     Back 3. ONSET: "When did the pain begin?" (e.g., minutes, hours or days ago)      Started last year 4. SUDDEN: "Gradual or sudden onset?"     Gradual 5. PATTERN "Does the pain come and go, or is it constant?"    - If constant: "Is it getting better, staying the same, or worsening?"      (Note: Constant means the pain never goes away completely; most serious pain is constant and it progresses)     - If intermittent: "How long does it last?" "Do you have pain now?"     (Note: Intermittent means the pain goes away completely between bouts)     Constant 6. SEVERITY: "How bad is the pain?"  (e.g., Scale 1-10; mild, moderate, or severe)   - MILD (1-3): doesn't interfere with normal activities, abdomen soft and not tender to touch    - MODERATE (4-7): interferes with normal activities or awakens from sleep, tender to touch    - SEVERE (8-10): excruciating pain, doubled over, unable to do any normal activities      12 7. RECURRENT SYMPTOM: "Have you ever had this type of stomach pain before?" If Yes, ask: "When was the last time?" and "What happened that time?"      No 8. CAUSE: "What do you think is causing the  stomach pain?"     Unsure 9. RELIEVING/AGGRAVATING FACTORS: "What makes it better or worse?" (e.g., movement, antacids, bowel movement)     No 10. OTHER SYMPTOMS: "Has there been any vomiting, diarrhea, constipation, or urine problems?"       No 11. PREGNANCY: "Is there any chance you are pregnant?" "When was your last menstrual period?"       Last period 2 months ago.  Protocols used: ABDOMINAL PAIN [facility]

## 2020-01-10 NOTE — Telephone Encounter (Signed)
Patient has appointment with a provider on Friday, matter will be discussed then no opening at this time for her PCP

## 2020-01-13 ENCOUNTER — Ambulatory Visit: Payer: Self-pay | Attending: Nurse Practitioner | Admitting: Nurse Practitioner

## 2020-01-13 ENCOUNTER — Encounter: Payer: Self-pay | Admitting: Nurse Practitioner

## 2020-01-13 ENCOUNTER — Other Ambulatory Visit: Payer: Self-pay

## 2020-01-13 ENCOUNTER — Other Ambulatory Visit: Payer: Self-pay | Admitting: Nurse Practitioner

## 2020-01-13 VITALS — BP 130/85 | HR 79 | Resp 16 | Wt 366.8 lb

## 2020-01-13 DIAGNOSIS — M79671 Pain in right foot: Secondary | ICD-10-CM

## 2020-01-13 DIAGNOSIS — J302 Other seasonal allergic rhinitis: Secondary | ICD-10-CM

## 2020-01-13 DIAGNOSIS — K219 Gastro-esophageal reflux disease without esophagitis: Secondary | ICD-10-CM

## 2020-01-13 DIAGNOSIS — I10 Essential (primary) hypertension: Secondary | ICD-10-CM

## 2020-01-13 DIAGNOSIS — R102 Pelvic and perineal pain: Secondary | ICD-10-CM

## 2020-01-13 LAB — POCT URINE PREGNANCY: Preg Test, Ur: NEGATIVE

## 2020-01-13 MED ORDER — FAMOTIDINE 20 MG PO TABS: 20.0000 mg | ORAL_TABLET | Freq: Two times a day (BID) | ORAL | 1 refills | Status: DC

## 2020-01-13 MED ORDER — FLUTICASONE PROPIONATE 50 MCG/ACT NA SUSP: NASAL | 6 refills | Status: DC

## 2020-01-13 MED ORDER — MELOXICAM 7.5 MG PO TABS: 7.5000 mg | ORAL_TABLET | Freq: Every day | ORAL | 0 refills | Status: DC

## 2020-01-13 MED ORDER — HYDROCHLOROTHIAZIDE 25 MG PO TABS: 25.0000 mg | ORAL_TABLET | Freq: Every day | ORAL | 1 refills | Status: DC

## 2020-01-13 MED ORDER — CETIRIZINE HCL 10 MG PO TABS: 10.0000 mg | ORAL_TABLET | Freq: Every day | ORAL | 6 refills | Status: DC

## 2020-01-13 MED FILL — MELOXICAM 7.5 MG TABLET: 7.5 | 30 days supply | Qty: 30 | Fill #0

## 2020-01-13 MED FILL — FLUTICASONE PROP 50 MCG SPR: 50 | 60 days supply | Qty: 32 | Fill #0
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
## 2020-01-13 NOTE — Patient Instructions (Signed)
Espoln calcneo [place]  Los espolones calcneos son protuberancias seas que se forman en la parte inferior del hueso del taln (calcneo). Los espolones calcneos son frecuentes. A menudo causan inflamacin en la banda de tejido que [employer] dedos con el hueso del taln (fascia plantar). Esto puede causar dolor en la zona inferior del pie, cerca del taln. Muchas personas con fascitis plantar tambin presentan espolones calcneos. Sin embargo, los espolones no son la causa del dolor de la fascitis plantar. Cules son las causas? Se desconoce la causa exacta de los espolones calcneos. Las [employer] ser las siguientes:  Presin sobre el hueso del taln.  Bandas de tejido (tendones) que tiran del hueso del taln. Qu incrementa el riesgo? Es ms probable que tenga esta afeccin si:  Es mayor de 40 aos.  Tiene sobrepeso.  Padece artritis debido al deterioro (artrosis).  Presenta inflamacin en la fascia plantar.  Participa en deportes o actividades que incluyen correr o Product/process development scientist.  [place] zapatos que no tienen buen calce. Cules son los signos o los sntomas? Algunas personas no tienen sntomas. Si tiene sntomas, pueden incluir los siguientes:  Dolor en la parte inferior del taln.  Dolor que empeora al levantarse de la cama por primera vez.  Dolor que empeora despus de caminar o ponerse de pie. Cmo se diagnostica? Esta afeccin se puede diagnosticar en funcin de lo siguiente:  Los sntomas y antecedentes mdicos.  Un examen fsico.  Una radiografa del pie. Cmo se trata? El tratamiento de esta afeccin depende de cunto dolor sienta. Las opciones de tratamiento pueden incluir las siguientes:  Hacer ejercicios de elongacin.  Bajar de [place], si es necesario.  Usar calzados o plantillas especficos dentro del calzado (aparatos ortopdicos) para sentir comodidad y apoyo.  Usar frulas en los pies mientras duerme. Las frulas [employer] pies en una  posicin (generalmente a 90 grados) que debe prevenir y Engineer, materials que siente al levantarse de la cama por primera vez. Adems, facilitan el estiramiento en la maana.  Tomar medicamentos de venta libre para Engineer, materials, como antiinflamatorios no esteroideos (Ovid).  Utilizar ondas sonoras de alta intensidad para fragmentar el espoln calcneo (terapia extracorprea por ondas de choque).  Recibir inyecciones de corticoesteroides en el taln para reducir la inflamacin.  Someterse a Transport planner calcneo causa dolor a Air cabin crew (crnico). Siga estas indicaciones en su casa:  Actividad  Evite las actividades que le causen dolor hasta que se recupere o durante el tiempo que le haya indicado el mdico.  Realice ejercicios de estiramiento como se le indic. Elongue antes de hacer ejercicio o actividad fsica. Control del dolor, la rigidez y la hinchazn  Si se lo indican, aplquese hielo sobre el pie: ? Ponga el hielo en una bolsa plstica. ? Coloque una [employer] piel y la bolsa de hielo. ? Coloque el hielo durante , de 2 a 3veces por da.  Mueva los dedos de los pies con frecuencia para evitar la rigidez y para reducir la hinchazn.  Cuando sea posible, levante (eleve) el pie por encima del nivel del corazn mientras est sentado o acostado. Instrucciones generales  [employer] de venta libre y los recetados solamente como se lo haya indicado el mdico.  Use zapatos con buen apoyo y buen calce. Use las frulas, plantillas o aparatos ortopdicos como se lo haya indicado el mdico.  Si se lo recomiendan, consulte a su mdico para bajar de peso. Esto puede [employer]  la presin en el pie.  No consuma ningn producto que contenga nicotina o tabaco, como cigarrillos y Administrator, Civil Service. Estos productos pueden afectar el crecimiento seo y Comptroller. Si necesita ayuda para dejar de fumar, consulte al mdico.  Concurra a todas las  visitas de control como se lo haya indicado el mdico. Esto es importante. Comunquese con un mdico si:  El dolor no desaparece con Scientist, research (medical).  El dolor [place]. Resumen  Los espolones calcneos son protuberancias seas que se forman en la parte inferior del hueso del taln (calcneo).  Los espolones calcneos suelen causar inflamacin en la banda de tejido que [employer] dedos del pie con el hueso del taln (fascia plantar). Esto puede causar dolor en la zona inferior del pie, cerca del taln.  Hacer ejercicios de elongacin, bajar de peso, usar zapatos o Catering manager, usar frulas [employer]	[place] duerme y tomar analgsicos puede Engineer, materials y la rigidez.  Otras opciones de tratamiento pueden incluir ondas sonoras de alta intensidad para fragmentar el espoln calcneo, inyecciones con corticoesteroides y [place]. Esta informacin no tiene Theme park manager el consejo del mdico. Asegrese de hacerle al mdico cualquier pregunta que tenga. Document Revised: 05/19/2017 Document Reviewed: 05/19/2017 Elsevier Patient Education  2020 Elsevier Inc.  Fascitis plantar Plantar Fasciitis  La fascitis plantar es una afeccin dolorosa que se produce en el taln. Ocurre cuando la banda de tejido que [employer] dedos con el hueso del taln (fascia plantar) se irrita. Esto puede ocurrir por Academic librarian ejercicio u otras actividades repetitivas (lesin por uso excesivo). El dolor de la fascitis plantar puede abarcar desde una leve irritacin a dolor intenso, y en los casos ms agudos puede dificultar que la persona camine o se [employer]. Por lo general, el dolor es peor a la maana despus de dormir, o despus de Personal assistant sentado o acostado durante un tiempo. El dolor tambin puede empeorar despus de caminar o estar de pie por [employer]. Cules son las causas? Esta afeccin puede ser causada por lo siguiente:  Estar de pie durante largos perodos.  Usar zapatos que no tengan un  buen soporte para el arco.  Practicar actividades que ponen tensin en las articulaciones (actividades de alto impacto), como correr, Radio producer ejercicios aerbicos o Market researcher.  Tener sobrepeso.  Tener una forma de caminar (un andar) anormal.  Presentar rigidez muscular en la parte posterior de la parte inferior de la pierna (pantorrilla).  Tener un arco alto de los pies.  Comenzar una nueva actividad fsica. Cules son los signos o los sntomas? El sntoma principal de esta afeccin es el dolor en el taln. El dolor:  Puede empeorar con los primeros pasos luego de estar en reposo, especialmente por la maana despus de dormir o de haber estado sentado o acostado durante [place].  Puede empeorar despus de largos perodos de estar parado.  Puede disminuir despus de 30 a 45 minutos de [place], como caminar apaciblemente. Cmo se diagnostica? Esta afeccin puede diagnosticarse en funcin de sus antecedentes mdicos y sus sntomas. Su mdico puede preguntarle sobre su nivel de [place]. El Production manager un examen fsico para controlar si tiene lo siguiente:  Un rea dolorida en la parte inferior del pie.  El arco alto.  Dolor al Doctor, general practice.  Dificultad para mover el pie. Pueden realizarle estudios de diagnstico por imagen para confirmar el diagnstico, por ejemplo:  Radiografas.  Ecografa.  Resonancia magntica (RM). Cmo se trata? El tratamiento de la fascitis plantar  depende de la gravedad de su afeccin. El tratamiento puede incluir lo siguiente:  Reposo, hielo, presin (compresin) y Lexicographer el pie afectado (elevacin). Esto puede conocerse como Terapia de RHCE (Reposo, hielo, compresin, elevacin). El mdico puede recomendarle Terapia de RHCE junto con medicamentos de venta libre para Engineer, materials.  Ejercicios para estirar las pantorrillas y la fascia plantar.  Una frula que [employer] estirado y [place] mientras usted duerme  (frula nocturna).  Fisioterapia para [employer] sntomas y Physiological scientist en el futuro.  Inyecciones de corticoesteroides para Engineer, materials y la inflamacin.  Estimular su fascia plantar lesionada con impulsos elctricos (tratamiento con ondas de choque extracorprea). Esto generalmente es la ltima opcin de tratamiento antes de la [place].  [place], si los otros tratamientos no han funcionado despus de . Siga estas indicaciones en su casa:  Control del dolor, la rigidez y la hinchazn  Si se lo indican, aplique hielo sobre la zona del dolor: ? Ponga el hielo en una bolsa de plstico o use una botella de agua congelada. ? Coloque una [employer] piel y la bolsa de hielo o la botella. ? Frote la parte inferior del pie sobre la bolsa o la botella. ? Haga esto durante , de 2a 3veces al [employer].  Use calzado deportivo con amortiguacin de aire o gel, o intente usar plantillas blandas diseadas para la fascitis plantar.  Cuando est sentado o acostado, levante (eleve) el pie por encima del nivel del corazn. Actividad  Evite las actividades que causan dolor. Pregntele al mdico qu actividades son seguras para usted.  Haga los ejercicios de fisioterapia y estiramiento como se lo haya indicado el mdico.  Intente hacer actividades y tipos de ejercicio que sean ms fciles para las articulaciones (de bajo impacto). Por ejemplo, nadar, hacer ejercicios [employer], y Lobbyist. Instrucciones generales  [employer] de venta libre y los recetados solamente como se lo haya indicado el mdico.  Si el mdico se lo indica, use una frula nocturna para dormir. Afloje la frula si los dedos de los pies se le entumecen, siente hormigueos o se le enfran y se tornan de Research officer, trade union.  Mantenga un peso saludable, o colabore con su mdico para perder [employer].  Concurra a todas las visitas de control como se lo haya indicado el mdico. Esto es  importante. Comunquese con un mdico si:  Tiene sntomas que no desaparecen despus tratarlos en su casa.  Siente un dolor que [place].  Siente un dolor que afecta su capacidad de moverse o de Education officer, environmental sus actividades diarias. Resumen  La fascitis plantar es una afeccin dolorosa que se produce en el taln. Ocurre cuando la banda de tejido que [employer] dedos con el hueso del taln (fascia plantar) se irrita.  El sntoma principal de esta afeccin es Chief Technology Officer en el taln que puede empeorar despus de hacer mucho ejercicio o de Personal assistant parado por mucho tiempo.  El tratamiento vara, pero por lo general comienza con reposo, hielo, compresin, y elevacin (Tratamiento de RHCE) y los medicamentos de venta libre para Human resources officer. Esta informacin no tiene Theme park manager el consejo del mdico. Asegrese de hacerle al mdico cualquier pregunta que tenga. Document Revised: 07/11/2017 Document Reviewed: 03/22/2017 Elsevier Patient Education  2020 [employer].

## 2020-01-13 NOTE — Progress Notes (Signed)
Pt states she is having a lot of pelvic pain  Pt states she is also having some foot pain

## 2020-01-14 ENCOUNTER — Encounter: Payer: Self-pay | Admitting: Nurse Practitioner

## 2020-02-14 ENCOUNTER — Other Ambulatory Visit: Payer: Self-pay | Admitting: Nurse Practitioner

## 2020-02-14 DIAGNOSIS — M79671 Pain in right foot: Secondary | ICD-10-CM

## 2020-02-14 MED FILL — MELOXICAM 7.5 MG TABLET: 7.5 | 30 days supply | Qty: 30 | Fill #0

## 2020-02-14 MED FILL — ?FAMOTIDINE 20MG TABLET: 20 | 30 days supply | Qty: 60 | Fill #1

## 2020-02-14 MED FILL — ?METFORMIN HCL 500MG TABL: 500 | 30 days supply | Qty: 30 | Fill #1

## 2020-02-14 MED FILL — ?CETIRIZINE HCL 10 MG TABLE: 10 | 30 days supply | Qty: 30 | Fill #1

## 2020-02-14 MED FILL — HYDROCHLOROTHIAZIDE 25 MG T: 25 | 30 days supply | Qty: 30 | Fill #1

## 2020-02-14 NOTE — Telephone Encounter (Signed)
Requested Prescriptions  Pending Prescriptions Disp Refills  . meloxicam (MOBIC) 7.5 MG tablet [Pharmacy Med Name: MELOXICAM 7.5 MG TABLET 7.5 Tablet] 30 tablet 0    Sig: TAKE 1 TABLET (7.5 MG TOTAL) BY MOUTH DAILY. RIGHT HEEL PAIN     Analgesics:  COX2 Inhibitors Passed - 02/14/2020  2:57 PM      Passed - HGB in normal range and within 360 days    Hemoglobin  Date Value Ref Range Status  07/21/2019 13.2 11.1 - 15.9 g/dL Final         Passed - Cr in normal range and within 360 days    Creat  Date Value Ref Range Status  08/09/2015 0.58 0.50 - 1.10 mg/dL Final   Creatinine, Ser  Date Value Ref Range Status  07/21/2019 0.68 0.57 - 1.00 mg/dL Final   Creatinine, Urine  Date Value Ref Range Status  10/05/2009 54.3 mg/dL Final         Passed - Patient is not pregnant      Passed - Valid encounter within last 12 months    Recent Outpatient Visits          1 month ago Inflammatory heel pain, right   [facility] [place], Shea Stakes, NP   2 months ago Screening for cervical cancer   [facility] [place], Odette Horns, MD   3 months ago Prediabetes   [facility] [name], MD   6 months ago Localized edema   [facility] Debby Bud, MD   6 months ago Prediabetes   [facility] [place], [place], PA-C      Future Appointments            In 3 months [name], MD [facility]

## 2020-02-20 MED FILL — ?FAMOTIDINE 20MG TABLET: 20 | 30 days supply | Qty: 60 | Fill #1

## 2020-03-21 ENCOUNTER — Ambulatory Visit (INDEPENDENT_AMBULATORY_CARE_PROVIDER_SITE_OTHER): Payer: Self-pay | Admitting: Obstetrics & Gynecology

## 2020-03-21 ENCOUNTER — Encounter: Payer: Self-pay | Admitting: Obstetrics & Gynecology

## 2020-03-21 ENCOUNTER — Encounter: Payer: Self-pay | Admitting: Family Medicine

## 2020-03-21 ENCOUNTER — Other Ambulatory Visit: Payer: Self-pay

## 2020-03-21 ENCOUNTER — Other Ambulatory Visit: Payer: Self-pay | Admitting: Family Medicine

## 2020-03-21 ENCOUNTER — Ambulatory Visit: Payer: Self-pay | Attending: Family Medicine | Admitting: Family Medicine

## 2020-03-21 VITALS — BP 125/80 | HR 89 | Ht 63.0 in | Wt 359.6 lb

## 2020-03-21 VITALS — BP 148/90 | HR 76 | Wt 357.3 lb

## 2020-03-21 DIAGNOSIS — K219 Gastro-esophageal reflux disease without esophagitis: Secondary | ICD-10-CM

## 2020-03-21 DIAGNOSIS — M722 Plantar fascial fibromatosis: Secondary | ICD-10-CM

## 2020-03-21 DIAGNOSIS — J302 Other seasonal allergic rhinitis: Secondary | ICD-10-CM

## 2020-03-21 DIAGNOSIS — R1033 Periumbilical pain: Secondary | ICD-10-CM

## 2020-03-21 DIAGNOSIS — R7303 Prediabetes: Secondary | ICD-10-CM

## 2020-03-21 DIAGNOSIS — R102 Pelvic and perineal pain: Secondary | ICD-10-CM

## 2020-03-21 DIAGNOSIS — N83209 Unspecified ovarian cyst, unspecified side: Secondary | ICD-10-CM

## 2020-03-21 DIAGNOSIS — I10 Essential (primary) hypertension: Secondary | ICD-10-CM

## 2020-03-21 MED ORDER — FLUTICASONE PROPIONATE 50 MCG/ACT NA SUSP: NASAL | 3 refills | Status: DC

## 2020-03-21 MED ORDER — MELOXICAM 7.5 MG PO TABS: 7.5000 mg | ORAL_TABLET | Freq: Every day | ORAL | 1 refills | Status: DC

## 2020-03-21 MED ORDER — HYDROCHLOROTHIAZIDE 25 MG PO TABS: 25.0000 mg | ORAL_TABLET | Freq: Every day | ORAL | 1 refills | Status: DC

## 2020-03-21 MED ORDER — FAMOTIDINE 20 MG PO TABS: 20.0000 mg | ORAL_TABLET | Freq: Two times a day (BID) | ORAL | 1 refills | Status: DC

## 2020-03-21 MED ORDER — METFORMIN HCL 500 MG PO TABS: 500.0000 mg | ORAL_TABLET | Freq: Every day | ORAL | 1 refills | Status: DC

## 2020-03-21 MED FILL — FLUTICASONE PROP 50 MCG SPR: 50 | 30 days supply | Qty: 16 | Fill #0

## 2020-03-21 MED FILL — ?CETIRIZINE HCL 10 MG TABLE: 10 | 30 days supply | Qty: 30 | Fill #2

## 2020-03-21 MED FILL — ?FAMOTIDINE 20MG TABLET: 20 | 30 days supply | Qty: 60 | Fill #0

## 2020-03-21 MED FILL — MELOXICAM 7.5 MG TABLET: 7.5 | 30 days supply | Qty: 30 | Fill #0

## 2020-03-21 MED FILL — HYDROCHLOROTHIAZIDE 25 MG T: 25 | 30 days supply | Qty: 30 | Fill #0

## 2020-03-21 MED FILL — METFORMIN HCL 500 MG TABS: 500 | 30 days supply | Qty: 30 | Fill #0
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
## 2020-03-21 NOTE — Progress Notes (Signed)
U/S scheduled for January 14th @ 1530.  Pt notified with Spanish Interpreter Eda R.   Leonette Nutting
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
## 2020-03-21 NOTE — Progress Notes (Signed)
States that she was informed to come her and get checked out for a hernia.  Pt needs refills on medications.

## 2020-03-21 NOTE — Progress Notes (Signed)
PHQ-9 and GAD-7 positive today; pt reports SI and thoughts of harm on PHQ-9 screening. Pt denies having any plan for harm today. States she wants to be here to take care of her children. Offered [facility] services. Pt is tearful while talking about social stressors and her mental health; however, pt declines services today. Encouraged pt to follow up with office if she would like to be seen by [facility]. Interpreter Eda present for this encounter.  Fleet Contras RN 03/21/20

## 2020-03-21 NOTE — Patient Instructions (Signed)
Dolor plvico en la mujer Pelvic Pain, Female El dolor plvico se siente en la parte inferior del vientre (abdomen), debajo del ombligo y a nivel de las caderas. El dolor puede comenzar en forma repentina (ser agudo), reaparecer (ser recurrente) o durar mucho tiempo (volverse crnico). El dolor plvico que dura ms de 6 meses se denomina dolor plvico crnico. El dolor plvico puede tener muchas causas. A veces, la causa del dolor plvico no se conoce. Siga estas indicaciones en su casa:   Tome los medicamentos de venta libre y los recetados solamente como se lo haya indicado el mdico.  Haga reposo como se lo haya indicado el mdico.  No tenga relaciones sexuales si siente dolor.  Lleve un registro del dolor plvico. Escriba los siguientes datos: ? Cundo comenz el dolor. ? La ubicacin del dolor. ? Qu parece mejorar o empeorar el dolor, como alimentos o el perodo (ciclo menstrual). ? Cualquier sntoma que se presente junto con el dolor.  Concurra a todas las visitas de control como se lo haya indicado el mdico. Esto es importante. Comunquese con un mdico si:  Los medicamentos no le alivian el dolor.  El dolor regresa.  Aparecen nuevos sntomas.  Tiene sangrado o secrecin inusual de la vagina.  Tiene fiebre o escalofros.  Tiene dificultad para defecar (estreimiento).  Observa sangre en el pis (orina) o en la materia fecal (heces).  El pis tiene mal olor.  Se siente dbil o siente que va a desvanecerse. Solicite ayuda inmediatamente si:  Siente un dolor repentino y muy intenso.  El dolor es cada vez peor.  Siente un dolor muy intenso y tambin tiene alguno de estos sntomas: ? Fiebre. ? Malestar estomacal (nuseas). ? Vmitos. ? Tiene mucho sudor.  Se desmaya (pierde el conocimiento). Resumen  El dolor plvico se siente en la parte inferior del vientre (abdomen), debajo del ombligo y a nivel de las caderas.  Hay muchas causas posibles del dolor  plvico.  Lleve un registro del dolor plvico. Esta informacin no tiene como fin reemplazar el consejo del mdico. Asegrese de hacerle al mdico cualquier pregunta que tenga. Document Revised: 11/19/2017 Document Reviewed: 11/19/2017 Elsevier Patient Education  2020 Elsevier Inc.  

## 2020-03-22 LAB — BASIC METABOLIC PANEL
BUN/Creatinine Ratio: 23 (ref 9–23)
BUN: 15 mg/dL (ref 6–20)
CO2: 25 mmol/L (ref 20–29)
Calcium: 9 mg/dL (ref 8.7–10.2)
Chloride: 106 mmol/L (ref 96–106)
Creatinine, Ser: 0.64 mg/dL (ref 0.57–1.00)
GFR calc Af Amer: 133 mL/min/{1.73_m2} (ref >59)
GFR calc non Af Amer: 115 mL/min/{1.73_m2} (ref >59)
Glucose: 88 mg/dL (ref 65–99)
Potassium: 3.9 mmol/L (ref 3.5–5.2)
Sodium: 143 mmol/L (ref 134–144)

## 2020-03-23 ENCOUNTER — Telehealth: Payer: Self-pay

## 2020-03-23 NOTE — Telephone Encounter (Signed)
-----   Message from Enobong Newlin, MD sent at 03/22/2020  2:27 PM EST ----- Please inform the patient that labs are normal. Thank you. 

## 2020-03-23 NOTE — Telephone Encounter (Signed)
Patient name and DOB has been verified Patient was informed of lab results. Patient had no questions.  

## 2020-03-27 ENCOUNTER — Ambulatory Visit
Admission: RE | Admit: 2020-03-27 | Discharge: 2020-03-27 | Disposition: A | Discharge status: Home / Self Care | Payer: Self-pay | Source: Ambulatory Visit | Attending: Obstetrics & Gynecology | Admitting: Obstetrics & Gynecology

## 2020-03-27 ENCOUNTER — Other Ambulatory Visit: Payer: Self-pay

## 2020-03-27 DIAGNOSIS — R102 Pelvic and perineal pain: Secondary | ICD-10-CM | POA: Insufficient documentation

## 2020-04-09 ENCOUNTER — Telehealth: Payer: Self-pay | Admitting: *Deleted

## 2020-04-09 NOTE — Telephone Encounter (Addendum)
-----   Message from Adam Phenix, MD sent at 04/09/2020  8:38 AM EST ----- Left adnexal mass is resolved; new smaller right sided cyst needs no f/u at this time  12/27  1127 Called pt w/[employer] interpreter # [phone] and she did not answer. A message was left by the interpreter stating that a nurse is calling to inform her of test results There is no emergency and we will call back another time.

## 2020-04-09 NOTE — Progress Notes (Signed)
Left adnexal mass is resolved; new smaller right sided cyst needs no f/u at this time

## 2020-04-10 ENCOUNTER — Other Ambulatory Visit: Payer: Self-pay | Admitting: Physician Assistant

## 2020-04-10 ENCOUNTER — Other Ambulatory Visit: Payer: Self-pay

## 2020-04-10 ENCOUNTER — Ambulatory Visit: Payer: Self-pay | Admitting: Physician Assistant

## 2020-04-10 ENCOUNTER — Telehealth: Payer: Self-pay | Admitting: Family Medicine

## 2020-04-10 VITALS — BP 123/66 | HR 80 | Temp 98.2°F | Resp 18 | Ht 62.0 in | Wt 360.0 lb

## 2020-04-10 DIAGNOSIS — Z6841 Body Mass Index (BMI) 40.0 and over, adult: Secondary | ICD-10-CM

## 2020-04-10 DIAGNOSIS — K429 Umbilical hernia without obstruction or gangrene: Secondary | ICD-10-CM

## 2020-04-10 MED ORDER — ACETAMINOPHEN-CODEINE #3 300-30 MG PO TABS: 1.0000 | ORAL_TABLET | ORAL | 0 refills | Status: DC | PRN

## 2020-04-10 NOTE — Progress Notes (Signed)
Patient complains of abdominal pain scaled at 10+. Patient complains of umbilical hernia that she can feel in the belly button. Patient has not eaten today. Patient complains of "feeling like Im going to burst" Pain has been present 1 year with pain increasing.
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
## 2020-04-10 NOTE — Patient Instructions (Addendum)
I encourage you to continue using the ibuprofen for your pain, you can use the Tylenol 3 for severe pain.  I encourage you to take a stool softener over-the-counter to help make it easier to have bowel movements and avoid abdominal pain.  I started a referral for you to be seen by general surgery, please let us know if you do not hear from them in the next few days.  I hope that you feel better soon  Roney Jaffe, PA-C Physician Assistant [facility] [email or web address]   Hernia umbilical en los adultos Umbilical Hernia, Adult  Una hernia es un bulto de tejido que sobresale a travs de una abertura [employer]. Una hernia umbilical se produce en el abdomen, cerca del ombligo. La hernia puede contener tejidos del intestino delgado, el intestino grueso o tejido graso que recubre el intestino (epipln). Las hernias USAA en los adultos suelen empeorar con el tiempo y requieren tratamiento quirrgico. Hay varios tipos de hernias umbilicales. Es posible que tenga lo siguiente:  Una hernia ubicada justo por debajo o por arriba del ombligo (hernia indirecta). Es el tipo de hernia umbilical ms frecuente en los adultos.  Una hernia que se forma a travs de una abertura hecha por el ombligo (hernia directa).  Una hernia que aparece y desaparece (hernia reducible). Una hernia reducible puede ser visible solo al hacer fuerza, levantar objetos pesados o toser. Este tipo de hernia se puede reintroducir en el abdomen (reducir).  Una hernia que aprisiona tejido abdominal (hernia encarcelada). Este tipo de hernia es irreducible.  Una hernia que interrumpe el flujo de sangre a los tejidos en su interior (hernia estrangulada). Si esto ocurre, los tejidos Mining engineer a Furniture conservator/restorer. Este tipo de hernia requiere tratamiento de Associate Professor. Cules son las causas? Una hernia umbilical se produce cuando el tejido dentro del abdomen ejerce presin  sobre una zona debilitada de los msculos abdominales. Qu incrementa el riesgo? Puede correr un mayor riesgo de Warehouse manager esta afeccin en los siguientes casos:  Tiene obesidad.  Tuvo varios embarazos.  Tiene una acumulacin de lquido dentro del abdomen (ascitis).  Se someti a una ciruga que [employer] abdominales. Cules son los signos o sntomas? El principal sntoma de esta afeccin es un bulto en el ombligo o cerca de este que no causa dolor. Una hernia reducible puede ser visible solo al hacer fuerza, levantar objetos pesados o toser. Otros sntomas pueden incluir lo siguiente:  Dolor sordo.  Sensacin de opresin. Los sntomas de una hernia estrangulada pueden incluir los siguientes:  Dolor que se vuelve cada vez ms intenso.  Nuseas y vmitos.  Dolor al ejercer presin sobre la hernia.  Cambio de color de la piel que recubre la hernia que se torna roja o violcea.  Estreimiento.  Sangre en las heces. Cmo se diagnostica? Esta afeccin se puede diagnosticar en funcin de lo siguiente:  Un examen fsico. Pueden pedirle que tosa o que haga fuerza mientras est de pie. Estas acciones aumentan la presin dentro del abdomen y empujan la hernia a travs de la abertura en los msculos. El mdico puede ejercer presin sobre la hernia para tratar de reducirla.  Los sntomas y los antecedentes mdicos. Cmo se trata? La ciruga es el nico tratamiento para una hernia umbilical. En el caso de que la hernia est estrangulada, esta se realiza lo antes posible. Si tiene una hernia pequea que no est encarcelada, tal vez tenga que bajar de peso antes de la [place]. Siga  estas indicaciones en su casa:  Baje de peso, si se lo indic el mdico.  No trate de reintroducir la hernia.  Observe si la hernia cambia de color o de tamao. Infrmele al mdico si se producen cambios.  Tal vez deba evitar las actividades que aumentan la presin sobre la hernia.  No levante  objetos que pesen ms de 10libras (4.5kg) hasta que el mdico le diga que es seguro.  Tome los medicamentos de venta libre y los recetados solamente como se lo haya indicado el mdico.  Oceanographer a todas las visitas de control como se lo haya indicado el mdico. Esto es importante. Comunquese con un mdico si:  La hernia se agranda.  La hernia le causa dolor. Solicite ayuda de inmediato si:  Tiene un dolor intenso y repentino cerca de la zona de la hernia.  Tiene dolor, as como nuseas o vmitos.  Tiene dolor y la piel que recubre la hernia cambia de color.  Presenta fiebre. Esta informacin no tiene Theme park manager el consejo del mdico. Asegrese de hacerle al mdico cualquier pregunta que tenga. Document Revised: 07/13/2017 Document Reviewed: 01/05/2017 Elsevier Patient Education  2020 [employer].

## 2020-04-10 NOTE — Telephone Encounter (Signed)
Called patient with assistance of [address] [facility] # [id number].   Patient informed of results. Patient reports she is still feeling pain. She reports it is always hurting on the lower side of her belly. It hurts from side to side and left to right. It is starting to hurt more on the right side.   She has Ibuprofen 600 mg for the pain, she is taking it about 3-4 times a day.   She reports her belly button is pushing out. She asked about a hernia, reviewed notes do indicate she has an umbilical hernia.   Patient reports she is having difficulty with stooling. She reports her stomach gets swollen and has pain. She has discussed with her PCP, per patient. She reports she has told them several times about her bowel movements. She is eating oatmeal smoothies with Almond Milk and blackberries/mixed berries and is stooling once a day usually, the pain does not go away after stooling. She is eating apples, celery and cucumbers also for fiber.   She reports she drinks less that 6 glasses of water today as she cannot go to the bathroom often at work. She reports her BP medications cause her to have to void a lot. Reviewed importance of drinking at least 64 ounces of fluid daily for bowel to work appropriately.   She is walking about 10-15 minutes daily.   Advised patient to call PCP about recommendations for bowel issues.   Patient was angry and cussing on the phone. She is planning to follow up with her PCP. Advised patient to call back if she worsens.

## 2020-04-10 NOTE — Telephone Encounter (Signed)
PT has severe stomach pain. Hernia had been found and Cysts wants treatment.

## 2020-04-10 NOTE — Telephone Encounter (Signed)
Pelvic ultrasound results are now in epic. Pt states that she is in a lot of pain.

## 2020-04-11 DIAGNOSIS — K429 Umbilical hernia without obstruction or gangrene: Secondary | ICD-10-CM | POA: Insufficient documentation

## 2020-04-11 MED FILL — ACETAMINOPHEN-COD #3 TABLET: 300-30 | 5 days supply | Qty: 20 | Fill #0

## 2020-04-11 NOTE — Telephone Encounter (Signed)
She had a visit with the mobile unit yesterday and referral to general surgery was placed.  If she has severe pain, nausea or vomiting she will need to go to the ED. she has a right ovarian cyst for which I had referred her to GYN who performed the ultrasound which again confirmed the presence of a cyst.  I would recommend she reach out to her GYN as well if she is in pain.

## 2020-04-12 ENCOUNTER — Emergency Department (HOSPITAL_COMMUNITY)
Admission: EM | Admit: 2020-04-12 | Discharge: 2020-04-12 | Disposition: A | Discharge status: Home / Self Care | Payer: Self-pay | Attending: Emergency Medicine | Admitting: Emergency Medicine

## 2020-04-12 ENCOUNTER — Encounter (HOSPITAL_COMMUNITY): Payer: Self-pay | Admitting: Emergency Medicine

## 2020-04-12 DIAGNOSIS — Z5321 Procedure and treatment not carried out due to patient leaving prior to being seen by health care provider: Secondary | ICD-10-CM | POA: Insufficient documentation

## 2020-04-12 DIAGNOSIS — R109 Unspecified abdominal pain: Secondary | ICD-10-CM | POA: Insufficient documentation

## 2020-04-12 DIAGNOSIS — R1110 Vomiting, unspecified: Secondary | ICD-10-CM | POA: Insufficient documentation

## 2020-04-12 LAB — I-STAT BETA HCG BLOOD, ED (MC, WL, AP ONLY): I-stat hCG, quantitative: <5 m[IU]/mL (ref <5)

## 2020-04-12 LAB — COMPREHENSIVE METABOLIC PANEL
ALT: 24 U/L (ref 0–44)
AST: 24 U/L (ref 15–41)
Albumin: 3.3 g/dL — ABNORMAL LOW (ref 3.5–5.0)
Alkaline Phosphatase: 48 U/L (ref 38–126)
Anion gap: 8 (ref 5–15)
BUN: 10 mg/dL (ref 6–20)
CO2: 24 mmol/L (ref 22–32)
Calcium: 8.1 mg/dL — ABNORMAL LOW (ref 8.9–10.3)
Chloride: 105 mmol/L (ref 98–111)
Creatinine, Ser: 0.57 mg/dL (ref 0.44–1.00)
GFR, Estimated: >60 mL/min (ref >60)
Glucose, Bld: 98 mg/dL (ref 70–99)
Potassium: 3.6 mmol/L (ref 3.5–5.1)
Sodium: 137 mmol/L (ref 135–145)
Total Bilirubin: 0.1 mg/dL — ABNORMAL LOW (ref 0.3–1.2)
Total Protein: 6.3 g/dL — ABNORMAL LOW (ref 6.5–8.1)

## 2020-04-12 LAB — CBC
HCT: 37.7 % (ref 36.0–46.0)
Hemoglobin: 12.3 g/dL (ref 12.0–15.0)
MCH: 30.2 pg (ref 26.0–34.0)
MCHC: 32.6 g/dL (ref 30.0–36.0)
MCV: 92.6 fL (ref 80.0–100.0)
Platelets: 267 10*3/uL (ref 150–400)
RBC: 4.07 MIL/uL (ref 3.87–5.11)
RDW: 12.6 % (ref 11.5–15.5)
WBC: 7.1 10*3/uL (ref 4.0–10.5)
nRBC: 0 % (ref 0.0–0.2)

## 2020-04-12 LAB — LIPASE, BLOOD: Lipase: 32 U/L (ref 11–51)

## 2020-04-12 NOTE — Telephone Encounter (Signed)
Pt was called and a VM was left informing pt that she needs to reach out to her OBGYN regarding her pain. She was informed that gerneral surgery referral was placed for hernia.

## 2020-04-12 NOTE — ED Triage Notes (Signed)
Pt reports recently diagnosed with hernia, c/o difficulty eating and vomiting. Supposed to have surgery but unsure when.

## 2020-04-16 ENCOUNTER — Encounter: Payer: Self-pay | Admitting: Surgery

## 2020-04-16 ENCOUNTER — Other Ambulatory Visit: Payer: Self-pay

## 2020-04-16 ENCOUNTER — Ambulatory Visit (INDEPENDENT_AMBULATORY_CARE_PROVIDER_SITE_OTHER): Payer: Self-pay | Admitting: Surgery

## 2020-04-16 VITALS — BP 137/80 | HR 72 | Temp 97.8°F | Ht 62.0 in | Wt 362.0 lb

## 2020-04-16 DIAGNOSIS — K429 Umbilical hernia without obstruction or gangrene: Secondary | ICD-10-CM

## 2020-04-16 NOTE — Patient Instructions (Addendum)
nuestro programador de Financial risk analyst lo Freight forwarder. tenga su hoja azul lista cuando ella lo llame. tenga su hoja azul lista cuando ella lo llame.  debe esperar estar sin trabajo durante 1-2 semanas con restricciones de levantamiento durante un total de 6 semanas despus de la ciruga.  debe obtener una faja abdominal para ayudar con el dolor. puedes conseguirlo en [place] tienda de suministros [employer], [address], [place], [place] [address], [phone] [facility], [address] [place], [place] [address], [phone] [facility], [address] [place], [place], [place] [address], [phone]   May take Miralax for constipation. Take one capful in a full glass of liquid. You may take twice a day.  Polyethylene Glycol powder O que  este medicamento? O POLIETILENOGLICOL 3350 em p  um laxante usado para tratar a constipao. Aumenta o teor de [employer]. A evacuao fica mais fcil e ocorre com maior frequncia. Este medicamento pode ser usado para outros propsitos; em caso de dvidas, pergunte ao seu profissional de sade ou farmacutico. NOMES DE MARCAS COMUNS: GaviLax, GIALAX, GlycoLax, Healthylax, MiraLax, Smooth LAX, Vita Health O que devo dizer a meu profissional de sade antes de tomar este medicamento? Precisam saber se voc tem algum dos seguintes problemas ou estados de sade:  histria pregressa de obstruo estomacal ou intestinal  distenso ou dor abdominal  dificuldade para engolir  diverticulite, colite ulcerativa ou outra doena crnica do intestino  fenilcetonria  reao estranha ou alergia ao polietilenoglicol  reao estranha ou alergia a outros medicamentos, corantes ou conservantes  est grvida ou tentando Dance movement psychotherapist  est amamentando Como devo usar este medicamento? Tome [employer] por via oral. O frasco possui uma tampa medidora marcada com uma linha. Encha a tampa com o p at a [employer] (a dose  equivale a aproximadamente 1 colher de sopa bem cheia). Coloque o p da tampa em um copo cheio (4 a 8 onas, ou 120 a 240 ml) de gua, suco, refrigerante, caf ou ch. Misture bem. Certifique-se de que o p dissolveu completamente. No beba se houver grumos. Beba a soluo. Tome [employer] indicado. No tome este medicamento com frequncia maior do que a indicada. Fale com seu pediatra a respeito do uso deste medicamento em crianas. Pode ser preciso tomar alguns cuidados especiais. Superdosagem: Se achar que tomou uma superdosagem deste medicamento, entre em contato imediatamente com o [facility] Intoxicaes ou v a [employer] [facility]. OBSERVAO: Este medicamento  s para voc. No compartilhe este medicamento com outras pessoas. E se eu deixar de tomar uma dose? Se perder uma dose, tome-a assim que possvel. Se j estiver quase na hora da sua prxima dose, tome somente essa dose. No tome o remdio em dobro, nem tome uma dose adicional. O que pode interagir com este medicamento? No se espera nenhuma interao. Esta lista pode no descrever todas as interaes possveis. D ao seu profissional de sade uma lista de todos os medicamentos, ervas medicinais, remdios de venda livre, ou suplementos alimentares que voc [place]. Diga tambm se voc fuma, bebe, ou [place] drogas ilcitas. Alguns destes podem interagir com o seu medicamento. Ao que devo ficar atento quando estiver [employer] medicamento? No use por mais de 2 semanas sem orientao do seu mdico ou profissional de sade. Pode demorar de 2 a 4 dias at [employer] e obter uma melhora na priso de ventre. Havendo qualquer mudana nos seus hbitos intestinais (inclusive constipao) que seja grave ou dure  mais de 3 semanas, consulte seu mdico ou profissional de sade. Sempre tome este medicamento com bastante gua. Que efeitos colaterais posso sentir aps usar este medicamento? Efeitos colaterais que devem ser  informados ao seu mdico ou profissional de sade o mais rpido possvel:  diarreia  dificuldade para respirar  erupo na pele, coceira, urticria  inchao, dor ou distenso grave do estmago  vmitos Efeitos colaterais que normalmente no precisam de cuidados mdicos (avise ao seu mdico ou profissional de sade se persistirem ou forem incmodos):  inchao ou gases estomacais  desconforto ou clicas na parte inferior do abdome  enjoo Esta lista pode no descrever todos os efeitos colaterais possveis. Para mais orientaes sobre efeitos colaterais, consulte o seu mdico. Voc pode relatar a ocorrncia de efeitos colaterais  FDA pelo telefone [phone].
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
## 2020-04-16 NOTE — Progress Notes (Signed)
Request for Medical Clearance has been faxed to Dr Odette Horns Newlin's office [facility]: [phone] Fax: [phone]
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
## 2020-04-17 ENCOUNTER — Telehealth: Payer: Self-pay | Admitting: Surgery

## 2020-04-17 NOTE — Telephone Encounter (Signed)
Outgoing call is made, left message for patient to call.  Please advise patient of Pre-Admission date/time, COVID Testing date and Surgery date.  Surgery Date: 04/24/20 Preadmission Testing Date: 04/20/20 (phone 8a-1p) Covid Testing Date: 04/23/20 - patient [facility] [facility]) between 8a-1p   Also patient needs to call [phone], between 1-3:00pm the day before surgery, to find out what time to arrive for surgery.

## 2020-04-17 NOTE — Telephone Encounter (Signed)
Incoming call from the patient, she is informed of all dates regarding her surgery and voices understanding.  Patient spoke and understood english well.

## 2020-04-19 ENCOUNTER — Telehealth: Payer: Self-pay

## 2020-04-19 NOTE — Telephone Encounter (Signed)
Paperwork received from [facility]. They are needing patient to be cleared for Hernia repair surgery. Her surgery date is 04/24/20.   If you write a letter I can print it out and fax it over before her surgery.

## 2020-04-20 ENCOUNTER — Other Ambulatory Visit
Admission: RE | Admit: 2020-04-20 | Discharge: 2020-04-20 | Disposition: A | Discharge status: Home / Self Care | Payer: Self-pay | Source: Ambulatory Visit | Attending: Surgery | Admitting: Surgery

## 2020-04-20 ENCOUNTER — Encounter: Payer: Self-pay | Admitting: Surgery

## 2020-04-20 ENCOUNTER — Other Ambulatory Visit: Payer: Self-pay

## 2020-04-20 NOTE — Telephone Encounter (Signed)
Letter is ready.  Thanks .

## 2020-04-20 NOTE — Telephone Encounter (Signed)
Letter has been faxed.

## 2020-04-20 NOTE — Patient Instructions (Signed)
INSTRUCTIONS FOR SURGERY     Your surgery is scheduled for:   Tuesday, April 24, 2020     To find out your arrival time for the day of surgery,          please call [phone] between 1 pm and 3 pm on :   Monday, April 23, 2020     When you arrive for surgery, report to [facility]. Once Registration completed, they    Will send you to the second floor.  Check in at the Surgery Desk.     REMEMBER: Instructions that are not followed completely may result in serious medical risk,  up to and including death, or upon the discretion of your surgeon and anesthesiologist,            your surgery may need to be rescheduled.  __X__ 1. Do not eat food after midnight the night before your procedure.                    No gum, candy, lozenger, tic tacs, tums or hard candies.                  ABSOLUTELY NOTHING SOLID IN YOUR MOUTH AFTER MIDNIGHT                    You may drink unlimited clear liquids up to 2 hours before you are scheduled to arrive for surgery.                   Do not drink anything within those 2 hours unless you need to take medicine, then take the                   smallest amount you need.  Clear liquids include:  water, apple juice without pulp,                   any flavor Gatorade, Black coffee, black tea.  Sugar may be added but no dairy/ honey /lemon.                        Broth and jello is not considered a clear liquid.  __x__  2. On the morning of surgery, please brush your teeth with toothpaste and water. You may rinse with                  mouthwash if you wish but DO NOT SWALLOW TOOTHPASTE OR MOUTHWASH  __X___3. NO alcohol for 24 hours before or after surgery.  __x___ 4.  Do NOT smoke or use e-cigarettes for 24 HOURS PRIOR TO SURGERY.                      DO NOT Use any chewable tobacco products for at least 6 hours prior to surgery.  __x___ 5. If you start any new medication after this  appointment and prior to surgery, please                   Bring it with you on the day of surgery.  ___x__ 6. Notify your doctor  if there is any change in your medical condition, such as fever,                  infection, vomitting,diarrhea or any open sores.  __x___ 7.  USE the CHG SOAP as instructed, the night before surgery and the day of surgery.                   Once you have washed with this soap, do NOT use any of the following: Powders, perfumes                    or lotions. Please do not wear make up, hairpins, clips or nail polish. You may wear deodorant.                   Men may shave their face and neck.  Women need to shave 48 hours prior to surgery.                   DO NOT wear ANY jewelry on the day of surgery. If there are rings that are too tight to                    remove easily, please address this prior to the surgery day. Piercings need to be removed.                                                                     NO METAL ON YOUR BODY.                    Do NOT bring any valuables.  If you came to Pre-Admit testing then you will not need license,                     insurance card or credit card.  If you will be staying overnight, please either leave your things in                     the car or have your family be responsible for these items.                     [facility] IS NOT RESPONSIBLE FOR BELONGINGS OR VALUABLES.  ___X__ 8. DO NOT wear contact lenses on surgery day.  You may not have dentures,                     Hearing aides, contacts or glasses in the operating room. These items can be                    Placed in the Recovery Room to receive immediately after surgery.  __x___ 9. IF YOU ARE SCHEDULED TO GO HOME ON THE SAME DAY, YOU MUST                   Have someone to drive you home and to stay with you  for the first 24 hours.                    Have an arrangement prior to arriving on surgery day.  ___x__ 10. Take  the following medications on  the morning of surgery with a sip of water:                              1. PEPCID                     2. ZYRTEC                     3. FLONASE                     4.  _____ 11.  Follow any instructions provided to you by your surgeon.                        Such as enema, clear liquid bowel prep  __X__  12. STOP ALL ASPIRIN PRODUCTS AS OF January 7TH                       THIS INCLUDES BC POWDERS / GOODIES POWDER  __XX___ 13. STOP Anti-inflammatories as of January 7TH                      This includes IBUPROFEN / MOTRIN / ADVIL / ALEVE/ NAPROXYN /                         MELOXICAM ALSO                    YOU MAY TAKE TYLENOL ANY TIME PRIOR TO SURGERY.  __X___ 14.  Stop supplements until after surgery.              __X____16.  Stop Metformin 2 full days prior to surgery.  Stop AFTER MORNING DOSE ON 04/21/20.                            Do NOT take any diabetes medications on surgery day.  __X____17.  Continue to take the following medications but do not take on the morning of surgery:                           HYDROCHLOROTHIAZIDE  __X____18.    Wear clean and comfortable clothing to the hospital.  MAKE SURE TO BRING CELL PHONE NUMBERS FOR ANY DRIVER OR CONTACT PERSON. HAVE STOOL SOFTENERS FOR USE AFTER SURGERY.  BEGIN TAKING IMMEDIATELY AFTER    SURGERY. YOU WILL NEED A PILLOW TO HOLD AGAINST YOUR BELLY IF YOU START LAUGHIING    OR COUGHING. THIS WILL HELP PUT PRESSURE AGAINST YOUR INCISIONS. INSTRUCTIONS FOR USING THE SOAP ARE INCLUDED IN THIS BAG.

## 2020-04-23 ENCOUNTER — Other Ambulatory Visit: Payer: Self-pay

## 2020-04-23 ENCOUNTER — Other Ambulatory Visit
Admission: RE | Admit: 2020-04-23 | Discharge: 2020-04-23 | Disposition: A | Discharge status: Home / Self Care | Payer: Self-pay | Source: Ambulatory Visit | Attending: Surgery | Admitting: Surgery

## 2020-04-23 DIAGNOSIS — I10 Essential (primary) hypertension: Secondary | ICD-10-CM | POA: Insufficient documentation

## 2020-04-23 DIAGNOSIS — Z6841 Body Mass Index (BMI) 40.0 and over, adult: Secondary | ICD-10-CM | POA: Insufficient documentation

## 2020-04-23 DIAGNOSIS — Z01818 Encounter for other preprocedural examination: Secondary | ICD-10-CM | POA: Insufficient documentation

## 2020-04-23 DIAGNOSIS — E669 Obesity, unspecified: Secondary | ICD-10-CM | POA: Insufficient documentation

## 2020-04-23 DIAGNOSIS — Z20822 Contact with and (suspected) exposure to covid-19: Secondary | ICD-10-CM | POA: Insufficient documentation

## 2020-04-23 LAB — SARS CORONAVIRUS 2 (TAT 6-24 HRS): SARS Coronavirus 2: NEGATIVE

## 2020-04-24 ENCOUNTER — Ambulatory Visit: Payer: Self-pay | Admitting: Urgent Care

## 2020-04-24 ENCOUNTER — Encounter
Admission: RE | Disposition: A | Discharge status: Home / Self Care | Payer: Self-pay | Source: Home / Self Care | Attending: Surgery

## 2020-04-24 ENCOUNTER — Ambulatory Visit
Admission: RE | Admit: 2020-04-24 | Discharge: 2020-04-24 | Disposition: A | Discharge status: Home / Self Care | Payer: Self-pay | Attending: Surgery | Admitting: Surgery

## 2020-04-24 ENCOUNTER — Encounter: Payer: Self-pay | Admitting: Surgery

## 2020-04-24 ENCOUNTER — Other Ambulatory Visit: Payer: Self-pay | Admitting: Surgery

## 2020-04-24 DIAGNOSIS — K429 Umbilical hernia without obstruction or gangrene: Secondary | ICD-10-CM | POA: Insufficient documentation

## 2020-04-24 DIAGNOSIS — Z79899 Other long term (current) drug therapy: Secondary | ICD-10-CM | POA: Insufficient documentation

## 2020-04-24 DIAGNOSIS — Z7984 Long term (current) use of oral hypoglycemic drugs: Secondary | ICD-10-CM | POA: Insufficient documentation

## 2020-04-24 DIAGNOSIS — Z791 Long term (current) use of non-steroidal anti-inflammatories (NSAID): Secondary | ICD-10-CM | POA: Insufficient documentation

## 2020-04-24 HISTORY — DX: Dyspnea, unspecified: R06.00

## 2020-04-24 HISTORY — PX: OTHER SURGICAL HISTORY: SHX169

## 2020-04-24 HISTORY — DX: Gastro-esophageal reflux disease without esophagitis: K21.9

## 2020-04-24 LAB — GLUCOSE, CAPILLARY
Glucose-Capillary: 90 mg/dL (ref 70–99)
Glucose-Capillary: 125 mg/dL — ABNORMAL HIGH (ref 70–99)

## 2020-04-24 LAB — POCT PREGNANCY, URINE: Preg Test, Ur: NEGATIVE

## 2020-04-24 SURGERY — REPAIR, HERNIA, UMBILICAL, ROBOT-ASSISTED
Anesthesia: General | Site: Abdomen

## 2020-04-24 MED ORDER — ONDANSETRON HCL 4 MG/2ML IJ SOLN
INTRAMUSCULAR | Status: DC | PRN
  Administered 2020-04-24: 4 mg via INTRAVENOUS

## 2020-04-24 MED ORDER — CHLORHEXIDINE GLUCONATE 0.12 % MT SOLN
OROMUCOSAL | Status: AC
  Filled 2020-04-24: qty 15

## 2020-04-24 MED ORDER — KETOROLAC TROMETHAMINE 30 MG/ML IJ SOLN
INTRAMUSCULAR | Status: DC | PRN
  Administered 2020-04-24: 30 mg via INTRAVENOUS

## 2020-04-24 MED ORDER — BUPIVACAINE LIPOSOME 1.3 % IJ SUSP
INTRAMUSCULAR | Status: DC | PRN
  Administered 2020-04-24: 20 mL

## 2020-04-24 MED ORDER — DEXTROSE 5 % IV SOLN
3.0000 g | INTRAVENOUS | Status: AC
  Administered 2020-04-24: 3 g via INTRAVENOUS
  Filled 2020-04-24: qty 3

## 2020-04-24 MED ORDER — ORAL CARE MOUTH RINSE: 15.0000 mL | Freq: Once | OROMUCOSAL | Status: AC

## 2020-04-24 MED ORDER — PROMETHAZINE HCL 25 MG/ML IJ SOLN
6.2500 mg | INTRAMUSCULAR | Status: DC | PRN
  Administered 2020-04-24: 6.25 mg via INTRAVENOUS

## 2020-04-24 MED ORDER — SUCCINYLCHOLINE CHLORIDE 20 MG/ML IJ SOLN
INTRAMUSCULAR | Status: DC | PRN
  Administered 2020-04-24: 170 mg via INTRAVENOUS

## 2020-04-24 MED ORDER — ACETAMINOPHEN 500 MG PO TABS
1000.0000 mg | ORAL_TABLET | ORAL | Status: AC
  Administered 2020-04-24: 1000 mg via ORAL

## 2020-04-24 MED ORDER — FENTANYL CITRATE (PF) 100 MCG/2ML IJ SOLN
INTRAMUSCULAR | Status: AC
  Filled 2020-04-24: qty 2

## 2020-04-24 MED ORDER — ACETAMINOPHEN 500 MG PO TABS: 1000.0000 mg | ORAL_TABLET | Freq: Four times a day (QID) | ORAL | Status: DC | PRN

## 2020-04-24 MED ORDER — BUPIVACAINE LIPOSOME 1.3 % IJ SUSP: 20.0000 mL | Freq: Once | INTRAMUSCULAR | Status: DC

## 2020-04-24 MED ORDER — ACETAMINOPHEN 500 MG PO TABS
ORAL_TABLET | ORAL | Status: AC
  Filled 2020-04-24: qty 1

## 2020-04-24 MED ORDER — ACETAMINOPHEN 500 MG PO TABS
1000.0000 mg | ORAL_TABLET | Freq: Four times a day (QID) | ORAL | Status: DC | PRN
  Administered 2020-04-24: 1000 mg via ORAL

## 2020-04-24 MED ORDER — PROPOFOL 10 MG/ML IV BOLUS
INTRAVENOUS | Status: DC | PRN
  Administered 2020-04-24: 200 mg via INTRAVENOUS

## 2020-04-24 MED ORDER — IBUPROFEN 800 MG PO TABS: 800.0000 mg | ORAL_TABLET | Freq: Three times a day (TID) | ORAL | 1 refills | Status: DC | PRN

## 2020-04-24 MED ORDER — ROCURONIUM BROMIDE 10 MG/ML (PF) SYRINGE
PREFILLED_SYRINGE | INTRAVENOUS | Status: AC
  Filled 2020-04-24: qty 10

## 2020-04-24 MED ORDER — GABAPENTIN 300 MG PO CAPS
300.0000 mg | ORAL_CAPSULE | ORAL | Status: AC
  Administered 2020-04-24: 300 mg via ORAL

## 2020-04-24 MED ORDER — CHLORHEXIDINE GLUCONATE 0.12 % MT SOLN
15.0000 mL | Freq: Once | OROMUCOSAL | Status: AC
  Administered 2020-04-24: 15 mL via OROMUCOSAL

## 2020-04-24 MED ORDER — ACETAMINOPHEN 500 MG PO TABS
ORAL_TABLET | ORAL | Status: AC
  Filled 2020-04-24: qty 2

## 2020-04-24 MED ORDER — SUGAMMADEX SODIUM 200 MG/2ML IV SOLN
INTRAVENOUS | Status: DC | PRN
  Administered 2020-04-24: 300 mg via INTRAVENOUS

## 2020-04-24 MED ORDER — FENTANYL CITRATE (PF) 100 MCG/2ML IJ SOLN
25.0000 ug | INTRAMUSCULAR | Status: DC | PRN
  Administered 2020-04-24: 50 ug via INTRAVENOUS

## 2020-04-24 MED ORDER — PHENYLEPHRINE HCL (PRESSORS) 10 MG/ML IV SOLN
INTRAVENOUS | Status: AC
  Filled 2020-04-24: qty 1

## 2020-04-24 MED ORDER — ONDANSETRON HCL 4 MG/2ML IJ SOLN
INTRAMUSCULAR | Status: AC
  Filled 2020-04-24: qty 2

## 2020-04-24 MED ORDER — CHLORHEXIDINE GLUCONATE CLOTH 2 % EX PADS
6.0000 | MEDICATED_PAD | Freq: Once | CUTANEOUS | Status: AC
  Administered 2020-04-24: 6 via TOPICAL

## 2020-04-24 MED ORDER — PROPOFOL 10 MG/ML IV BOLUS
INTRAVENOUS | Status: AC
  Filled 2020-04-24: qty 40

## 2020-04-24 MED ORDER — GABAPENTIN 300 MG PO CAPS
ORAL_CAPSULE | ORAL | Status: AC
  Filled 2020-04-24: qty 1

## 2020-04-24 MED ORDER — DEXAMETHASONE SODIUM PHOSPHATE 10 MG/ML IJ SOLN
INTRAMUSCULAR | Status: AC
  Filled 2020-04-24: qty 1

## 2020-04-24 MED ORDER — LIDOCAINE HCL (CARDIAC) PF 100 MG/5ML IV SOSY
PREFILLED_SYRINGE | INTRAVENOUS | Status: DC | PRN
  Administered 2020-04-24: 100 mg via INTRAVENOUS

## 2020-04-24 MED ORDER — ROCURONIUM BROMIDE 100 MG/10ML IV SOLN
INTRAVENOUS | Status: DC | PRN
  Administered 2020-04-24: 30 mg via INTRAVENOUS
  Administered 2020-04-24: 20 mg via INTRAVENOUS
  Administered 2020-04-24: 30 mg via INTRAVENOUS
  Administered 2020-04-24: 40 mg via INTRAVENOUS

## 2020-04-24 MED ORDER — SODIUM CHLORIDE 0.9 % IV SOLN: INTRAVENOUS | Status: DC

## 2020-04-24 MED ORDER — DEXAMETHASONE SODIUM PHOSPHATE 10 MG/ML IJ SOLN
INTRAMUSCULAR | Status: DC | PRN
  Administered 2020-04-24: 5 mg via INTRAVENOUS

## 2020-04-24 MED ORDER — MIDAZOLAM HCL 2 MG/2ML IJ SOLN
INTRAMUSCULAR | Status: AC
  Filled 2020-04-24: qty 2

## 2020-04-24 MED ORDER — BUPIVACAINE LIPOSOME 1.3 % IJ SUSP
INTRAMUSCULAR | Status: AC
  Filled 2020-04-24: qty 20

## 2020-04-24 MED ORDER — PROMETHAZINE HCL 25 MG/ML IJ SOLN
INTRAMUSCULAR | Status: AC
  Filled 2020-04-24: qty 1

## 2020-04-24 MED ORDER — OXYCODONE HCL 5 MG PO TABS: 5.0000 mg | ORAL_TABLET | ORAL | 0 refills | Status: DC | PRN

## 2020-04-24 MED ORDER — OXYCODONE HCL 5 MG PO TABS
ORAL_TABLET | ORAL | Status: AC
  Filled 2020-04-24: qty 1

## 2020-04-24 MED ORDER — BUPIVACAINE-EPINEPHRINE (PF) 0.25% -1:200000 IJ SOLN
INTRAMUSCULAR | Status: AC
  Filled 2020-04-24: qty 30

## 2020-04-24 MED ORDER — BUPIVACAINE-EPINEPHRINE 0.25% -1:200000 IJ SOLN
INTRAMUSCULAR | Status: DC | PRN
  Administered 2020-04-24: 30 mL

## 2020-04-24 MED ORDER — FENTANYL CITRATE (PF) 100 MCG/2ML IJ SOLN
INTRAMUSCULAR | Status: DC | PRN
  Administered 2020-04-24: 25 ug via INTRAVENOUS
  Administered 2020-04-24 (×2): 50 ug via INTRAVENOUS
  Administered 2020-04-24: 75 ug via INTRAVENOUS

## 2020-04-24 MED ORDER — OXYCODONE HCL 5 MG PO TABS
5.0000 mg | ORAL_TABLET | ORAL | Status: DC | PRN
  Administered 2020-04-24: 5 mg via ORAL

## 2020-04-24 MED ORDER — PHENYLEPHRINE HCL (PRESSORS) 10 MG/ML IV SOLN
INTRAVENOUS | Status: DC | PRN
  Administered 2020-04-24 (×2): 100 ug via INTRAVENOUS

## 2020-04-24 MED ORDER — MIDAZOLAM HCL 2 MG/2ML IJ SOLN
INTRAMUSCULAR | Status: DC | PRN
  Administered 2020-04-24: 2 mg via INTRAVENOUS

## 2020-04-24 SURGICAL SUPPLY — 61 items
"PENCIL ELECTRO HAND CTR " (MISCELLANEOUS) ×1 IMPLANT
ADH SKN CLS APL DERMABOND .7 (GAUZE/BANDAGES/DRESSINGS) ×1
APL PRP STRL LF DISP 70% ISPRP (MISCELLANEOUS) ×1
BINDER ABDOMINAL 12 ML 46-62 (SOFTGOODS) ×2 IMPLANT
BLADE SURG SZ11 CARB STEEL (BLADE) ×2 IMPLANT
CANISTER SUCT 1200ML W/VALVE (MISCELLANEOUS) ×1 IMPLANT
CANNULA REDUC XI 12-8 STAPL (CANNULA) ×1
CANNULA REDUCER 12-8 DVNC XI (CANNULA) ×1 IMPLANT
CHLORAPREP W/TINT 26 (MISCELLANEOUS) ×2 IMPLANT
COVER TIP SHEARS 8 DVNC (MISCELLANEOUS) ×1 IMPLANT
COVER TIP SHEARS 8MM DA VINCI (MISCELLANEOUS) ×1
COVER WAND RF STERILE (DRAPES) ×2 IMPLANT
DEFOGGER SCOPE WARMER CLEARIFY (MISCELLANEOUS) ×2 IMPLANT
DERMABOND ADVANCED (GAUZE/BANDAGES/DRESSINGS) ×1
DERMABOND ADVANCED .7 DNX12 (GAUZE/BANDAGES/DRESSINGS) ×1 IMPLANT
DRAPE ARM DVNC X/XI (DISPOSABLE) ×4 IMPLANT
DRAPE COLUMN DVNC XI (DISPOSABLE) ×1 IMPLANT
DRAPE DA VINCI XI ARM (DISPOSABLE) ×4
DRAPE DA VINCI XI COLUMN (DISPOSABLE) ×1
ELECT CAUTERY BLADE 6.4 (BLADE) ×2 IMPLANT
ELECT REM PT RETURN 9FT ADLT (ELECTROSURGICAL) ×2
ELECTRODE REM PT RTRN 9FT ADLT (ELECTROSURGICAL) ×1 IMPLANT
GLOVE SURG SYN 7.0 (GLOVE) ×4 IMPLANT
GLOVE SURG SYN 7.0 PF PI (GLOVE) ×2 IMPLANT
GLOVE SURG SYN 7.5  E (GLOVE) ×2
GLOVE SURG SYN 7.5 E (GLOVE) ×2 IMPLANT
GLOVE SURG SYN 7.5 PF PI (GLOVE) ×2 IMPLANT
GOWN STRL REUS W/ TWL LRG LVL3 (GOWN DISPOSABLE) ×3 IMPLANT
GOWN STRL REUS W/TWL LRG LVL3 (GOWN DISPOSABLE) ×6
GRASPER SUT TROCAR 14GX15 (MISCELLANEOUS) ×2 IMPLANT
IRRIGATION STRYKERFLOW (MISCELLANEOUS) IMPLANT
IRRIGATOR STRYKERFLOW (MISCELLANEOUS)
IV NS 1000ML (IV SOLUTION)
IV NS 1000ML BAXH (IV SOLUTION) IMPLANT
KIT PINK PAD W/HEAD ARE REST (MISCELLANEOUS) ×2
KIT PINK PAD W/HEAD ARM REST (MISCELLANEOUS) ×1 IMPLANT
LABEL OR SOLS (LABEL) ×2 IMPLANT
MANIFOLD NEPTUNE II (INSTRUMENTS) ×2 IMPLANT
MESH VENT LT ST 11.4CM CRL (Mesh General) ×1 IMPLANT
NDL INSUFFLATION 14GA 120MM (NEEDLE) ×1 IMPLANT
NEEDLE HYPO 22GX1.5 SAFETY (NEEDLE) ×2 IMPLANT
NEEDLE INSUFFLATION 14GA 120MM (NEEDLE) ×2 IMPLANT
OBTURATOR OPTICAL STANDARD 8MM (TROCAR) ×1
OBTURATOR OPTICAL STND 8 DVNC (TROCAR) ×1
OBTURATOR OPTICALSTD 8 DVNC (TROCAR) ×1 IMPLANT
PACK LAP CHOLECYSTECTOMY (MISCELLANEOUS) ×2 IMPLANT
PENCIL ELECTRO HAND CTR (MISCELLANEOUS) ×2 IMPLANT
SEAL CANN UNIV 5-8 DVNC XI (MISCELLANEOUS) ×2 IMPLANT
SEAL XI 5MM-8MM UNIVERSAL (MISCELLANEOUS) ×2
SET TUBE SMOKE EVAC HIGH FLOW (TUBING) ×2 IMPLANT
SOLUTION ELECTROLUBE (MISCELLANEOUS) ×2 IMPLANT
SPONGE LAP 18X18 RF (DISPOSABLE) ×2 IMPLANT
STAPLER CANNULA SEAL DVNC XI (STAPLE) ×1 IMPLANT
STAPLER CANNULA SEAL XI (STAPLE) ×1
SUT MNCRL 4-0 (SUTURE) ×2
SUT MNCRL 4-0 27XMFL (SUTURE) ×1
SUT STRATAFIX PDS 30 CT-1 (SUTURE) ×2 IMPLANT
SUT VICRYL 0 AB UR-6 (SUTURE) ×4 IMPLANT
SUT VLOC 90 2/L VL 12 GS22 (SUTURE) ×4 IMPLANT
SUTURE MNCRL 4-0 27XMF (SUTURE) ×1 IMPLANT
TRAY FOLEY SLVR 16FR LF STAT (SET/KITS/TRAYS/PACK) ×1 IMPLANT

## 2020-04-24 NOTE — Transfer of Care (Signed)
Immediate Anesthesia Transfer of Care Note  Patient: Stacie Mitchell  Procedure(s) Performed: XI ROBOT ASSISTED UMBILICAL HERNIA REPAIR (N/A Abdomen)  Patient Location: PACU  Anesthesia Type:General  Level of Consciousness: awake, alert  and oriented  Airway & Oxygen Therapy: Patient Spontanous Breathing and Patient connected to face mask oxygen  Post-op Assessment: Report given to RN and Post -op Vital signs reviewed and stable  Post vital signs: Reviewed and stable  Last Vitals:  Vitals Value Taken Time  BP    Temp    Pulse    Resp    SpO2      Last Pain:  Vitals:   04/24/20 0845  PainSc: 4       Patients Stated Pain Goal: 1 (04/20/20 0948)  Complications: No complications documented.

## 2020-04-24 NOTE — Anesthesia Postprocedure Evaluation (Signed)
Anesthesia Post Note  Patient: Stacie Mitchell  Procedure(s) Performed: XI ROBOT ASSISTED UMBILICAL HERNIA REPAIR (N/A Abdomen)  Patient location during evaluation: PACU Anesthesia Type: General Level of consciousness: awake and alert Pain management: pain level controlled Vital Signs Assessment: post-procedure vital signs reviewed and stable Respiratory status: spontaneous breathing, nonlabored ventilation, respiratory function stable and patient connected to nasal cannula oxygen Cardiovascular status: blood pressure returned to baseline and stable Postop Assessment: no apparent nausea or vomiting Anesthetic complications: no   No complications documented.   Last Vitals:  Vitals:   04/24/20 1350 04/24/20 1417  BP:  (P) 107/64  Pulse: 78 (P) 75  Resp: 15 (P) 16  Temp: (!) 36.1 C (!) (P) 36.2 C  SpO2: 98% (P) 99%    Last Pain:  Vitals:   04/24/20 1417  TempSrc: (P) Temporal  PainSc:                  Lenard Simmer

## 2020-04-24 NOTE — OR Nursing (Signed)
abd binder applied

## 2020-04-24 NOTE — Interval H&P Note (Signed)
History and Physical Interval Note:  04/24/2020 9:07 AM  Stacie Mitchell  has presented today for surgery, with the diagnosis of incarcerated umbilical hernia.  The various methods of treatment have been discussed with the patient and family. After consideration of risks, benefits and other options for treatment, the patient has consented to  Procedure(s): XI ROBOT ASSISTED UMBILICAL HERNIA REPAIR (N/A) as a surgical intervention.  The patient's history has been reviewed, patient examined, no change in status, stable for surgery.  I have reviewed the patient's chart and labs.  Questions were answered to the patient's satisfaction.     Ugonna Keirsey

## 2020-04-24 NOTE — Op Note (Signed)
  Procedure Date:  04/24/2020  Pre-operative Diagnosis:  Umbilical hernia  Post-operative Diagnosis:  Umbilical hernia  Procedure:  Robotic assisted Umbilical Hernia Repair with mesh  Surgeon:  Howie Ill, MD  Assistant:  Rayvon Char, PA-S  Anesthesia:  General endotracheal  Estimated Blood Loss:  10 ml  Specimens:  None  Complications:  None  Indications for Procedure:  This is a 35 y.o. female who presents with an umbilical hernia.  The options of surgery versus observation were reviewed with the patient and/or family.  Given her BMI, she would be a good candidate for robotic approach.  The risks of bleeding, abscess or infection, recurrence of symptoms, potential for an open procedure, injury to surrounding structures, and chronic pain were all discussed with the patient and was willing to proceed.  Description of Procedure: The patient was correctly identified in the preoperative area and brought into the operating room.  The patient was placed supine with VTE prophylaxis in place.  Appropriate time-outs were performed.  Anesthesia was induced and the patient was intubated.  Appropriate antibiotics were infused.  The abdomen was prepped and draped in a sterile fashion.  A Veress needle was introduced in the left upper quadrant and pneumoperitoneum was obtained with appropriate pressures.  An 8 mm robotic port was introduced in the left lateral position using Optivue technique.  Then a 12 mm port in the LUQ and an 8 mm port in the LLQ were introduced under direct visualization.  The DaVinci platform was docked, camera targeted, and instruments inserted under direct visualization.  The patient had an umbilical hernia containing omentum.  The omentum was reduced without complication.  The hernia sac was excised using cautery.  There was two other areas of adhesions of the omentum to the abdominal wall, which were taken down using cautery.  Then, the fascial edges of the  umbilical hernia were cleaned using cautery.  A 0 Stratafix, 2-0 v-lock, and a 4.5 inch Bard Ventralight ST Echo mesh were inserted through the 12 mm port.  The defect was closed using the Stratafix suture, and the mesh was positioned using PMI and the echo system.  The mesh was then sutured in place with two 2-0 v-lock sutures.  The mesh laid out without any kinks or gaps in the suture line.  The 12-mm port was removed and the fascia was closed under direct visualization utilizing an Endo Close technique with 0 Vicryl figure of eight suture.  50 ml of Exparel solution mixed with 0.5% bupivacaine with epi were infiltrated onto the 12 mm port fascia, the umbilical fascia, and the surrounding peritoneum.  The 8 mm ports were removed and pneumoperitoneum was released.  Local anesthetic was infused in all incisions and the incisions were closed with 4-0 Monocryl.  The wounds were cleaned and sealed with DermaBond.  The patient was emerged from anesthesia and extubated and brought to the recovery room for further management.  The patient tolerated the procedure well and all counts were correct at the end of the case.   Howie Ill, MD

## 2020-04-24 NOTE — OR Nursing (Signed)
Instructions given with [place] interpreter to [place].  She will arrnge for someone to pick up the patient.

## 2020-04-24 NOTE — Progress Notes (Signed)
Medical Clearance has been received from Dr Alvis Lemmings. The patient is cleared at low risk for surgery. Letter is in Epic.
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
## 2020-04-24 NOTE — OR Nursing (Signed)
Awaiting ride for d/c home

## 2020-04-24 NOTE — Discharge Instructions (Addendum)
CIRUGIA AMBULATORIA       Instruccionnes de alta    1.  Las drogas que se Dispensing optician en su cuerpo [employer], asi            que por las proximas 24 horas usted no debe:   Conducir Field seismologist) un automovil   Hacer ninguna decision legal   Tomar ninguna bebida alcoholica  2.  A) [place] puede comenzar una dieta regular.  Es mejor que hoy empiece con           liquidos y gradualmente anada [address]       B) Puede comer cualquier comida que desee pero es mejor empezar con liquidos,                      luego sopitas con galletas saladas y gradualmente llegar a las comidas solidas.  3.  Por favor avise a su medico inmediatamente si usted tiene algun sangrado anormal,       tiene dificultad con la respiracion, enrojecimiento y Engineer, mining en el sitio de la cirugia, [place],       fiebro o dolor que se alivia con [place].   5.  Istrucciones especificas :  AMBULATORY SURGERY  DISCHARGE INSTRUCTIONS   1) The drugs that you were given will stay in your system until tomorrow so for the next 24 hours you should not:  A) Drive an automobile B) Make any legal decisions C) Drink any alcoholic beverage   2) You may resume regular meals tomorrow.  Today it is better to start with liquids and gradually work up to solid foods.  You may eat anything you prefer, but it is better to start with liquids, then soup and crackers, and gradually work up to solid foods.   3) Please notify your doctor immediately if you have any unusual bleeding, trouble breathing, redness and pain at the surgery site, drainage, fever, or pain not relieved by medication.    4) Additional Instructions:  Do not remove green band for 4 days     Please contact your physician with any problems or Same Day Surgery at [phone], Monday through Friday 6 am to 4 pm, or [place] at [facility] number at [phone].

## 2020-04-24 NOTE — Anesthesia Procedure Notes (Signed)
Procedure Name: Intubation Date/Time: 04/24/2020 9:43 AM Performed by: Clyde Lundborg, CRNA Pre-anesthesia Checklist: Patient identified, Emergency Drugs available, Suction available and Patient being monitored Patient Re-evaluated:Patient Re-evaluated prior to induction Oxygen Delivery Method: Circle system utilized Preoxygenation: Pre-oxygenation with 100% oxygen Induction Type: IV induction Ventilation: Mask ventilation without difficulty Laryngoscope Size: McGraph and 3 Grade View: Grade I Tube type: Oral Tube size: 7.5 mm Number of attempts: 1 Airway Equipment and Method: Stylet and Oral airway Placement Confirmation: ETT inserted through vocal cords under direct vision,  positive ETCO2,  breath sounds checked- equal and bilateral and CO2 detector Secured at: 22 cm Tube secured with: Tape Dental Injury: Teeth and Oropharynx as per pre-operative assessment

## 2020-04-25 ENCOUNTER — Telehealth: Payer: Self-pay | Admitting: Surgery

## 2020-04-25 MED FILL — IBUPROFEN 800 MG TABLET: 800 | 20 days supply | Qty: 60 | Fill #0

## 2020-04-25 NOTE — Progress Notes (Signed)
Hospital interpretor loyda used for call

## 2020-04-25 NOTE — Telephone Encounter (Signed)
Spoke with Stacie Mitchell through interpreter services- tried reaching patient on all numbers listed in chart-left message to call office.

## 2020-04-25 NOTE — Telephone Encounter (Signed)
Patient called this morning.  She had umbilical hernia surgery on 04/24/20 with Dr Aleen Campi.  She is asking that her pain medication be sent to [facility] on [address] in [place].  She said that they have a pharmacy there.  Please call her when sent. Thank you.

## 2020-05-01 ENCOUNTER — Other Ambulatory Visit: Payer: Self-pay

## 2020-05-01 ENCOUNTER — Ambulatory Visit (INDEPENDENT_AMBULATORY_CARE_PROVIDER_SITE_OTHER): Payer: Self-pay | Admitting: Physician Assistant

## 2020-05-01 ENCOUNTER — Encounter: Payer: Self-pay | Admitting: Physician Assistant

## 2020-05-01 ENCOUNTER — Telehealth: Payer: Self-pay | Admitting: Family Medicine

## 2020-05-01 VITALS — BP 130/84 | HR 76 | Temp 98.6°F | Ht 62.0 in | Wt 365.4 lb

## 2020-05-01 DIAGNOSIS — K429 Umbilical hernia without obstruction or gangrene: Secondary | ICD-10-CM

## 2020-05-01 DIAGNOSIS — Z09 Encounter for follow-up examination after completed treatment for conditions other than malignant neoplasm: Secondary | ICD-10-CM

## 2020-05-01 MED ORDER — AMOXICILLIN-POT CLAVULANATE 875-125 MG PO TABS: 1.0000 | ORAL_TABLET | Freq: Two times a day (BID) | ORAL | 0 refills | Status: DC

## 2020-05-01 MED ORDER — OXYCODONE HCL 5 MG PO TABS
5.0000 mg | ORAL_TABLET | ORAL | 0 refills | Status: DC | PRN
Start: 2020-05-01 — End: 2020-05-09

## 2020-05-01 MED ORDER — OXYCODONE HCL 5 MG PO TABS: 5.0000 mg | ORAL_TABLET | ORAL | 0 refills | Status: DC | PRN

## 2020-05-01 MED FILL — ?AMOX-CLAV 875-125 MG TABL: 875-125 | 7 days supply | Qty: 14 | Fill #0

## 2020-05-01 NOTE — Telephone Encounter (Signed)
Copied from CRM [id number]. Topic: General - Other >> May 01, 2020 11:13 AM Stacie Mitchell A wrote: Reason for JKK:[id number] got upset because I could not schedule her appointment and used foul language towards me. Patient would like a call back from [place] to discuss scheduling an appointment to get an [employer]. Please advise   Patient is requesting some who speak spanish.

## 2020-05-01 NOTE — Telephone Encounter (Signed)
I return Pt, call, I inform the Pt to please don't used does type of language with the people that is trying to help her an also schedule a financial appt

## 2020-05-01 NOTE — Progress Notes (Signed)
[facility] POST-OP OFFICE VISIT  05/01/2020  HPI: Stacie Mitchell is a 35 y.o. female 7 days s/p robotic umbilical hernia repair with mesh with Dr Aleen Campi.   She presents today with concerns regarding incisional pain and erythema. She reports that she has had some discomfort in her abdomen post-operatively but nothing worse than expected. However, in the last 24-48 hours, she has noticed increasing pain, particularly in her most inferior laparoscopic incision site. This is accompanied by increased redness and drainage. She did have a picture on her phone of the drainage yesterday, and this appeared purulent in nature. She denied any fever, chills, nausea, or emesis. She is constipated and trying at home remedies. She is taking narcotic pain medication q4 hours which helps mildly with her worsening pain.    Vital signs: BP 130/84   Pulse 76   Temp 98.6 F (37 C) (Oral)   Ht 5\' 2"  (1.575 m)   Wt (!) 365 lb 6.4 oz (165.7 kg)   SpO2 99%   BMI 66.83 kg/m    Physical Exam: Constitutional: Well appearing female, NAD Abdomen: Abdomen is soft, obese, tenderness over incisions, worse in most inferior incision, difficult to appreciate distension, no rebound/guarding Skin: Her most inferior laparoscopic incision on her left abdomen is erythematous and tender to palpation, there is no active drainage but she does have a picture on her phone of what appears to be purulent drainage from yesterday. I am unable to appreciate any fluctuance or crepitus. The remaining 3 laparoscopic incisions are healing well without erythema or drainage   Assessment/Plan: This is a 35 y.o. female 7 days s/p robotic umbilical hernia repair with mesh with Dr 31   - I am concerning that her most inferior laparoscopic incision may be infected. I do not appreciate any abscess currently on examination. I will start her on Augmentin x7 days for this  - She understands if she develops fevers,  worsening erythema, r significant drainage to call the office  - I will refill her pain medication and encouraged her to use other forms of pain control (ex: tylenol, Motrin, Ice) and save narcotic pain medications for breakthrough pain or night time.   - I will have her rtc in 1 week for re-check and ensure no worsening of any possible wound infection. Again, she understands to return sooner should she fail to improve.   -- Aleen Campi, PA-C [facility] 05/01/2020, 1:26 PM [phone] M-F: 7am - 4pm

## 2020-05-01 NOTE — Patient Instructions (Addendum)
Please pick your prescriptions at your pharmacy. You can also take Ibuprofen, Tylenol, and Motrin as needed for pain. Continue taking Miralax to help with constipation. If you start having diarrhea, back off on the Miralax. See your follow up appointment below.

## 2020-05-08 MED FILL — FLUTICASONE PROP 50 MCG SPR: 50 | 30 days supply | Qty: 16 | Fill #1

## 2020-05-08 MED FILL — ?FAMOTIDINE 20MG TABLET: 20 | 30 days supply | Qty: 60 | Fill #1

## 2020-05-08 MED FILL — HYDROCHLOROTHIAZIDE 25 MG T: 25 | 30 days supply | Qty: 30 | Fill #1

## 2020-05-08 MED FILL — METFORMIN HCL 500 MG TABS: 500 | 30 days supply | Qty: 30 | Fill #1

## 2020-05-09 ENCOUNTER — Other Ambulatory Visit: Payer: Self-pay

## 2020-05-09 ENCOUNTER — Ambulatory Visit (INDEPENDENT_AMBULATORY_CARE_PROVIDER_SITE_OTHER): Payer: Self-pay | Admitting: Surgery

## 2020-05-09 ENCOUNTER — Other Ambulatory Visit: Payer: Self-pay | Admitting: Surgery

## 2020-05-09 ENCOUNTER — Encounter: Payer: Self-pay | Admitting: Surgery

## 2020-05-09 VITALS — BP 164/92 | HR 67 | Temp 98.5°F | Ht 62.0 in | Wt 357.0 lb

## 2020-05-09 DIAGNOSIS — Z09 Encounter for follow-up examination after completed treatment for conditions other than malignant neoplasm: Secondary | ICD-10-CM

## 2020-05-09 DIAGNOSIS — K429 Umbilical hernia without obstruction or gangrene: Secondary | ICD-10-CM

## 2020-05-09 MED ORDER — OXYCODONE HCL 5 MG PO TABS: 5.0000 mg | ORAL_TABLET | ORAL | 0 refills | Status: DC | PRN

## 2020-05-09 MED ORDER — AMOXICILLIN-POT CLAVULANATE 875-125 MG PO TABS: 1.0000 | ORAL_TABLET | Freq: Two times a day (BID) | ORAL | 0 refills | Status: DC

## 2020-05-09 MED FILL — AMOX-CLAV 875-125 MG TABLET: 875-125 | 7 days supply | Qty: 14 | Fill #0

## 2020-05-09 NOTE — Patient Instructions (Signed)
Please pick your prescription from your pharmacy. See follow up appointment below. If you have any concerns or questions, please feel free to call our office.

## 2020-05-09 NOTE — Progress Notes (Signed)
05/09/2020  HPI: Kenedee Molesky is a 35 y.o. female s/p robotic assisted umbilical hernia repair on 04/24/20.  She was seen last week on 1/18 due to pain in the LLQ incision, and was diagnosed with post-op infection and given Augmentin for 7 days.  Today, she reports that the pain has improved but is still very sensitive and tender.  She also has pain in the umbilical area and notices pulling sensation from pelvis to umbilicus when she stands up.  She's also having some left flank pain today.  Denies any drainage from the incisions.  Vital signs: BP (!) 164/92   Pulse 67   Temp 98.5 F (36.9 C) (Oral)   Ht 5\' 2"  (1.575 m)   Wt (!) 357 lb (161.9 kg)   SpO2 100%   BMI 65.30 kg/m    Physical Exam: Constitutional: No acute distress Abdomen:  Soft, morbidly obese, non-distended, with tenderness to palpation in LLQ incision.  No drainage or erythema.  No hernia recurrence evident at this time.  Assessment/Plan: This is a 35 y.o. female s/p robotic assisted umbilical hernia repair.  --Discussed with the patient that it appears the infection is resolving, but as a precaution, will give another course of Augmentin.  This will continue to improve. --With regards to the umbilical discomfort and pulling, discussed with her that this is related to the sutures and mesh to close the defect, and when she stands up, the weight from her pannus pulls down on that area, which causes the discomfort.  This will continue to improve as the healing process progresses.   --Follow up in 3 weeks.   31, MD [facility]

## 2020-05-11 ENCOUNTER — Other Ambulatory Visit: Payer: Self-pay

## 2020-05-11 ENCOUNTER — Ambulatory Visit: Payer: Self-pay | Attending: Family Medicine

## 2020-05-15 ENCOUNTER — Telehealth: Payer: Self-pay | Admitting: Family Medicine

## 2020-05-15 NOTE — Telephone Encounter (Signed)
Called patient to see if she is needing a dental referral. LVM to return call.    Copied from CRM [id number]. Topic: General - Other >> May 15, 2020 11:22 AM Jaquita Rector A wrote: Reason for CRM: Patient called in to say that she need a dentist and that she is home at the moment and would like to be seen so she wont loose time at work. Please call ph# [phone]

## 2020-05-16 ENCOUNTER — Ambulatory Visit: Payer: Self-pay | Admitting: *Deleted

## 2020-05-16 NOTE — Telephone Encounter (Signed)
Please place on Dr [facility] schedule. Thanks

## 2020-05-16 NOTE — Telephone Encounter (Addendum)
InterpreterLars Masson [phone] Request to PCP for ENT referrals patient states her medication is not helping. Patient declines appointment in office- she states that is not going to help. Patient states she went to ER and waited 6 hours before leaving. She is insistent on referral- she feels she has symptoms like her other family members who had cancer. Patient complains of trouble swallowing pills, throat is sore, feels irritated- patient has constant spitting.  Reason for Disposition . [1] Drooling or spitting out saliva (because can't swallow) AND [2] normal breathing  Answer Assessment - Initial Assessment Questions 1. ONSET: "When did the throat start hurting?" (Hours or days ago)      Over 1 year- not getting better with medication 2. SEVERITY: "How bad is the sore throat?" (Scale 1-10; mild, moderate or severe)   - MILD (1-3):  doesn't interfere with eating or normal activities   - MODERATE (4-7): interferes with eating some solids and normal activities   - SEVERE (8-10):  excruciating pain, interferes with most normal activities   - SEVERE DYSPHAGIA: can't swallow liquids, drooling     moderate 3. STREP EXPOSURE: "Has there been any exposure to strep within the past week?" If Yes, ask: "What type of contact occurred?"      no 4.  VIRAL SYMPTOMS: "Are there any symptoms of a cold, such as a runny nose, cough, hoarse voice or red eyes?"      no 5. FEVER: "Do you have a fever?" If Yes, ask: "What is your temperature, how was it measured, and when did it start?"     no 6. PUS ON THE TONSILS: "Is there pus on the tonsils in the back of your throat?"     no 7. OTHER SYMPTOMS: "Do you have any other symptoms?" (e.g., difficulty breathing, headache, rash)     Unable to swallow pills, burning up to nose causing headache. 8. PREGNANCY: "Is there any chance you are pregnant?" "When was your last menstrual period?"     no  Protocols used: SORE THROAT-A-AH

## 2020-05-16 NOTE — Telephone Encounter (Signed)
Pt will be mailed a dental listing.

## 2020-05-21 ENCOUNTER — Telehealth: Payer: Self-pay | Admitting: Family Medicine

## 2020-05-21 NOTE — Telephone Encounter (Signed)
Called patient using interpreter services and LVM advising patient to call back [phone] to schedule.

## 2020-05-21 NOTE — Telephone Encounter (Signed)
Attempted to call patient and LVM. Trying to get her scheduled with ENT that covers here at [facility].

## 2020-05-21 NOTE — Telephone Encounter (Signed)
Patient is calling to state that she does not need a dentist. Patient is requesting a referral for a throat doctor. Patient is having difficulty swallowing and swallowing pills. Patient is extremely upset that she can not be seen sooner that the end of February. Patient hung up on agent.

## 2020-05-21 NOTE — Telephone Encounter (Signed)
Called patient and LVM to schedule with ENT we have covering here Dr. Lazarus Salines. Advised patient to call back and schedule [phone]. If patient calls back please transfer to office to have her scheduled.

## 2020-06-01 ENCOUNTER — Encounter: Payer: Self-pay | Admitting: Surgery

## 2020-06-01 ENCOUNTER — Ambulatory Visit (INDEPENDENT_AMBULATORY_CARE_PROVIDER_SITE_OTHER): Payer: Self-pay | Admitting: Surgery

## 2020-06-01 ENCOUNTER — Other Ambulatory Visit: Payer: Self-pay

## 2020-06-01 VITALS — BP 154/83 | HR 98 | Temp 98.3°F | Ht 62.0 in | Wt 360.0 lb

## 2020-06-01 DIAGNOSIS — Z09 Encounter for follow-up examination after completed treatment for conditions other than malignant neoplasm: Secondary | ICD-10-CM

## 2020-06-01 DIAGNOSIS — K429 Umbilical hernia without obstruction or gangrene: Secondary | ICD-10-CM

## 2020-06-01 NOTE — Progress Notes (Signed)
06/01/2020  HPI: Stacie Mitchell is a 35 y.o. female s/p robotic assisted umbilical hernia repair on 04/24/20.  Patient presents for follow up.  She reports that she's still having some discomfort in the LLQ incision.  She was given an Augmentin course as a precaution on her last visit in case this could be a mild wound infection.  She reports that the pain has been improving slowly but is still there.  She also could feel a small nodule at the base of her umbilicus.  She also reports that she gets full more quickly and has had to decrease her meal size.  She's trying to lose weight.  Vital signs: BP (!) 154/83   Pulse 98   Temp 98.3 F (36.8 C) (Oral)   Ht 5\' 2"  (1.575 m)   Wt (!) 360 lb (163.3 kg)   SpO2 98%   BMI 65.84 kg/m    Physical Exam: Constitutional:  No acute distress Abdomen:  Soft, obese, non-distended, with some superficial tenderness to palpation over the LLQ incision, with less discomfort to deep palpation around it.  The other incisions are well healed.  There is a small 1.5 cm nodule at the base of the umbilicus, which could be trapped preperitoneal fat, but there is no evidence of hernia recurrence when she strains or coughs.  Assessment/Plan: This is a 35 y.o. female s/p robotic assisted umbilical hernia repair.  --Discussed with the patient that there is no evidence of hernia recurrence.  The LLQ incisional pain could be related to an irritated or entrapped cutaneous nerve.  This may improve with time as the scar tissue softens.  Recommended that she try Lidocaine patches that are over the counter to see if this can help.  There is no evidence of hernia recurrence. --May resume normal activities next week. --Follow up as needed.   31, MD [facility]

## 2020-06-01 NOTE — Patient Instructions (Addendum)
You can try Lidocaine patches 4% for your pain as needed.    If you have any concerns or questions, please feel free to call our office.

## 2020-06-04 ENCOUNTER — Telehealth: Payer: Self-pay | Admitting: *Deleted

## 2020-06-04 NOTE — Telephone Encounter (Signed)
Work note is done and up front for pt to pick up.

## 2020-06-04 NOTE — Telephone Encounter (Signed)
Patient called and wants a note that she is able to return to work on Monday 06/11/20. She is going to call back if she can get a fax number but if not she will come pick it up

## 2020-06-06 MED FILL — HYDROCHLOROTHIAZIDE 25 MG T: 25 | 30 days supply | Qty: 30 | Fill #2

## 2020-06-06 MED FILL — METFORMIN HCL 500 MG TABS: 500 | 30 days supply | Qty: 30 | Fill #2

## 2020-06-06 MED FILL — ?CETIRIZINE HCL 10 MG TABLE: 10 | 30 days supply | Qty: 30 | Fill #3

## 2020-06-06 MED FILL — ?FAMOTIDINE 20MG TABLET: 20 | 30 days supply | Qty: 60 | Fill #2

## 2020-06-06 MED FILL — FLUTICASONE PROP 50 MCG SPR: 50 | 30 days supply | Qty: 16 | Fill #2

## 2020-06-08 ENCOUNTER — Ambulatory Visit: Payer: Self-pay | Admitting: Otolaryngology

## 2020-06-11 ENCOUNTER — Encounter: Payer: Self-pay | Admitting: Family Medicine

## 2020-06-11 ENCOUNTER — Other Ambulatory Visit: Payer: Self-pay | Admitting: Family Medicine

## 2020-06-11 ENCOUNTER — Ambulatory Visit: Payer: Self-pay | Attending: Family Medicine | Admitting: Family Medicine

## 2020-06-11 ENCOUNTER — Other Ambulatory Visit: Payer: Self-pay

## 2020-06-11 DIAGNOSIS — I10 Essential (primary) hypertension: Secondary | ICD-10-CM

## 2020-06-11 DIAGNOSIS — J328 Other chronic sinusitis: Secondary | ICD-10-CM

## 2020-06-11 DIAGNOSIS — R7303 Prediabetes: Secondary | ICD-10-CM

## 2020-06-11 MED ORDER — LISINOPRIL-HYDROCHLOROTHIAZIDE 10-12.5 MG PO TABS: 1.0000 | ORAL_TABLET | Freq: Every day | ORAL | 6 refills | Status: DC

## 2020-06-11 MED ORDER — METFORMIN HCL 500 MG PO TABS: 500.0000 mg | ORAL_TABLET | Freq: Every day | ORAL | 6 refills | Status: DC

## 2020-06-11 MED FILL — METFORMIN HCL 500 MG TABS: 500 | 30 days supply | Qty: 30 | Fill #3
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
## 2020-06-12 MED FILL — LISINOPRIL-HCTZ 10-12.5 MG: 10-12.5 | 30 days supply | Qty: 30 | Fill #0

## 2020-06-14 MED FILL — METFORMIN HCL 500 MG TABS: 500 | 30 days supply | Qty: 30 | Fill #0

## 2020-06-20 ENCOUNTER — Ambulatory Visit (INDEPENDENT_AMBULATORY_CARE_PROVIDER_SITE_OTHER): Payer: Self-pay | Admitting: Family Medicine

## 2020-06-20 ENCOUNTER — Encounter: Payer: Self-pay | Admitting: Family Medicine

## 2020-06-20 ENCOUNTER — Other Ambulatory Visit: Payer: Self-pay

## 2020-06-20 VITALS — BP 114/83 | HR 89 | Wt 358.8 lb

## 2020-06-20 DIAGNOSIS — N914 Secondary oligomenorrhea: Secondary | ICD-10-CM | POA: Insufficient documentation

## 2020-06-20 DIAGNOSIS — N83201 Unspecified ovarian cyst, right side: Secondary | ICD-10-CM | POA: Insufficient documentation

## 2020-06-20 MED ORDER — MEGESTROL ACETATE 20 MG PO TABS: 20.0000 mg | ORAL_TABLET | Freq: Every day | ORAL | 11 refills | Status: DC
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
## 2020-06-20 NOTE — Patient Instructions (Signed)
Sangrado uterino anormal Abnormal Uterine Bleeding El sangrado uterino anormal sucede cuando el sangrado del tero es ms duradero o ms abundante de lo normal. Esto puede incluir lo siguiente:  Sangrado entre los perodos menstruales.  Sangrado luego de mantener relaciones sexuales.  Sangrado ms abundante de lo normal.  Perodos menstruales que duran ms de lo normal.  Sangrado despus de haber dejado de tener el perodo menstrual (menopausia). Esta afeccin puede ser causada por muchos problemas. Si tiene un sangrado que no considera normal, debe consultar al mdico. El tratamiento vara segn la causa de la hemorragia. Siga estas instrucciones en su casa: Medicamentos  Use los medicamentos de venta libre y los recetados solamente como se lo haya indicado el mdico.  Informe a su mdico sobre otros los medicamentos que toma. ? Si el mdico se lo indica, deje de tomar aspirina o medicamentos que contengan aspirina. Estos medicamentos pueden aumentar el sangrado.  Es posible que le den comprimidos de hierro para reponer el hierro que el organismo pierde a causa de esta afeccin. Tmelos como se lo haya indicado el mdico. Control del estreimiento Si toma comprimidos de hierro, pueden causarle problemas para defecar (estreimiento). Para prevenir o tratar los problemas para defecar, es posible que deba hacer lo siguiente:  Beber suficiente lquido para mantener el pis (la orina) de color amarillo plido.  Usar medicamentos recetados o de venta libre.  Comer alimentos ricos en fibra. Entre ellos, frijoles, cereales integrales y frutas y verduras frescas.  Limitar los alimentos con alto contenido de grasa y azcar. Estos incluyen alimentos fritos o dulces. Instrucciones generales  Controle su afeccin para detectar cualquier cambio.  No use tampones, no se haga duchas vaginales ni mantenga relaciones sexuales si su mdico le dice que no lo haga.  Cambie los apsitos con  frecuencia.  Hgase exmenes regulares. Esto incluye exmenes plvicos y pruebas de deteccin de cncer de cuello uterino. ? Es su responsabilidad obtener los resultados de cualquier examen que se realice. Pregntele al mdico, o en el departamento donde se realiza el examen, cundo estarn listos los resultados.  Concurra a todas las visitas de seguimiento como se lo haya indicado el mdico. Esto es importante. Comunquese con un mdico si:  El sangrado dura ms de 1 semana.  Se siente mareada por momentos.  Tiene ganas de vomitar (nuseas).  Vomita.  Se siente dbil o siente que va a desvanecerse.  Sus sntomas empeoran. Solicite ayuda de inmediato si:  Se desmaya.  Debe cambiarse los apsitos cada hora.  Tiene dolor de vientre.  Tiene fiebre o escalofros.  Transpira.  Se debilita.  Elimina cogulos de sangre grandes por la vagina. Resumen  El sangrado uterino anormal sucede cuando el sangrado del tero es ms duradero o ms abundante de lo normal.  Cualquier tipo de sangrado que no sea normal debe consultarse con el mdico.  El tratamiento vara segn la causa de la hemorragia.  Obtenga ayuda de inmediato si se desmaya, tiene que cambiarse el apsito cada hora o elimina cogulos de sangre grandes por la vagina. Esta informacin no tiene como fin reemplazar el consejo del mdico. Asegrese de hacerle al mdico cualquier pregunta que tenga. Document Revised: 04/28/2019 Document Reviewed: 04/28/2019 Elsevier Patient Education  2021 Elsevier Inc.  

## 2020-06-20 NOTE — Assessment & Plan Note (Signed)
Discussed that AUB is secondary to obesity and excess estrogen, reports she gained significant weight over past 1-2 years which coincides with AUB. Recommended EMB to exclude endometrial hyperplasia/malignancy and then gave option of progresterone vs hormonal IUD for endometrial protection. Prefers progesterone, given rx for megace 20mg  and instructed to call if she does not respond as expected. Will schedule close follow up for EMB.

## 2020-06-20 NOTE — Assessment & Plan Note (Signed)
Repeat TVUS to assess resolution, likely benign etiology

## 2020-06-21 LAB — POCT PREGNANCY, URINE: Preg Test, Ur: NEGATIVE

## 2020-06-21 MED FILL — MEGESTROL 20 MG TABLET: 20 | 14 days supply | Qty: 14 | Fill #0

## 2020-06-29 ENCOUNTER — Telehealth: Payer: Self-pay | Admitting: Family Medicine

## 2020-06-29 ENCOUNTER — Ambulatory Visit: Payer: Self-pay | Attending: Otolaryngology | Admitting: Otolaryngology

## 2020-06-29 ENCOUNTER — Other Ambulatory Visit: Payer: Self-pay

## 2020-06-29 NOTE — Telephone Encounter (Signed)
Noted  

## 2020-06-29 NOTE — Telephone Encounter (Signed)
Pt came to [employer] about 9:40, stating" I want to reschedule with a different Doctor! I need to have a punctual Doctor, I can't keep waiting, I have to get to work now, you don't pay my bills!" and Cursed.  - Pt referring to appt on 06/29/20 at 8:30am.  /PT wants to see A different Provider for her needs. Please advise and thank you

## 2020-07-02 ENCOUNTER — Other Ambulatory Visit: Payer: Self-pay

## 2020-07-02 ENCOUNTER — Ambulatory Visit: Payer: Self-pay | Attending: Family Medicine | Admitting: Pharmacist

## 2020-07-02 ENCOUNTER — Encounter: Payer: Self-pay | Admitting: Pharmacist

## 2020-07-02 VITALS — BP 120/78

## 2020-07-02 DIAGNOSIS — I10 Essential (primary) hypertension: Secondary | ICD-10-CM

## 2020-07-02 NOTE — Progress Notes (Signed)
   S:    PCP: Dr. Alvis Lemmings   Patient arrives in good spirits. Presents to the clinic for hypertension evaluation, counseling, and management. Patient was referred and last seen by Primary Care Provider on 06/11/2020. Pt's HCTZ was switched to lisinopril/HCTZ at that appointment.   Medication adherence reported.  Current BP Medications include:  Lisinopril-HCTZ 10-12.5 mg daily   Dietary habits include: compliant with salt restriction; denies drinking excess caffeine  Exercise habits include: none  Family / Social history:  - FHx: heart disease, DM - Tobacco: never smoker - Alcohol: denies use   O:  Vitals:   07/02/20 1615  BP: 120/78   Home BP readings: none   Last 3 Office BP readings: BP Readings from Last 3 Encounters:  07/02/20 120/78  06/29/20 117/72  06/20/20 114/83    BMET    Component Value Date/Time   NA 137 04/12/2020 1353   NA 143 03/21/2020 1512   K 3.6 04/12/2020 1353   CL 105 04/12/2020 1353   CO2 24 04/12/2020 1353   GLUCOSE 98 04/12/2020 1353   BUN 10 04/12/2020 1353   BUN 15 03/21/2020 1512   CREATININE 0.57 04/12/2020 1353   CREATININE 0.58 08/09/2015 1109   CALCIUM 8.1 (L) 04/12/2020 1353   GFRNONAA >60 04/12/2020 1353   GFRNONAA >89 06/29/2014 1249   GFRAA 133 03/21/2020 1512   GFRAA >89 06/29/2014 1249    Renal function: CrCl cannot be calculated (Patient's most recent lab result is older than the maximum 21 days allowed.).  Clinical ASCVD: No  The ASCVD Risk score Denman George DC Jr., et al., 2013) failed to calculate for the following reasons:   The 2013 ASCVD risk score is only valid for ages 1 to 46   A/P: Hypertension longstanding currently at goal on current medications. BP Goal = < 130/80 mmHg. Medication adherence reported.  -Continued current regimen. -Counseled on lifestyle modifications for blood pressure control including reduced dietary sodium, increased exercise, adequate sleep.  Results reviewed and written information  provided.   Total time in face-to-face counseling 15 minutes.   F/U Clinic Visit with PCP.    Butch Penny, PharmD, Patsy [place], CPP Clinical Pharmacist [facility] [phone]

## 2020-07-11 ENCOUNTER — Telehealth: Payer: Self-pay | Admitting: Family Medicine

## 2020-07-11 ENCOUNTER — Other Ambulatory Visit: Payer: Self-pay | Admitting: Family Medicine

## 2020-07-11 MED ORDER — MEGESTROL ACETATE 40 MG PO TABS: 40.0000 mg | ORAL_TABLET | Freq: Every day | ORAL | 3 refills | Status: DC

## 2020-07-11 NOTE — Telephone Encounter (Signed)
Pt states she was given a pill to take for 15 days and if pt had abd pain and back pain to call the office back. Pt last pill was 07-06-20 and still pain. Pt request a call back to see what she should do now other than the appt 07-18-20. thanks

## 2020-07-11 NOTE — Addendum Note (Signed)
Addended by: Ed Blalock on: 07/11/2020 03:28 PM   Modules accepted: Orders

## 2020-07-11 NOTE — Telephone Encounter (Signed)
Pt states to call back after 3 pm  [phone]

## 2020-07-11 NOTE — Telephone Encounter (Addendum)
Returned patient with assistance of [employer], Research officer, trade union.   Patient reports that the pills that MD recommended that she take for her to start her period is not working, she has not had a period. She took the last pill last week. She also reports she is having a lot of pain in her back, hips and abdomen.   Spoke with Dr. Crissie Reese, who recommends increasing increasing Megace to 40 mg daily for 14 days. Prescription sent to [facility] at patients request.   Patient reports she is sexually active and is using protection "occasionally". Advised patient to take a home pregnancy test. Advised to take prior to starting Megace and not to take Megace if she is pregnant. If pregnant, please call the office to come in for pregnancy test in office otherwise keep upcoming appointment with Dr. Crissie Reese for Endometrial Bx.   Called [facility] to let them know to cancel the 20 Mg Megace Prescription.

## 2020-07-12 MED FILL — MEGESTROL 40 MG TABLET: 40 | 14 days supply | Qty: 14 | Fill #0

## 2020-07-12 MED FILL — IBUPROFEN 800 MG TABLET: 800 | 20 days supply | Qty: 60 | Fill #1

## 2020-07-12 MED FILL — LISINOPRIL-HCTZ 10-12.5 MG: 10-12.5 | 30 days supply | Qty: 30 | Fill #1

## 2020-07-12 MED FILL — ?CETIRIZINE HCL 10 MG TABLE: 10 | 30 days supply | Qty: 30 | Fill #4

## 2020-07-12 MED FILL — FLUTICASONE PROP 50 MCG SPR: 50 | 30 days supply | Qty: 16 | Fill #3

## 2020-07-12 MED FILL — ?METFORMIN HCL 500MG TABLET: 500 | 30 days supply | Qty: 30 | Fill #1

## 2020-07-12 NOTE — Telephone Encounter (Signed)
Chart reviewed for nurse visit. Agree with plan of care.   Venora Maples, MD 07/12/2020 5:44 PM

## 2020-07-14 ENCOUNTER — Other Ambulatory Visit: Payer: Self-pay

## 2020-07-18 ENCOUNTER — Encounter: Payer: Self-pay | Admitting: Family Medicine

## 2020-07-18 ENCOUNTER — Other Ambulatory Visit (HOSPITAL_COMMUNITY)
Admission: RE | Admit: 2020-07-18 | Discharge: 2020-07-18 | Disposition: A | Discharge status: Home / Self Care | Payer: Self-pay | Source: Ambulatory Visit | Attending: Family Medicine | Admitting: Family Medicine

## 2020-07-18 ENCOUNTER — Ambulatory Visit (INDEPENDENT_AMBULATORY_CARE_PROVIDER_SITE_OTHER): Payer: Self-pay | Admitting: Family Medicine

## 2020-07-18 ENCOUNTER — Other Ambulatory Visit: Payer: Self-pay

## 2020-07-18 VITALS — BP 115/73 | HR 104 | Ht 62.0 in | Wt 356.1 lb

## 2020-07-18 DIAGNOSIS — Z3202 Encounter for pregnancy test, result negative: Secondary | ICD-10-CM

## 2020-07-18 DIAGNOSIS — N914 Secondary oligomenorrhea: Secondary | ICD-10-CM

## 2020-07-18 LAB — POCT PREGNANCY, URINE: Preg Test, Ur: NEGATIVE

## 2020-07-18 NOTE — Progress Notes (Signed)
Patient given informed consent, signed copy in the chart, time out was performed. Appropriate time out taken. . The patient was placed in the lithotomy position and the cervix brought into view with sterile speculum.  Portio of cervix cleansed x 2 with betadine swabs.  A tenaculum was placed in the anterior lip of the cervix.  The uterus was sounded for depth of 10 cm. A pipelle was introduced to into the uterus, suction created,  and an endometrial sample was obtained. All equipment was removed and accounted for.  The patient tolerated the procedure well.    Patient given post procedure instructions. The patient will return in 2 weeks for results.

## 2020-07-18 NOTE — Patient Instructions (Signed)

## 2020-07-19 ENCOUNTER — Encounter: Payer: Self-pay | Admitting: General Practice

## 2020-07-20 LAB — SURGICAL PATHOLOGY

## 2020-07-23 ENCOUNTER — Telehealth: Payer: Self-pay | Admitting: Family Medicine

## 2020-07-23 NOTE — Telephone Encounter (Signed)
Please let patient know that her endometrial biopsy results came back normal.

## 2020-07-24 NOTE — Telephone Encounter (Signed)
Called pt with [employer] # [phone] and left voicemail message that her results from her appt on 07/18/20 with Dr. Crissie Reese are normal if she has any questions or concerns to please give the office a call.   Leonette Nutting  07/24/20

## 2020-08-06 ENCOUNTER — Other Ambulatory Visit: Payer: Self-pay | Admitting: Surgery

## 2020-08-06 ENCOUNTER — Other Ambulatory Visit: Payer: Self-pay

## 2020-08-06 ENCOUNTER — Other Ambulatory Visit: Payer: Self-pay | Admitting: Family Medicine

## 2020-08-06 DIAGNOSIS — J302 Other seasonal allergic rhinitis: Secondary | ICD-10-CM

## 2020-08-06 MED ORDER — FLUTICASONE PROPIONATE 50 MCG/ACT NA SUSP
NASAL | 2 refills | Status: DC
  Filled 2020-08-06: qty 16, 30d supply, fill #0

## 2020-08-06 MED FILL — Metformin HCl Tab 500 MG: ORAL | 30 days supply | Qty: 30 | Fill #0 | Status: AC

## 2020-08-06 MED FILL — Megestrol Acetate Tab 40 MG: ORAL | 30 days supply | Qty: 30 | Fill #0 | Status: AC

## 2020-08-06 MED FILL — Cetirizine HCl Tab 10 MG: ORAL | 30 days supply | Qty: 30 | Fill #0 | Status: AC

## 2020-08-06 NOTE — Telephone Encounter (Signed)
Requested Prescriptions  Pending Prescriptions Disp Refills  . fluticasone (FLONASE) 50 MCG/ACT nasal spray 16 g 2    Sig: USE 2 SPRAY(S) IN EACH NOSTRIL ONCE DAILY     Ear, Nose, and Throat: Nasal Preparations - Corticosteroids Passed - 08/06/2020  4:53 PM      Passed - Valid encounter within last 12 months    Recent Outpatient Visits          1 month ago Essential hypertension   [facility] Lois Huxley, Cornelius Moras, RPH-CPP   1 month ago    [facility] Flo Shanks, MD   1 month ago Other chronic sinusitis    [facility] [name], MD   4 months ago Periumbilical abdominal pain    [facility] [name], MD   6 months ago Inflammatory heel pain, right   [facility] Claiborne Rigg, NP      Future Appointments            In 4 weeks Drucilla Chalet, RPH-CPP [facility]

## 2020-08-07 ENCOUNTER — Other Ambulatory Visit: Payer: Self-pay

## 2020-08-08 ENCOUNTER — Other Ambulatory Visit: Payer: Self-pay

## 2020-08-13 ENCOUNTER — Other Ambulatory Visit: Payer: Self-pay | Admitting: Surgery

## 2020-08-13 ENCOUNTER — Other Ambulatory Visit: Payer: Self-pay

## 2020-08-15 ENCOUNTER — Ambulatory Visit: Payer: Self-pay | Admitting: Family

## 2020-08-20 NOTE — Progress Notes (Signed)
Patient ID: Stacie Mitchell, female    DOB: 29-Mar-1986  MRN: [id number]  CC: Hypertension Follow-Up  Subjective: Stacie Mitchell is a 35 y.o. female who presents for hypertension follow-up. Her concerns today include:   1. HYPERTENSION FOLLOW-UP: 06/11/2020: Uncontrolled Switched from HCTZ to lisinopril/HCTZ Counseled on blood pressure goal of less than 130/80, low-sodium, DASH diet, medication compliance, 150 minutes of moderate intensity exercise per week. Discussed medication compliance, adverse effects. She has been placed on the clinical pharmacist schedule for reassessment of her blood pressure in 2 weeks.  Consider increasing dose of lisinopril/HCTZ blood pressure is still above goal  08/21/2020: Currently taking: see medication list Med Adherence: [x]  Yes, reports sometimes taking medication in the morning and sometimes at noon. Medication side effects: [x]  Yes, feeling of fatigue and drowsiness Adherence with salt restriction (low-salt diet): [x]  Yes   Exercise: Yes [x]  No []    Home Monitoring?: []  Yes    [x]  No Monitoring Frequency: []  Yes    [x]  No Home BP results range: []  Yes    [x]  No Smoking []  Yes [x]  No SOB? []  Yes    [x]  No Chest Pain?: []  Yes    [x]  No Leg swelling?: []  Yes    [x]  No Headaches?: [x]  Yes    []  No   Patient Active Problem List   Diagnosis Date Noted  . 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 Right ovarian cyst 06/20/2020  . Umbilical hernia without obstruction and without gangrene 04/11/2020  . Skin tag 11/12/2016  . Ingrown right greater toenail 10/24/2015  . Morbid obesity with BMI of 70 and over, adult (HCC) 09/06/2015  . Tinea pedis of both feet 12/14/2014  . Prediabetes 12/14/2014  . Headache 06/19/2014     Current Outpatient Medications on File Prior to Visit  Medication Sig Dispense Refill  . cetirizine (ZYRTEC) 10 MG tablet TAKE 1 TABLET (10 MG TOTAL) BY MOUTH DAILY. 30 tablet 6  . famotidine (PEPCID) 20 MG  tablet Take 1 tablet (20 mg total) by mouth 2 (two) times daily. 180 tablet 1  . fluticasone (FLONASE) 50 MCG/ACT nasal spray USE 2 SPRAY(S) IN EACH NOSTRIL ONCE DAILY 16 g 2  . ibuprofen (ADVIL) 800 MG tablet TAKE 1 TABLET BY MOUTH EVERY 8 HOURS AS NEEDED FOR MODERATE PAIN. 60 tablet 1  . megestrol (MEGACE) 40 MG tablet TAKE 1 TABLET (40 MG TOTAL) BY MOUTH DAILY. 14 tablet 3  . metFORMIN (GLUCOPHAGE) 500 MG tablet TAKE 1 TABLET (500 MG TOTAL) BY MOUTH DAILY WITH BREAKFAST. 30 tablet 6  . [DISCONTINUED] hydrochlorothiazide (HYDRODIURIL) 25 MG tablet Take 1 tablet (25 mg total) by mouth daily. 90 tablet 1   No current facility-administered medications on file prior to visit.    No Known Allergies  Social History   Socioeconomic History  . Marital status: Single    Spouse name: Not on file  . Number of children: 2  . Years of education: Not on file  . Highest education level: High school graduate  Occupational History  . Not on file  Tobacco Use  . Smoking status: Never Smoker  . Smokeless tobacco: Never Used  Vaping Use  . Vaping Use: Never used  Substance and Sexual Activity  . Alcohol use: No  . Drug use: No  . Sexual activity: Yes    Birth control/protection: Injection, None  Other Topics Concern  . Not on file  Social History Narrative   Lives with her 2  children. Feels safe with them.      X boyfriend will stay with patient after surgery.   Social Determinants of Health   Financial Resource Strain: Not on file  Food Insecurity: Food Insecurity Present  . Worried About Programme researcher, broadcasting/film/video in the Last Year: Sometimes true  . Ran Out of Food in the Last Year: Sometimes true  Transportation Needs: Unmet Transportation Needs  . Lack of Transportation (Medical): Yes  . Lack of Transportation (Non-Medical): Yes  Physical Activity: Not on file  Stress: Not on file  Social Connections: Not on file  Intimate Partner Violence: Not on file    Family History  Problem  Relation Age of Onset  . Heart disease Mother   . Diabetes Father   . Anesthesia problems Neg Hx   . Hypotension Neg Hx   . Malignant hyperthermia Neg Hx   . Pseudochol deficiency Neg Hx     Past Surgical History:  Procedure Laterality Date  . 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  . CESAREAN SECTION    . ROBOTIC UMBILICAL HERNIA REPAIR  04/24/2020    ROS: Review of Systems Negative except as stated above  PHYSICAL EXAM: BP 103/67 (BP Location: Right Arm, Patient Position: Sitting, Cuff Size: Large)   Pulse 91   Temp 98.4 F (36.9 C)   Ht 5' 2.01" (1.575 m)   Wt (!) 355 lb 3.2 oz (161.1 kg)   SpO2 99%   BMI 64.95 kg/m   Physical Exam HENT:     Head: Normocephalic and atraumatic.  Eyes:     Extraocular Movements: Extraocular movements intact.     Conjunctiva/sclera: Conjunctivae normal.     Pupils: Pupils are equal, round, and reactive to light.  Cardiovascular:     Rate and Rhythm: Normal rate and regular rhythm.     Pulses: Normal pulses.     Heart sounds: Normal heart sounds.  Pulmonary:     Effort: Pulmonary effort is normal.     Breath sounds: Normal breath sounds.  Musculoskeletal:     Cervical back: Normal range of motion.  Neurological:     General: No focal deficit present.     Mental Status: She is alert and oriented to person, place, and time.  Psychiatric:        Mood and Affect: Mood normal.        Behavior: Behavior normal.    ASSESSMENT AND PLAN: 1. Essential hypertension: - Blood pressure at goal during today's visit.  - Continue Lisinopril-Hydrochlorothiazide as prescribed. - Counseled on blood pressure goal of less than 130/80, low-sodium, DASH diet, medication compliance, 150 minutes of moderate intensity exercise per week as tolerated. Discussed medication compliance, adverse effects. - Counseled to take Lisinopril-Hydrochlorothiazide daily at bedtime to possibly decrease side effects of fatigue during the  daytime related to medication. - BMP to evaluate kidney function and electrolyte balance. - Follow-up with primary provider in 2 weeks or sooner if needed. - Basic Metabolic Panel - lisinopril-hydrochlorothiazide (ZESTORETIC) 10-12.5 MG tablet; Take 1 tablet by mouth daily.  Dispense: 90 tablet; Refill: 0  2. Language barrier: - [employer] participated during today's visit. Interpreter Name: Donzetta Kohut, ID#: [id number].   Patient was given the opportunity to ask questions.  Patient verbalized understanding of the plan and was able to repeat key elements of the plan. Patient was given clear instructions to go to Emergency Department or return to medical center if symptoms don't improve, worsen,  or new problems develop.The patient verbalized understanding.   Orders Placed This Encounter  Procedures  . Basic Metabolic Panel     Requested Prescriptions   Signed Prescriptions Disp Refills  . lisinopril-hydrochlorothiazide (ZESTORETIC) 10-12.5 MG tablet 90 tablet 0    Sig: Take 1 tablet by mouth daily.    Return in about 2 weeks (around 09/04/2020) for Follow-Up hypertension Dr. Alvis Lemmings .  Rema Fendt, NP

## 2020-08-21 ENCOUNTER — Other Ambulatory Visit: Payer: Self-pay

## 2020-08-21 ENCOUNTER — Encounter: Payer: Self-pay | Admitting: Family

## 2020-08-21 ENCOUNTER — Ambulatory Visit (INDEPENDENT_AMBULATORY_CARE_PROVIDER_SITE_OTHER): Payer: Self-pay | Admitting: Family

## 2020-08-21 VITALS — BP 103/67 | HR 91 | Temp 98.4°F | Ht 62.01 in | Wt 355.2 lb

## 2020-08-21 DIAGNOSIS — R7303 Prediabetes: Secondary | ICD-10-CM

## 2020-08-21 DIAGNOSIS — Z789 Other specified health status: Secondary | ICD-10-CM

## 2020-08-21 DIAGNOSIS — I10 Essential (primary) hypertension: Secondary | ICD-10-CM

## 2020-08-21 MED ORDER — LISINOPRIL-HYDROCHLOROTHIAZIDE 10-12.5 MG PO TABS
1.0000 | ORAL_TABLET | Freq: Every day | ORAL | 0 refills | Status: DC
  Filled 2020-08-21: qty 30, 30d supply, fill #0

## 2020-08-21 NOTE — Progress Notes (Signed)
Hypertension f/u Pt stated that BP meds are making her fatigue, drowsy,

## 2020-08-22 ENCOUNTER — Other Ambulatory Visit: Payer: Self-pay

## 2020-08-22 LAB — BASIC METABOLIC PANEL
BUN/Creatinine Ratio: 20 (ref 9–23)
BUN: 14 mg/dL (ref 6–20)
CO2: 22 mmol/L (ref 20–29)
Calcium: 9 mg/dL (ref 8.7–10.2)
Chloride: 105 mmol/L (ref 96–106)
Creatinine, Ser: 0.71 mg/dL (ref 0.57–1.00)
Glucose: 72 mg/dL (ref 65–99)
Potassium: 4 mmol/L (ref 3.5–5.2)
Sodium: 142 mmol/L (ref 134–144)
eGFR: 112 mL/min/{1.73_m2} (ref >59)

## 2020-08-22 NOTE — Progress Notes (Signed)
Kidney function normal

## 2020-08-28 ENCOUNTER — Emergency Department (HOSPITAL_COMMUNITY)
Admission: EM | Admit: 2020-08-28 | Discharge: 2020-08-29 | Disposition: A | Discharge status: Home / Self Care | Payer: Self-pay | Attending: Emergency Medicine | Admitting: Emergency Medicine

## 2020-08-28 ENCOUNTER — Ambulatory Visit: Payer: Self-pay | Admitting: Family Medicine

## 2020-08-28 ENCOUNTER — Emergency Department (HOSPITAL_COMMUNITY): Payer: Self-pay

## 2020-08-28 ENCOUNTER — Encounter (HOSPITAL_COMMUNITY): Payer: Self-pay

## 2020-08-28 ENCOUNTER — Other Ambulatory Visit: Payer: Self-pay

## 2020-08-28 DIAGNOSIS — E119 Type 2 diabetes mellitus without complications: Secondary | ICD-10-CM | POA: Insufficient documentation

## 2020-08-28 DIAGNOSIS — I10 Essential (primary) hypertension: Secondary | ICD-10-CM | POA: Insufficient documentation

## 2020-08-28 DIAGNOSIS — R079 Chest pain, unspecified: Secondary | ICD-10-CM | POA: Insufficient documentation

## 2020-08-28 DIAGNOSIS — R519 Headache, unspecified: Secondary | ICD-10-CM | POA: Insufficient documentation

## 2020-08-28 DIAGNOSIS — Z20822 Contact with and (suspected) exposure to covid-19: Secondary | ICD-10-CM | POA: Insufficient documentation

## 2020-08-28 DIAGNOSIS — Z79899 Other long term (current) drug therapy: Secondary | ICD-10-CM | POA: Insufficient documentation

## 2020-08-28 DIAGNOSIS — R1084 Generalized abdominal pain: Secondary | ICD-10-CM | POA: Insufficient documentation

## 2020-08-28 DIAGNOSIS — Z7984 Long term (current) use of oral hypoglycemic drugs: Secondary | ICD-10-CM | POA: Insufficient documentation

## 2020-08-28 DIAGNOSIS — R42 Dizziness and giddiness: Secondary | ICD-10-CM | POA: Insufficient documentation

## 2020-08-28 DIAGNOSIS — R110 Nausea: Secondary | ICD-10-CM | POA: Insufficient documentation

## 2020-08-28 LAB — CBC WITH DIFFERENTIAL/PLATELET
Abs Immature Granulocytes: 0.02 10*3/uL (ref 0.00–0.07)
Basophils Absolute: 0 10*3/uL (ref 0.0–0.1)
Basophils Relative: 0 %
Eosinophils Absolute: 0.1 10*3/uL (ref 0.0–0.5)
Eosinophils Relative: 2 %
HCT: 41.2 % (ref 36.0–46.0)
Hemoglobin: 13.8 g/dL (ref 12.0–15.0)
Immature Granulocytes: 0 %
Lymphocytes Relative: 28 %
Lymphs Abs: 2 10*3/uL (ref 0.7–4.0)
MCH: 30.9 pg (ref 26.0–34.0)
MCHC: 33.5 g/dL (ref 30.0–36.0)
MCV: 92.4 fL (ref 80.0–100.0)
Monocytes Absolute: 0.6 10*3/uL (ref 0.1–1.0)
Monocytes Relative: 8 %
Neutro Abs: 4.5 10*3/uL (ref 1.7–7.7)
Neutrophils Relative %: 62 %
Platelets: 294 10*3/uL (ref 150–400)
RBC: 4.46 MIL/uL (ref 3.87–5.11)
RDW: 12.6 % (ref 11.5–15.5)
WBC: 7.2 10*3/uL (ref 4.0–10.5)
nRBC: 0 % (ref 0.0–0.2)

## 2020-08-28 LAB — COMPREHENSIVE METABOLIC PANEL
ALT: 20 U/L (ref 0–44)
AST: 19 U/L (ref 15–41)
Albumin: 3.7 g/dL (ref 3.5–5.0)
Alkaline Phosphatase: 47 U/L (ref 38–126)
Anion gap: 5 (ref 5–15)
BUN: 8 mg/dL (ref 6–20)
CO2: 25 mmol/L (ref 22–32)
Calcium: 8.9 mg/dL (ref 8.9–10.3)
Chloride: 106 mmol/L (ref 98–111)
Creatinine, Ser: 0.72 mg/dL (ref 0.44–1.00)
GFR, Estimated: >60 mL/min (ref >60)
Glucose, Bld: 98 mg/dL (ref 70–99)
Potassium: 3.6 mmol/L (ref 3.5–5.1)
Sodium: 136 mmol/L (ref 135–145)
Total Bilirubin: 0.3 mg/dL (ref 0.3–1.2)
Total Protein: 6.8 g/dL (ref 6.5–8.1)

## 2020-08-28 LAB — RESP PANEL BY RT-PCR (FLU A&B, COVID) ARPGX2
Influenza A by PCR: NEGATIVE
Influenza B by PCR: NEGATIVE
SARS Coronavirus 2 by RT PCR: NEGATIVE

## 2020-08-28 LAB — I-STAT BETA HCG BLOOD, ED (MC, WL, AP ONLY): I-stat hCG, quantitative: <5 m[IU]/mL (ref <5)

## 2020-08-28 LAB — TROPONIN I (HIGH SENSITIVITY): Troponin I (High Sensitivity): 3 ng/L (ref <18)

## 2020-08-28 MED ORDER — DIPHENHYDRAMINE HCL 25 MG PO CAPS
25.0000 mg | ORAL_CAPSULE | Freq: Once | ORAL | Status: AC
  Administered 2020-08-28: 25 mg via ORAL

## 2020-08-28 MED ORDER — MECLIZINE HCL 25 MG PO TABS
25.0000 mg | ORAL_TABLET | Freq: Once | ORAL | Status: AC
  Administered 2020-08-28: 25 mg via ORAL
  Filled 2020-08-28: qty 1

## 2020-08-28 MED ORDER — PROCHLORPERAZINE EDISYLATE 10 MG/2ML IJ SOLN
10.0000 mg | Freq: Once | INTRAMUSCULAR | Status: DC
  Filled 2020-08-28: qty 2

## 2020-08-28 MED ORDER — DIPHENHYDRAMINE HCL 50 MG/ML IJ SOLN
25.0000 mg | Freq: Once | INTRAMUSCULAR | Status: DC
  Filled 2020-08-28: qty 1

## 2020-08-28 MED ORDER — ACETAMINOPHEN 500 MG PO TABS
1000.0000 mg | ORAL_TABLET | Freq: Once | ORAL | Status: AC
  Administered 2020-08-28: 1000 mg via ORAL
  Filled 2020-08-28: qty 2

## 2020-08-28 MED ORDER — PROCHLORPERAZINE EDISYLATE 10 MG/2ML IJ SOLN
10.0000 mg | Freq: Once | INTRAMUSCULAR | Status: AC
  Administered 2020-08-28: 10 mg via INTRAMUSCULAR
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
## 2020-08-28 NOTE — ED Triage Notes (Signed)
Spanish interpreter [phone]  Pt reports headache, chest pressure, dizzy x3 days and weakness. Unable to see her PCP until Monday. Pt also reports bilateral hand numbness. She reports her pain and BP medications are not working. Pt asking for pain medication. Pt last took 800mg  ibuprofen this am.

## 2020-08-28 NOTE — ED Provider Notes (Signed)
Emergency Medicine Provider Triage Evaluation Note  Stacie Mitchell , a 35 y.o. female  was evaluated in triage.  Pt complains of HA, cp, leg weakness, dizziness..  She reports severe headache, overall feels ill.  She reports chest pain/pressure as well as bilateral leg weakness.  She also reports dizziness.  She is taking her normal medication without improvement.  Unable to see PCP today.  Dizziness is worse when she stands up.  Movement of her eyes.  Associated nausea, no vomiting.  She has been having issues with dizziness when taking her blood pressure medicine past couple weeks.  Review of Systems  Positive: HA, dizziness, CP Negative: fever  Physical Exam  BP 104/75 (BP Location: Right Arm)   Pulse 77   Temp 99.1 F (37.3 C) (Oral)   Resp 19   SpO2 100%  Gen:   Awake, no distress   Resp:  Normal effort  MSK:   Moves extremities without difficulty  Other:  EOMI.  No TTP of the abdomen.  Medical Decision Making  Medically screening exam initiated at 12:39 PM.  Appropriate orders placed.  Stacie Mitchell was informed that the remainder of the evaluation will be completed by another provider, this initial triage assessment does not replace that evaluation, and the importance of remaining in the ED until their evaluation is complete.  Labs, EKG, chest x-ray   Alveria Apley, PA-C 08/28/20 1250    Pricilla Loveless, MD 08/28/20 1343

## 2020-08-29 ENCOUNTER — Ambulatory Visit: Payer: Self-pay | Attending: Family Medicine | Admitting: Family Medicine

## 2020-08-29 ENCOUNTER — Other Ambulatory Visit: Payer: Self-pay

## 2020-08-29 ENCOUNTER — Encounter: Payer: Self-pay | Admitting: Family Medicine

## 2020-08-29 VITALS — BP 103/68 | HR 91 | Ht 62.0 in | Wt 353.6 lb

## 2020-08-29 DIAGNOSIS — I10 Essential (primary) hypertension: Secondary | ICD-10-CM

## 2020-08-29 DIAGNOSIS — R7303 Prediabetes: Secondary | ICD-10-CM

## 2020-08-29 DIAGNOSIS — R42 Dizziness and giddiness: Secondary | ICD-10-CM

## 2020-08-29 LAB — POCT GLYCOSYLATED HEMOGLOBIN (HGB A1C): HbA1c, POC (controlled diabetic range): 5.8 % (ref 0.0–7.0)

## 2020-08-29 MED ORDER — METFORMIN HCL 500 MG PO TABS
ORAL_TABLET | Freq: Every day | ORAL | 6 refills | Status: DC
Start: 2020-08-29 — End: 2020-12-19
  Filled 2020-08-29: qty 30, 30d supply, fill #0
  Filled 2020-09-25: qty 30, 30d supply, fill #1
  Filled 2020-11-06: qty 30, 30d supply, fill #2
  Filled 2020-12-05: qty 30, 30d supply, fill #3

## 2020-08-29 MED ORDER — IBUPROFEN 800 MG PO TABS
800.0000 mg | ORAL_TABLET | Freq: Two times a day (BID) | ORAL | 0 refills | Status: DC
  Filled 2020-08-29: qty 8, 4d supply, fill #0

## 2020-08-29 MED ORDER — IBUPROFEN 800 MG PO TABS
800.0000 mg | ORAL_TABLET | Freq: Two times a day (BID) | ORAL | 0 refills | Status: DC | PRN
  Filled 2020-08-29 – 2020-09-25 (×2): qty 30, 15d supply, fill #0

## 2020-08-29 MED ORDER — MECLIZINE HCL 25 MG PO TABS
25.0000 mg | ORAL_TABLET | Freq: Three times a day (TID) | ORAL | 0 refills | Status: DC | PRN
  Filled 2020-08-29: qty 60, 20d supply, fill #0

## 2020-08-29 MED ORDER — MECLIZINE HCL 25 MG PO TABS
25.0000 mg | ORAL_TABLET | Freq: Three times a day (TID) | ORAL | 0 refills | Status: DC | PRN
  Filled 2020-08-29: qty 21, 7d supply, fill #0

## 2020-08-29 MED ORDER — DICYCLOMINE HCL 20 MG PO TABS
20.0000 mg | ORAL_TABLET | Freq: Two times a day (BID) | ORAL | 0 refills | Status: DC
  Filled 2020-08-29: qty 20, 10d supply, fill #0
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
## 2020-08-29 NOTE — Progress Notes (Signed)
BP was low in ED yesterday, was having dizziness. Stopped BP medication.

## 2020-08-29 NOTE — Patient Instructions (Signed)
Vrtigo Vertigo El vrtigo es la sensacin de que usted o las cosas que lo rodean se mueven cuando en realidad eso no sucede. Esta sensacin puede aparecer y desaparecer en cualquier momento. El vrtigo suele desaparecer solo. Esta afeccin puede ser peligrosa si ocurre cuando est realizando actividades como conducir o trabajar con mquinas. El mdico le har pruebas para encontrar la causa del vrtigo. Estas pruebas tambin ayudarn al mdico a decidir cul es el mejor tratamiento para usted. Siga estas indicaciones en su casa: Comida y bebida  Beba suficiente lquido para mantener el pis (la orina) de color amarillo plido.  No beba alcohol.      Actividad  Retome sus actividades habituales como se lo haya indicado el mdico. Pregntele al mdico qu actividades son seguras para usted.  Por la maana, sintese primero a un lado de la cama. Cuando se sienta bien, pngase lentamente de pie mientras se sostiene de algo, hasta que sepa que ha logrado el equilibrio.  Muvase lentamente. Evite determinadas posiciones o hacer movimientos repentinos del cuerpo o de la cabeza, como se lo haya indicado el mdico.  Use un bastn si tiene dificultad para ponerse de pie o caminar.  Si se siente mareado, sintese de inmediato.  Evite realizar tareas o actividades que puedan causarle peligro a usted o a otras personas si se marea.  Evite agacharse si se siente mareado. En su casa, coloque los objetos de modo que le resulte fcil alcanzarlos sin agacharse.  No conduzca vehculos ni opere maquinaria pesada si se siente mareado. Indicaciones generales  Tome los medicamentos de venta libre y los recetados solamente como se lo haya indicado el mdico.  Concurra a todas las visitas de control como se lo haya indicado el mdico. Esto es importante. Comunquese con un mdico si:  Los medicamentos no le alivian el vrtigo.  Tiene fiebre.  Los problemas empeoran o le aparecen sntomas  nuevos.  Sus familiares o amigos observan cambios en su comportamiento.  La sensacin de malestar estomacal empeora.  Los vmitos empeoran.  Pierde la sensibilidad (tiene entumecimiento) en alguna parte del cuerpo.  Siente pinchazos u hormigueo en alguna parte del cuerpo. Solicite ayuda inmediatamente si:  Tiene dificultad para moverse o para caminar.  Esta mareado todo el tiempo.  Pierde el conocimiento (se desmaya).  Tiene dolores de cabeza muy intensos.  Siente debilidad en las manos, los brazos o las piernas.  Tiene cambios en la audicin.  Tiene cambios en la forma de ver (visin).  Tiene rigidez en el cuello.  La luz brillante empieza a molestarlo. Resumen  El vrtigo es la sensacin de que usted o las cosas que lo rodean se mueven cuando en realidad eso no sucede.  El mdico le har pruebas para encontrar la causa del vrtigo.  Tal vez le indiquen que evite algunas tareas, posiciones o movimientos.  Comunquese con un mdico si los medicamentos no lo ayudan o si tiene fiebre, sntomas nuevos o un cambio en el comportamiento.  Pida ayuda de inmediato si tiene dolores de cabeza muy intensos o si tiene cambios en la manera de hablar, or o ver. Esta informacin no tiene como fin reemplazar el consejo del mdico. Asegrese de hacerle al mdico cualquier pregunta que tenga. Document Revised: 04/19/2018 Document Reviewed: 04/19/2018 Elsevier Patient Education  2021 Elsevier Inc.   

## 2020-08-29 NOTE — Discharge Instructions (Addendum)
Take Bentyl for abdominal pain.  Take meclizine for dizziness.  He can take ibuprofen as directed.  As we discussed, monitor blood pressure.  If if your blood pressure is running low and you feel lightheaded, do not take your blood pressure medication.  It is important that you call your primary care doctor tomorrow and discuss this with them.  Return to the emergency department for any worsening abdominal pain, chest pain, difficulty breathing, difficulty walking, numbness/weakness of arms or legs or any other worsening concerning symptoms.

## 2020-09-03 ENCOUNTER — Ambulatory Visit: Payer: Self-pay | Admitting: Pharmacist

## 2020-09-04 ENCOUNTER — Other Ambulatory Visit: Payer: Self-pay

## 2020-09-05 ENCOUNTER — Other Ambulatory Visit: Payer: Self-pay

## 2020-09-25 ENCOUNTER — Other Ambulatory Visit: Payer: Self-pay

## 2020-09-25 ENCOUNTER — Other Ambulatory Visit: Payer: Self-pay | Admitting: Family Medicine

## 2020-09-25 ENCOUNTER — Ambulatory Visit (INDEPENDENT_AMBULATORY_CARE_PROVIDER_SITE_OTHER): Payer: Self-pay | Admitting: Family Medicine

## 2020-09-25 ENCOUNTER — Encounter: Payer: Self-pay | Admitting: Family Medicine

## 2020-09-25 VITALS — BP 124/87 | HR 112 | Ht 63.0 in | Wt 360.8 lb

## 2020-09-25 DIAGNOSIS — R42 Dizziness and giddiness: Secondary | ICD-10-CM

## 2020-09-25 DIAGNOSIS — N914 Secondary oligomenorrhea: Secondary | ICD-10-CM

## 2020-09-25 MED ORDER — MEDROXYPROGESTERONE ACETATE 10 MG PO TABS
10.0000 mg | ORAL_TABLET | Freq: Every day | ORAL | 11 refills | Status: DC
  Filled 2020-09-25: qty 10, 10d supply, fill #0
  Filled 2020-11-06: qty 10, 10d supply, fill #1
  Filled 2020-12-05: qty 10, 10d supply, fill #2
  Filled 2021-01-04: qty 10, 10d supply, fill #3
  Filled 2021-04-30: qty 10, 10d supply, fill #0

## 2020-09-25 MED FILL — Cetirizine HCl Tab 10 MG: ORAL | 30 days supply | Qty: 30 | Fill #1 | Status: AC

## 2020-09-25 NOTE — Telephone Encounter (Signed)
Requested medication (s) are due for refill today: yes  Requested medication (s) are on the active medication list: yes   Last refill: 09/04/2020  Future visit scheduled: yes   Notes to clinic: this refill cannot be delegated    Requested Prescriptions  Pending Prescriptions Disp Refills   meclizine (ANTIVERT) 25 MG tablet 60 tablet 0    Sig: Take 1 tablet (25 mg total) by mouth 3 (three) times daily as needed for dizziness.      Not Delegated - Gastroenterology: Antiemetics Failed - 09/25/2020 10:48 AM      Failed - This refill cannot be delegated      Passed - Valid encounter within last 6 months    Recent Outpatient Visits           3 weeks ago Vertigo   [facility] [name], MD   2 months ago Essential hypertension   [facility] Lois Huxley, Cornelius Moras, RPH-CPP   2 months ago    [facility] Flo Shanks, MD   3 months ago Other chronic sinusitis   [facility] [name], MD   6 months ago Periumbilical abdominal pain   [facility] [name], MD       Future Appointments             In 1 week [name], MD [facility]
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
## 2020-09-25 NOTE — Progress Notes (Signed)
Pt states taking megace Rx for 10 days to have cycle. Pt states had light spotting last Sunday and no acutal cycle bleeding  for past 3 months.

## 2020-09-25 NOTE — Patient Instructions (Signed)
Cuidados preventivos en las mujeres de 21 a 71 aos de edad Preventive Care 36-37 Years Old, Female Los cuidados preventivos hacen referencia a las opciones en cuanto al estilo de vida y a las visitas al mdico, las cuales pueden promover la salud y Musician. Esto puede comprender lo siguiente: Un examen fsico anual. Esto tambin se conoce como visita de control de bienestar anual. Exmenes dentales y oculares de [place] regular. Vacunas. Estudios para Health and safety inspector. Elecciones para un estilo de vida saludable, por ejemplo: Seguir una dieta saludable. Practicar actividad fsica con regularidad. No consumir drogas ni productos que contengan nicotina y tabaco. Limitar el consumo de bebidas alcohlicas. Qu puedo esperar para mi visita de cuidado preventivo? Examen fsico El mdico puede controlar lo siguiente: Cassell Clement y [place]. Estos pueden usarse para calcular el IMC (ndice de masa corporal). El [facility] es una medicin que indica si tiene un peso saludable. Frecuencia cardaca y presin arterial. Temperatura corporal. Piel para detectar manchas anormales. Asesoramiento Su mdico puede preguntarle acerca de: Problemas mdicos pasados. Antecedentes mdicos familiares. Consumo de tabaco, alcohol y drogas. Su bienestar emocional. Restaurant manager, fast food y las relaciones personales. Su actividad sexual. Hbitos de alimentacin, ejercicio y sueo. Su trabajo y [place] laboral. Acceso a armas de fuego. Mtodos anticonceptivos. Ciclo menstrual. Antecedentes de embarazo. Qu vacunas necesito?  Las vacunas se aplican a varias edades, segn un calendario. El [employer] recomendar vacunas segn su edad, sus antecedentes mdicos, su estilo de viday otros factores, como los viajes o el lugar donde trabaja. Qu pruebas necesito?  Anlisis de [place] de lpidos y colesterol. Estos se pueden verificar cada 5 aos, a partir de los 66 aos de [place]. Anlisis de hepatitis  C. Anlisis de hepatitis B. Pruebas de deteccin Pruebas de deteccin de la diabetes. Esto se Set designer un control del azcar en la sangre (glucosa) despus de no haber comido durante un periodo de tiempo (ayuno). Pruebas de enfermedades de transmisin sexual (ETS), si est en riesgo. Pruebas de deteccin de cncer relacionado con las mutaciones del BRCA. Es posible que se las deba realizar si tiene antecedentes de cncer de mama, de ovario, de trompas o peritoneal. Examen plvico y prueba de Papanicolaou. Esto se puede Optometrist cada 3 aos a partir de los 21 aos de [place]. A partir de los 30 aos, esto se puede Optometrist cada 5 aos si usted se realiza una prueba de Papanicolaou en combinacin con una prueba de deteccin del virus del papiloma humano (VPH). Hable con su mdico [employer], las opciones detratamiento y, si corresponde, la necesidad de Optometrist ms pruebas. Siga estas instrucciones en su casa: Comida y bebida  Siga una dieta saludable que incluya frutas y verduras frescas, cereales integrales, protenas magras y productos lcteos descremados. Tome los suplementos vitamnicos y [employer] se lo haya indicado el mdico. No beba alcohol si: Su mdico le indica no hacerlo. Est embarazada, puede estar embarazada o est tratando de [place]. Si bebe alcohol: Limite la cantidad que consume de 0 a 1 medida por da. Est atenta a la cantidad de alcohol que hay en las bebidas que toma. En los [place], una medida equivale a una botella de cerveza de 12 oz (355 ml), un vaso de vino de 5 oz (148 ml) o un vaso de una bebida alcohlica de alta graduacin de 1 oz (44 ml).  Estilo de [employer] y las encas a diario. Cepllese los dientes a la  maana y a la noche con pasta dental con fluoruro. Use hilo dental una vez al da. Mantngase activa. Haga al menos 30 minutos de ejercicio, 5 o ms das cada semana. No consuma ningn  producto que contenga nicotina o tabaco, como cigarrillos, cigarrillos electrnicos y tabaco de Higher education careers adviser. Si necesita ayuda para dejar de fumar, consulte al mdico. No consuma drogas. Si es sexualmente activa, practique sexo seguro. Use un condn u otra forma de proteccin para prevenir las ITS (infecciones de transmisin sexual). Si no desea quedar embarazada, use un mtodo anticonceptivo. Si busca un embarazo, realice una consulta previa al embarazo con el mdico. Encuentre formas saludables de lidiar con el estrs tales como: Meditacin, yoga o Conservation officer, nature. Lleve un diario personal. Hable con una persona confiable. Pase tiempo con amigos y familiares. Seguridad [place] siempre el cinturn de seguridad al conducir o viajar en un vehculo. No conduzca: Si ha estado bebiendo alcohol. No viaje con un conductor que ha estado bebiendo. Si est cansada o distrada. Mientras est enviando mensajes de texto. Use un casco y otros equipos de proteccin [place] deportivas. Si tiene armas de fuego en su casa, asegrese de seguir todos los procedimientos de seguridad correspondientes. Busque ayuda si fue vctima de abuso fsico o abuso sexual. Cundo volver? Acuda al mdico una vez al ao para una visita anual de control de bienestar. Pregntele al mdico con qu frecuencia debe realizarse un control de la vista y los dientes. Mantenga su esquema de vacunacin al da. Esta informacin no tiene Marine scientist el consejo del mdico. Asegresede hacerle al mdico cualquier pregunta que tenga. Document Revised: 01/26/2020 Document Reviewed: 03/08/2019 Elsevier Patient Education  2022 [employer].

## 2020-09-25 NOTE — Assessment & Plan Note (Signed)
Emphasized with patient that primary danger is unopposed estrogen and that lack of menses is not in and of itself a problem. Reviewed that taking progesterone will help protect her uterus. Offered further diagnostic testing to evaluate for other causes though given significant weight gain in past 1-2 years they seem less likely (ovarian insufficiency, CAH, etc) but due to being self pay she declines. Will switch progesterone to Provera and see if this helps. RTC PRN.

## 2020-09-26 ENCOUNTER — Other Ambulatory Visit: Payer: Self-pay

## 2020-09-26 MED ORDER — MECLIZINE HCL 25 MG PO TABS
25.0000 mg | ORAL_TABLET | Freq: Three times a day (TID) | ORAL | 1 refills | Status: DC | PRN
Start: 2020-09-26 — End: 2020-12-26
  Filled 2020-09-26: qty 60, 20d supply, fill #0

## 2020-09-28 ENCOUNTER — Other Ambulatory Visit: Payer: Self-pay

## 2020-10-01 ENCOUNTER — Other Ambulatory Visit: Payer: Self-pay

## 2020-10-03 ENCOUNTER — Ambulatory Visit: Payer: Self-pay | Admitting: Family Medicine

## 2020-10-08 ENCOUNTER — Ambulatory Visit: Payer: Self-pay | Attending: Family Medicine

## 2020-10-08 ENCOUNTER — Other Ambulatory Visit: Payer: Self-pay

## 2020-11-06 ENCOUNTER — Other Ambulatory Visit: Payer: Self-pay | Admitting: Family Medicine

## 2020-11-06 ENCOUNTER — Other Ambulatory Visit: Payer: Self-pay

## 2020-11-06 DIAGNOSIS — J302 Other seasonal allergic rhinitis: Secondary | ICD-10-CM

## 2020-11-06 MED ORDER — CETIRIZINE HCL 10 MG PO TABS
10.0000 mg | ORAL_TABLET | Freq: Every day | ORAL | 2 refills | Status: DC
  Filled 2020-11-06: qty 30, 30d supply, fill #0
  Filled 2020-12-05: qty 30, 30d supply, fill #1

## 2020-11-06 MED ORDER — IBUPROFEN 800 MG PO TABS
800.0000 mg | ORAL_TABLET | Freq: Two times a day (BID) | ORAL | 2 refills | Status: DC | PRN
  Filled 2020-11-06: qty 30, 15d supply, fill #0
  Filled 2020-12-05: qty 30, 15d supply, fill #1

## 2020-11-06 NOTE — Telephone Encounter (Signed)
  Notes to clinic:  Patient no showed appt on 10/03/2020 Review for refill    Requested Prescriptions  Pending Prescriptions Disp Refills   cetirizine (ZYRTEC) 10 MG tablet 30 tablet 6    Sig: TAKE 1 TABLET (10 MG TOTAL) BY MOUTH DAILY.      Ear, Nose, and Throat:  Antihistamines Passed - 11/06/2020  8:56 AM      Passed - Valid encounter within last 12 months    Recent Outpatient Visits           2 months ago Vertigo   [facility] [name], MD   4 months ago Essential hypertension   [facility] [place], Cornelius Moras, RPH-CPP   4 months ago    [facility] [place], Zola Button, MD   4 months ago Other chronic sinusitis   [facility] [place], Odette Horns, MD   7 months ago Periumbilical abdominal pain   [facility] [place], Odette Horns, MD                  ibuprofen (ADVIL) 800 MG tablet 30 tablet 0    Sig: Take 1 tablet (800 mg total) by mouth 2 (two) times daily as needed.      Analgesics:  NSAIDS Passed - 11/06/2020  8:56 AM      Passed - Cr in normal range and within 360 days    Creat  Date Value Ref Range Status  08/09/2015 0.58 0.50 - 1.10 mg/dL Final   Creatinine, Ser  Date Value Ref Range Status  08/28/2020 0.72 0.44 - 1.00 mg/dL Final   Creatinine, Urine  Date Value Ref Range Status  10/05/2009 54.3 mg/dL Final          Passed - HGB in normal range and within 360 days    Hemoglobin  Date Value Ref Range Status  08/28/2020 13.8 12.0 - 15.0 g/dL Final  76/28/3151 76.1 11.1 - 15.9 g/dL Final          Passed - Patient is not pregnant      Passed - Valid encounter within last 12 months    Recent Outpatient Visits           2 months ago Vertigo   [facility] [name], MD   4 months ago Essential hypertension   [facility] [place]is Huxley, Cornelius Moras, RPH-CPP   4 months ago    [facility] Flo Shanks, MD   4 months ago Other chronic sinusitis   [facility] [name], MD   7 months ago Periumbilical abdominal pain   [facility] [name], MD

## 2020-11-07 ENCOUNTER — Other Ambulatory Visit: Payer: Self-pay

## 2020-11-09 ENCOUNTER — Other Ambulatory Visit: Payer: Self-pay

## 2020-12-05 ENCOUNTER — Other Ambulatory Visit: Payer: Self-pay

## 2020-12-10 ENCOUNTER — Other Ambulatory Visit: Payer: Self-pay

## 2020-12-19 ENCOUNTER — Other Ambulatory Visit: Payer: Self-pay

## 2020-12-19 ENCOUNTER — Ambulatory Visit: Payer: Self-pay | Attending: Physician Assistant | Admitting: Physician Assistant

## 2020-12-19 ENCOUNTER — Encounter: Payer: Self-pay | Admitting: Physician Assistant

## 2020-12-19 VITALS — BP 105/73 | HR 85 | Resp 16 | Wt 366.0 lb

## 2020-12-19 DIAGNOSIS — Z789 Other specified health status: Secondary | ICD-10-CM

## 2020-12-19 DIAGNOSIS — G5603 Carpal tunnel syndrome, bilateral upper limbs: Secondary | ICD-10-CM

## 2020-12-19 DIAGNOSIS — R300 Dysuria: Secondary | ICD-10-CM

## 2020-12-19 DIAGNOSIS — R7303 Prediabetes: Secondary | ICD-10-CM

## 2020-12-19 DIAGNOSIS — K429 Umbilical hernia without obstruction or gangrene: Secondary | ICD-10-CM

## 2020-12-19 DIAGNOSIS — J302 Other seasonal allergic rhinitis: Secondary | ICD-10-CM

## 2020-12-19 LAB — POCT URINALYSIS DIP (CLINITEK)
Bilirubin, UA: NEGATIVE
Blood, UA: NEGATIVE
Glucose, UA: NEGATIVE mg/dL
Ketones, POC UA: NEGATIVE mg/dL
Leukocytes, UA: NEGATIVE
Nitrite, UA: NEGATIVE
Spec Grav, UA: >=1.03 — AB (ref 1.010–1.025)
Urobilinogen, UA: 0.2 E.U./dL
pH, UA: 5 (ref 5.0–8.0)

## 2020-12-19 MED ORDER — IBUPROFEN 800 MG PO TABS
800.0000 mg | ORAL_TABLET | Freq: Two times a day (BID) | ORAL | 2 refills | Status: DC | PRN
  Filled 2020-12-19 – 2021-01-04 (×2): qty 30, 15d supply, fill #0
  Filled 2021-02-12: qty 30, 15d supply, fill #1
  Filled 2021-05-09 – 2021-05-17 (×2): qty 30, 15d supply, fill #0

## 2020-12-19 MED ORDER — CETIRIZINE HCL 10 MG PO TABS
10.0000 mg | ORAL_TABLET | Freq: Every day | ORAL | 2 refills | Status: DC
  Filled 2020-12-19 – 2021-01-04 (×2): qty 30, 30d supply, fill #0
  Filled 2021-02-12: qty 30, 30d supply, fill #1
  Filled 2021-04-30: qty 30, 30d supply, fill #0

## 2020-12-19 MED ORDER — METFORMIN HCL 500 MG PO TABS
ORAL_TABLET | Freq: Every day | ORAL | 6 refills | Status: DC
  Filled 2020-12-19: qty 30, fill #0
  Filled 2021-01-04: qty 30, 30d supply, fill #0
  Filled 2021-02-12: qty 30, 30d supply, fill #1
  Filled 2021-03-15: qty 30, 30d supply, fill #2
  Filled 2021-04-30: qty 30, 30d supply, fill #0
  Filled 2021-09-18: qty 30, 30d supply, fill #1
  Filled 2021-10-18: qty 30, 30d supply, fill #2
  Filled 2021-11-22: qty 30, 30d supply, fill #3

## 2020-12-19 MED ORDER — FLUTICASONE PROPIONATE 50 MCG/ACT NA SUSP
NASAL | 2 refills | Status: DC
  Filled 2020-12-19: qty 16, 30d supply, fill #0
  Filled 2021-02-12: qty 16, 25d supply, fill #0
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
## 2020-12-19 NOTE — Patient Instructions (Addendum)
Drink 80-100 ounces water daily  Get carpal tunnel splints   Sndrome del tnel carpiano Carpal Tunnel Syndrome El sndrome del tnel carpiano es una afeccin que causa dolor, debilidad y adormecimiento en la mano y los dedos. El adormecimiento es cuando no siente una zona del cuerpo. El tnel carpiano es un rea estrecha que se encuentra en el lado palmar de la [place]. Los movimientos repetidos de la mueca o determinadas enfermedades pueden causar hinchazn en el tnel. Esta hinchazn puede comprimir el nervio principal de la [place]. Este nervio se llama "nervio mediano". Cules son las causas? Esta afeccin puede ser causada por lo siguiente: Mover la mano y la Center y otra vez mientras realiza una tarea. Lesin en la [place]. Artritis. Un saco lleno de lquido (quiste) o un crecimiento anormal (tumor) en el tnel carpiano. Acumulacin de lquido Academic librarian. Uso de herramientas que vibran. Algunas veces, la causa no se conoce. Qu incrementa el riesgo? Los siguientes factores pueden hacer que sea ms propenso a Warehouse manager esta afeccin: Tener un trabajo en el que deba hacer estas cosas: Mover la mano una y [place]. Trabajar con herramientas que vibran, como taladros o lijadoras. Ser mujer. Tener diabetes, obesidad, problemas de tiroides o insuficiencia renal. Cules son los signos o sntomas? Los sntomas de esta afeccin incluyen: Sensacin de hormigueo en los dedos. Hormigueo o prdida de la sensibilidad de Engineer, site. Dolor en todo el brazo. Este dolor puede empeorar al flexionar la [place] y el codo durante [place]. Dolor en la mueca que sube por el brazo hasta el hombro. Dolor que baja hasta la palma de la mano o los dedos. Debilidad en las manos. Puede resultarle difcil tomar y Personal assistant. Es posible que se sienta peor por la noche. Cmo se trata? El tratamiento de esta afeccin puede incluir: Cambios en el estilo de vida. Se le pedir que deje o  cambie la actividad que caus el problema. Hacer ejercicios y [address] para que los [place], los msculos y los tendones se vuelvan ms fuertes (fisioterapia). Aprender a Hotel manager nuevamente (terapia ocupacional). Medicamentos para Chief Technology Officer y la hinchazn. Es posible que le apliquen inyecciones en la [place]. Una frula o un dispositivo ortopdico para la [place]. [place]. Siga estas instrucciones en su casa: Si tiene una frula o un dispositivo ortopdico: Use la frula o el dispositivo ortopdico como se lo haya indicado el mdico. Quteselos solamente como se lo haya indicado el mdico. Afloje la frula si los dedos: Hormiguean. Se adormecen. Se tornan fros y de [employer]. Mantenga la frula o el dispositivo ortopdico limpios. Si la frula o el dispositivo ortopdico no son impermeables: No deje que se mojen. Cbralos con un envoltorio hermtico cuando tome un bao de inmersin o una ducha. Control del dolor, la rigidez y la hinchazn Si se lo indican, aplique hielo sobre la zona dolorida: Si tiene un dispositivo ortopdico o una frula desmontable, quteselos como se lo haya indicado el mdico. Ponga el hielo en una bolsa plstica. Coloque una toalla entre la piel y Copy. Coloque el hielo durante 20 minutos, 2 a 3 veces al da. No se quede dormido con la bolsa de hielo [employer]. Retire el hielo si la piel se le pone de color rojo brillante. Esto es Intel. Si no puede sentir dolor, calor o fro, tiene un mayor riesgo de que se dae la zona. Mueva los dedos con frecuencia para reducir la rigidez y la hinchazn. Instrucciones  generales Baxter [employer] de venta libre y los recetados solamente como se lo haya indicado el mdico. Descanse la [place] de cualquier actividad que le cause dolor. Si es necesario, hable con su jefe en el [employer] cambios que pueden ayudar a la curacin de la [place]. Haga los ejercicios que le hayan indicado el mdico, el  fisioterapeuta o el terapeuta ocupacional. Cumpla con todas las visitas de seguimiento. Comunquese con un mdico si: Aparecen nuevos sntomas. Los medicamentos no Tourist information centre manager. Sus sntomas empeoran. Solicite ayuda de inmediato si: Tiene adormecimiento u hormigueo muy intensos en la mueca o la [place]. Resumen El sndrome del tnel carpiano es una afeccin que causa dolor en la mano y en el brazo. Suele deberse a movimientos repetidos de Warden/ranger. Este problema se trata mediante cambios en el estilo de vida y medicamentos. La ciruga puede ser necesaria en casos muy graves. Siga las instrucciones del mdico sobre el uso de una frula, el reposo de la [place], la asistencia a las consultas de seguimiento y Freight forwarder para pedir ayuda. Esta informacin no tiene Theme park manager el consejo del mdico. Asegrese de hacerle al mdico cualquier pregunta que tenga. Document Revised: 09/16/2019 Document Reviewed: 09/16/2019 Elsevier Patient Education  2022 [employer].

## 2020-12-26 ENCOUNTER — Ambulatory Visit (INDEPENDENT_AMBULATORY_CARE_PROVIDER_SITE_OTHER): Payer: Self-pay | Admitting: Surgery

## 2020-12-26 ENCOUNTER — Encounter: Payer: Self-pay | Admitting: Surgery

## 2020-12-26 ENCOUNTER — Other Ambulatory Visit: Payer: Self-pay

## 2020-12-26 VITALS — BP 166/94 | HR 93 | Temp 98.2°F | Ht 62.0 in | Wt 369.2 lb

## 2020-12-26 DIAGNOSIS — K429 Umbilical hernia without obstruction or gangrene: Secondary | ICD-10-CM

## 2020-12-26 DIAGNOSIS — R1030 Lower abdominal pain, unspecified: Secondary | ICD-10-CM

## 2020-12-26 NOTE — Patient Instructions (Addendum)
haga un seguimiento con nuestra oficina segn sea necesario.  haga un seguimiento con su gineclogo para su dolor plvico inferior.
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
## 2021-01-04 ENCOUNTER — Other Ambulatory Visit: Payer: Self-pay

## 2021-01-08 ENCOUNTER — Other Ambulatory Visit: Payer: Self-pay

## 2021-01-09 ENCOUNTER — Other Ambulatory Visit: Payer: Self-pay

## 2021-01-15 ENCOUNTER — Other Ambulatory Visit: Payer: Self-pay

## 2021-01-15 ENCOUNTER — Ambulatory Visit: Payer: Self-pay | Attending: Family Medicine | Admitting: Family Medicine

## 2021-01-15 VITALS — BP 152/94 | HR 79 | Ht 62.0 in | Wt 373.4 lb

## 2021-01-15 DIAGNOSIS — T22111A Burn of first degree of right forearm, initial encounter: Secondary | ICD-10-CM

## 2021-01-15 DIAGNOSIS — R21 Rash and other nonspecific skin eruption: Secondary | ICD-10-CM

## 2021-01-15 DIAGNOSIS — I10 Essential (primary) hypertension: Secondary | ICD-10-CM

## 2021-01-15 DIAGNOSIS — R7303 Prediabetes: Secondary | ICD-10-CM

## 2021-01-15 LAB — POCT GLYCOSYLATED HEMOGLOBIN (HGB A1C): HbA1c, POC (prediabetic range): 5.8 % (ref 5.7–6.4)

## 2021-01-15 MED ORDER — SILVER SULFADIAZINE 1 % EX CREA
1.0000 "application " | TOPICAL_CREAM | Freq: Every day | CUTANEOUS | 0 refills | Status: DC
  Filled 2021-01-15: qty 25, 12d supply, fill #0

## 2021-01-15 MED ORDER — HYDROCHLOROTHIAZIDE 25 MG PO TABS
25.0000 mg | ORAL_TABLET | Freq: Every day | ORAL | 1 refills | Status: DC
  Filled 2021-01-15: qty 30, 30d supply, fill #0
  Filled 2021-02-12: qty 30, 30d supply, fill #1
  Filled 2021-03-15: qty 30, 30d supply, fill #2
  Filled 2021-04-30: qty 30, 30d supply, fill #0

## 2021-01-15 MED ORDER — CLOTRIMAZOLE-BETAMETHASONE 1-0.05 % EX CREA
1.0000 "application " | TOPICAL_CREAM | Freq: Every day | CUTANEOUS | 0 refills | Status: DC
  Filled 2021-01-15: qty 30, 15d supply, fill #0
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
## 2021-01-15 NOTE — Patient Instructions (Signed)
Cuidados de las [employer], Adult [place] es una lesin en la piel o en los tejidos que se encuentran debajo de la piel, causada por fuego, un lquido caliente, una sustancia qumica o electricidad. Hay tres tipos de quemaduras: Museum/gallery conservator. Estas quemaduras pueden causar enrojecimiento en la piel y un poco de hinchazn. Estas quemaduras no forman ampollas ni cicatrices. [employer]. Estas quemaduras son Lynnae Sandhoff dolorosas y producen mucho enrojecimiento en la piel. La piel tambin puede hincharse, segregar lquido, tener un aspecto brillante y Forensic psychologist. Tercer Gae Bon. Estas quemaduras pueden causar un [employer]. La piel se vuelve blanca o negra y puede parecer carbonizada, seca y correosa. Estas quemaduras pueden no ser dolorosas debido al dao causado en las terminaciones nerviosas. El tratamiento de la [place] depende del tipo de [place]. El cuidado correcto de las quemaduras puede ayudar a Psychologist, sport and exercise y a Radio producer las infecciones. Adems, puede ayudar a que la cicatrizacin sea ms rpida. Cmo cuidar una quemadura de primer grado Inmediatamente despus de sufrir la quemadura: Enjuague o sumerja la quemadura en agua fra durante 5 minutos o ms. No aplique hielo en la quemadura. Esto puede causarle ms daos. Aplique un pao limpio, hmedo y fro (compresa fra) sobre la piel. Esto puede Engineer, materials. Aplique locin o gel con aloe vera sobre la piel. Esto puede ayudar a [employer]. Cuidado de la [employer] las instrucciones del mdico acerca de cmo debe limpiar y cuidar la [place]. Pueden incluir: Higienizar la zona con agua y un [place]. Usar un pao limpio para secar la zona quemada, dndole palmaditas, despus de limpiarla. No se frote ni restriegue la quemadura. Aplicar locin o gel con aloe vera sobre la piel. Cmo cuidar una quemadura de segundo grado Inmediatamente despus de sufrir la quemadura: Enjuague o  remoje la quemadura con agua fra. Hgalo durante 5 a 10 minutos. No aplique hielo en la quemadura. Esto puede causarle ms daos. Qutese las alhajas que tenga cerca de la zona [place]. Cubra ligeramente la quemadura con un pao limpio. Cuidado de la [employer] est sentado o acostado, levante (eleve) la zona de la lesin por encima del nivel del corazn. Siga las instrucciones del mdico acerca de cmo debe limpiar y cuidar la [place]. Pueden incluir: Limpiar o enjuagar (irrigar) la zona quemada. Aplicar una crema o ungento sobre la [place]. Colocar un vendaje libre de grmenes (estril) sobre la [place]. Un vendaje es Health and safety inspector que se coloca sobre una quemadura para favorecer su cicatrizacin. Cmo cuidar una quemadura de tercer grado Inmediatamente despus de sufrir la quemadura: [place] ligeramente la quemadura con un pao limpio y seco. Busque tratamiento de inmediato si tiene este tipo de [place]. Es posible: Que necesite hospitalizacin. Que reciba tratamiento con ciruga para extirpar el tejido daado o colocar un injerto de piel para cubrir la zona daada. Que le administren lquidos intravenosos (i.v.) para Engineer, building services. Cuidado de la [employer] las instrucciones del mdico acerca de cmo debe limpiar y cuidar la [place]. Pueden incluir: Limpiar o enjuagar (irrigar) la zona quemada. Aplicar una crema o ungento sobre la [place]. Colocar un vendaje estril en la zona quemada (taponamiento). Colocar un vendaje estril sobre la [place]. Otras indicaciones Cuando est sentado o acostado, eleve la zona lesionada por encima del nivel del corazn. Use la frula o el inmovilizador como se lo haya indicado el mdico. Haga reposo como se lo haya indicado el mdico. No participe en deportes ni en  otras actividades fsicas hasta que el mdico lo autorice. Cmo prevenir una infeccin al cuidar [employer] Rx  Tome estas medidas para evitar la  infeccin: Lvese las manos con agua y jabn durante al menos 20 segundos antes y despus de Education officer, environmental el cuidado de la [place]. Use desinfectante para manos si no dispone de [place] y [place]. Use guantes limpios o estriles como se lo haya indicado el mdico. No ponga mantequilla, aceite, pasta dental ni otros remedios caseros [employer]gate. No se rasque ni se toque la quemadura. No rompa ninguna ampolla. No quite las escamas de piel. No frote la quemadura, ni siquiera cuando la est limpiando. Revsese la [employer] para detectar estos signos de infeccin: Aumento del enrojecimiento, la hinchazn o Chief Technology Officer. [place]. Pus o mal olor. Lneas rojas alrededor [employer] Corporation. Siga estas instrucciones en su casa Medicamentos [employer] medicamentos de venta libre y los recetados solamente como se lo haya indicado el mdico. Si le recetaron un antibitico, tmelo o aplqueselo como se lo haya indicado el mdico. No deje de usar el antibitico aunque la afeccin mejore. El mdico puede recomendarle que tome analgsicos de venta libre o recetados antes de cambiarse el vendaje. Instrucciones generales Proteja la quemadura del sol. Beba suficiente lquido como para Pharmacologist la orina de color amarillo plido. No consuma ningn producto que contenga nicotina o tabaco, como cigarrillos, cigarrillos electrnicos y tabaco de Theatre manager. Estos pueden retrasar la recuperacin. Si necesita ayuda para dejar de fumar, consulte al mdico. Concurra a todas las visitas de seguimiento como se lo haya indicado el mdico. Esto es importante. Comunquese con un mdico si: La afeccin no mejora. Su afeccin empeora. Tiene fiebre o escalofros. La quemadura est caliente al tacto. Tiene ms enrojecimiento, hinchazn o dolor en el lugar de la [place]. La quemadura cambia de aspecto o aparecen manchas negras o rojas. Tiene dolor y no puede controlarlo con Tourist information centre manager. Solicite ayuda inmediatamente  si: Observa ms lquido, sangre o pus que sale de la [place]. Lneas rojas cerca de la [place]. Dolor intenso. Resumen Hay tres tipos de [place]. Pueden ser de Engineer, maintenance, segundo y Landscape architect. El tipo ms grave de [place] es la [place] de tercer grado, que debe tratarse de inmediato. El tratamiento de la [place] depende del tipo de [place]. No ponga mantequilla, aceite, pasta dental ni otros remedios caseros [employer]gate. Esto puede causarle ms daos al tejido. Siga las instrucciones del mdico acerca de cmo limpiar y cuidar la [place]. Esta informacin no tiene Theme park manager el consejo del mdico. Asegrese de hacerle al mdico cualquier pregunta que tenga. Document Revised: 04/13/2019 Document Reviewed: 04/13/2019 Elsevier Patient Education  2022 [employer].

## 2021-01-16 ENCOUNTER — Encounter: Payer: Self-pay | Admitting: Family Medicine

## 2021-01-16 ENCOUNTER — Other Ambulatory Visit: Payer: Self-pay

## 2021-01-18 NOTE — Addendum Note (Signed)
Addended byMemory Dance on: 01/18/2021 08:27 AM   Modules accepted: Orders

## 2021-01-19 LAB — BASIC METABOLIC PANEL
BUN/Creatinine Ratio: 18 (ref 9–23)
BUN: 12 mg/dL (ref 6–20)
CO2: 18 mmol/L — ABNORMAL LOW (ref 20–29)
Calcium: 9.9 mg/dL (ref 8.7–10.2)
Chloride: 101 mmol/L (ref 96–106)
Creatinine, Ser: 0.68 mg/dL (ref 0.57–1.00)
Glucose: 94 mg/dL (ref 70–99)
Potassium: 4.2 mmol/L (ref 3.5–5.2)
Sodium: 138 mmol/L (ref 134–144)
eGFR: 115 mL/min/{1.73_m2} (ref >59)

## 2021-01-25 ENCOUNTER — Telehealth: Payer: Self-pay

## 2021-01-25 NOTE — Telephone Encounter (Signed)
Patient has viewed results via mychart  

## 2021-01-25 NOTE — Telephone Encounter (Signed)
-----   Message from [name], MD sent at 01/19/2021  8:53 AM EDT ----- Please inform the patient that labs are normal. Thank you.

## 2021-02-12 ENCOUNTER — Other Ambulatory Visit: Payer: Self-pay

## 2021-02-15 ENCOUNTER — Other Ambulatory Visit: Payer: Self-pay

## 2021-02-26 ENCOUNTER — Ambulatory Visit: Payer: Self-pay | Admitting: *Deleted

## 2021-02-26 NOTE — Telephone Encounter (Signed)
Reason for Disposition  [1] Systolic BP  >= 160 OR Diastolic >= 100 AND [2] cardiac or neurologic symptoms (e.g., chest pain, difficulty breathing, unsteady gait, blurred vision)  Answer Assessment - Initial Assessment Questions 1. BLOOD PRESSURE: "What is the blood pressure?" "Did you take at least two measurements 5 minutes apart?"    160's-170's/ 102-107 2. ONSET: "When did you take your blood pressure?"     Yesterday at [facility]  3. HOW: "How did you obtain the blood pressure?" (e.g., visiting nurse, automatic home BP monitor)     At [facility] does not have home BP montior 4. HISTORY: "Do you have a history of high blood pressure?"     Yes  5. MEDICATIONS: "Are you taking any medications for blood pressure?" "Have you missed any doses recently?"     Yes have not missed doses 6. OTHER SYMPTOMS: "Do you have any symptoms?" (e.g., headache, chest pain, blurred vision, difficulty breathing, weakness)     Headaches , feels heavy, hot  and had been dizzy at times  7. PREGNANCY: "Is there any chance you are pregnant?" "When was your last menstrual period?"     na  Protocols used: Blood Pressure - High-A-AH

## 2021-02-26 NOTE — Telephone Encounter (Signed)
C/o elevated BP since yesterday and c/o headache, heavy and hot. C/o dizziness. Hands feel like she doesn't want someone to touch her. Took bp at [employer] and ranging from 160'-170's / 102-107  yesterday . Patient does not have a home monitor. C/o headache denies weakness on either side of body. Denies chest pain,difficulty breathing. No fever. Denies blurred vision no speech deficits. No N/T. Recommended ED and patient reports she does not want to sit in ED for 6 hours or more. And earliest appt with any provider not until 03/04/21. Please advise . Patient requesting additional medication . Please call patient back . Care advise given to increase water intake and decrease eating salty foods. Patient verbalized understanding of care advise and to go to ED but does not want to go .

## 2021-02-27 ENCOUNTER — Ambulatory Visit: Payer: Self-pay | Admitting: *Deleted

## 2021-02-27 ENCOUNTER — Encounter: Payer: Self-pay | Admitting: *Deleted

## 2021-02-27 NOTE — Telephone Encounter (Signed)
Scheduled appt at [facility] on Friday a.m. to address BP concerns. Patient is at work and requested to be  notified via MyChart.

## 2021-02-27 NOTE — Telephone Encounter (Signed)
Able to reach pt. Reports BP has been elevated, today 161/98.  States no missed doses of BP meds. Message from practice read to patient regarding appt at [facility], information provided. Advised ED for worsening headache, care advise per protocol. Verbalizes understanding.

## 2021-02-27 NOTE — Telephone Encounter (Signed)
Reason for Disposition  Systolic BP  >= 160 OR Diastolic >= 100  Answer Assessment - Initial Assessment Questions 1. BLOOD PRESSURE: "What is the blood pressure?" "Did you take at least two messurements 5 minutes apart?"     175/102   161/98 today 2. ONSET: "When did you take your blood pressure?"     today 3. HOW: "How did you obtain the blood pressure?" (e.g., visiting nurse, automatic home BP monitor)     [facility] 4. HISTORY: "Do you have a history of high blood pressure?"     yes 5. MEDICATIONS: "Are you taking any medications for blood pressure?" "Have you missed any doses recently?"     Yes, no missed doses 6. OTHER SYMPTOMS: "Do you have any symptoms?" (e.g., headache, chest pain, blurred vision, difficulty breathing, weakness)    Headache 11/10  Protocols used: Blood Pressure - High-A-AH

## 2021-03-01 ENCOUNTER — Ambulatory Visit (INDEPENDENT_AMBULATORY_CARE_PROVIDER_SITE_OTHER): Payer: Self-pay | Admitting: Nurse Practitioner

## 2021-03-01 ENCOUNTER — Other Ambulatory Visit: Payer: Self-pay

## 2021-03-01 ENCOUNTER — Encounter: Payer: Self-pay | Admitting: Nurse Practitioner

## 2021-03-01 VITALS — BP 130/82 | HR 79 | Temp 98.2°F | Resp 18 | Ht 62.0 in | Wt 357.0 lb

## 2021-03-01 DIAGNOSIS — N898 Other specified noninflammatory disorders of vagina: Secondary | ICD-10-CM

## 2021-03-01 DIAGNOSIS — I10 Essential (primary) hypertension: Secondary | ICD-10-CM

## 2021-03-01 DIAGNOSIS — R079 Chest pain, unspecified: Secondary | ICD-10-CM

## 2021-03-01 LAB — EKG 12-LEAD: EKG 12 lead: NORMAL

## 2021-03-01 MED ORDER — TIZANIDINE HCL 4 MG PO TABS
4.0000 mg | ORAL_TABLET | Freq: Four times a day (QID) | ORAL | 0 refills | Status: DC | PRN
  Filled 2021-03-01: qty 30, 8d supply, fill #0

## 2021-03-01 MED ORDER — LISINOPRIL 5 MG PO TABS
5.0000 mg | ORAL_TABLET | Freq: Every day | ORAL | 3 refills | Status: DC
  Filled 2021-03-01: qty 30, 30d supply, fill #0

## 2021-03-01 NOTE — Patient Instructions (Signed)
Hypertension:  Continue HCTZ  Will order low dose lisinopril  Keep close check on BP  Work on healthy weight  Low sodium diet  Will order EKG  Will order labs  Headache:  Will order tizanadine  Follow up:  Follow up with PCP in 2 weeks

## 2021-03-01 NOTE — Progress Notes (Signed)
Patient has taken medication today. Patient has not eaten today. Patient reports HA for the past week everyday with minimal relief from ibuprofen and tylenol. HA is located in the forehead radiating to the top and back of the head.

## 2021-03-01 NOTE — Assessment & Plan Note (Signed)
Continue HCTZ  Will order low dose lisinopril  Keep close check on BP  Work on healthy weight  Low sodium diet  Will order EKG  Will order labs  Headache:  Will order tizanadine  Follow up:  Follow up with PCP in 2 weeks
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
## 2021-03-02 LAB — CMP14+EGFR
ALT: 21 IU/L (ref 0–32)
AST: 19 IU/L (ref 0–40)
Albumin/Globulin Ratio: 1.7 (ref 1.2–2.2)
Albumin: 4 g/dL (ref 3.8–4.8)
Alkaline Phosphatase: 65 IU/L (ref 44–121)
BUN/Creatinine Ratio: 19 (ref 9–23)
BUN: 12 mg/dL (ref 6–20)
Bilirubin Total: 0.3 mg/dL (ref 0.0–1.2)
CO2: 26 mmol/L (ref 20–29)
Calcium: 8.8 mg/dL (ref 8.7–10.2)
Chloride: 102 mmol/L (ref 96–106)
Creatinine, Ser: 0.62 mg/dL (ref 0.57–1.00)
Globulin, Total: 2.4 g/dL (ref 1.5–4.5)
Glucose: 99 mg/dL (ref 70–99)
Potassium: 3.7 mmol/L (ref 3.5–5.2)
Sodium: 140 mmol/L (ref 134–144)
Total Protein: 6.4 g/dL (ref 6.0–8.5)
eGFR: 118 mL/min/{1.73_m2} (ref >59)

## 2021-03-02 LAB — CBC WITH DIFFERENTIAL/PLATELET
Basophils Absolute: 0 10*3/uL (ref 0.0–0.2)
Basos: 0 %
EOS (ABSOLUTE): 0.1 10*3/uL (ref 0.0–0.4)
Eos: 2 %
Hematocrit: 41.6 % (ref 34.0–46.6)
Hemoglobin: 13.8 g/dL (ref 11.1–15.9)
Immature Grans (Abs): 0 10*3/uL (ref 0.0–0.1)
Immature Granulocytes: 0 %
Lymphocytes Absolute: 1.6 10*3/uL (ref 0.7–3.1)
Lymphs: 18 %
MCH: 30.1 pg (ref 26.6–33.0)
MCHC: 33.2 g/dL (ref 31.5–35.7)
MCV: 91 fL (ref 79–97)
Monocytes Absolute: 0.5 10*3/uL (ref 0.1–0.9)
Monocytes: 5 %
Neutrophils Absolute: 6.7 10*3/uL (ref 1.4–7.0)
Neutrophils: 75 %
Platelets: 308 10*3/uL (ref 150–450)
RBC: 4.58 x10E6/uL (ref 3.77–5.28)
RDW: 13.1 % (ref 11.7–15.4)
WBC: 8.9 10*3/uL (ref 3.4–10.8)

## 2021-03-14 ENCOUNTER — Ambulatory Visit: Payer: Self-pay | Admitting: Nurse Practitioner

## 2021-03-15 ENCOUNTER — Other Ambulatory Visit: Payer: Self-pay

## 2021-04-17 ENCOUNTER — Ambulatory Visit: Payer: Self-pay | Admitting: Family Medicine

## 2021-04-30 ENCOUNTER — Other Ambulatory Visit: Payer: Self-pay

## 2021-05-01 ENCOUNTER — Other Ambulatory Visit: Payer: Self-pay

## 2021-05-08 ENCOUNTER — Ambulatory Visit: Payer: Self-pay

## 2021-05-08 NOTE — Telephone Encounter (Signed)
Interpreter Eulis Foster Chief Complaint: UTI Symptoms: burning, itching, abdominal pain, foul odor Frequency: 2 weeks Pertinent Negatives: NA Disposition: [] ED /[] Urgent Care (no appt availability in office) / [] Appointment(In office/virtual)/ []  [place] [facility]/ [] Home Care/ [] Refused Recommended Disposition /[x]  Mobile Bus/ []  Follow-up with PCP Additional Notes: pt also wants to have check up for BP and DM   Summary: sooner appt for blood pressure and diabetes check   Pt requests appt for follow up to check diabetes and blood pressure but there are no appts available until March. Pt stated she went to another doctor and she was told she needed to schedule appt with pcp to follow up since both were elevated       Reason for Disposition  Bad or foul-smelling urine  Answer Assessment - Initial Assessment Questions 1. SYMPTOM: "What's the main symptom you're concerned about?" (e.g., frequency, incontinence)     Burning, itching, foul odor 2. ONSET: "When did the  sx  start?"     2 weeks 3. PAIN: "Is there any pain?" If Yes, ask: "How bad is it?" (Scale: 1-10; mild, moderate, severe)     mild  5. OTHER SYMPTOMS: "Do you have any other symptoms?" (e.g., fever, flank pain, blood in urine, pain with urination)     Hand numbness  Protocols used: Urinary Symptoms-A-AH

## 2021-05-09 ENCOUNTER — Other Ambulatory Visit: Payer: Self-pay

## 2021-05-16 ENCOUNTER — Other Ambulatory Visit: Payer: Self-pay

## 2021-05-17 ENCOUNTER — Ambulatory Visit: Payer: Self-pay | Attending: Family Medicine

## 2021-05-17 ENCOUNTER — Other Ambulatory Visit: Payer: Self-pay

## 2021-05-28 ENCOUNTER — Ambulatory Visit: Payer: Self-pay | Attending: Family Medicine | Admitting: Family Medicine

## 2021-05-28 ENCOUNTER — Other Ambulatory Visit (HOSPITAL_COMMUNITY)
Admission: RE | Admit: 2021-05-28 | Discharge: 2021-05-28 | Disposition: A | Discharge status: Home / Self Care | Payer: Self-pay | Source: Ambulatory Visit | Attending: Family Medicine | Admitting: Family Medicine

## 2021-05-28 ENCOUNTER — Other Ambulatory Visit: Payer: Self-pay

## 2021-05-28 ENCOUNTER — Encounter: Payer: Self-pay | Admitting: Family Medicine

## 2021-05-28 VITALS — BP 151/85 | HR 91 | Ht 62.0 in | Wt 373.4 lb

## 2021-05-28 DIAGNOSIS — R399 Unspecified symptoms and signs involving the genitourinary system: Secondary | ICD-10-CM | POA: Insufficient documentation

## 2021-05-28 DIAGNOSIS — R7303 Prediabetes: Secondary | ICD-10-CM

## 2021-05-28 DIAGNOSIS — M25561 Pain in right knee: Secondary | ICD-10-CM

## 2021-05-28 DIAGNOSIS — G8929 Other chronic pain: Secondary | ICD-10-CM

## 2021-05-28 DIAGNOSIS — I10 Essential (primary) hypertension: Secondary | ICD-10-CM

## 2021-05-28 LAB — POCT GLYCOSYLATED HEMOGLOBIN (HGB A1C): HbA1c, POC (controlled diabetic range): 6 % (ref 0.0–7.0)

## 2021-05-28 LAB — POCT URINALYSIS DIP (CLINITEK)
Bilirubin, UA: NEGATIVE
Blood, UA: NEGATIVE
Glucose, UA: NEGATIVE mg/dL
Ketones, POC UA: NEGATIVE mg/dL
Leukocytes, UA: NEGATIVE
Nitrite, UA: NEGATIVE
POC PROTEIN,UA: NEGATIVE
Spec Grav, UA: >=1.03 — AB (ref 1.010–1.025)
Urobilinogen, UA: 0.2 E.U./dL
pH, UA: 6.5 (ref 5.0–8.0)

## 2021-05-28 LAB — GLUCOSE, POCT (MANUAL RESULT ENTRY): POC Glucose: 106 mg/dl — AB (ref 70–99)

## 2021-05-28 MED ORDER — IBUPROFEN 800 MG PO TABS
800.0000 mg | ORAL_TABLET | Freq: Two times a day (BID) | ORAL | 2 refills | Status: DC | PRN
  Filled 2021-05-28: qty 30, 15d supply, fill #0
  Filled 2021-06-19: qty 30, 15d supply, fill #1
  Filled 2021-08-26: qty 30, 15d supply, fill #2

## 2021-05-28 MED ORDER — HYDROCHLOROTHIAZIDE 25 MG PO TABS
25.0000 mg | ORAL_TABLET | Freq: Every day | ORAL | 1 refills | Status: DC
  Filled 2021-05-28: qty 30, 30d supply, fill #0
  Filled 2021-06-19: qty 30, 30d supply, fill #1
  Filled 2021-08-26: qty 30, 30d supply, fill #2
  Filled 2021-11-22: qty 30, 30d supply, fill #3

## 2021-05-28 MED ORDER — TRULICITY 0.75 MG/0.5ML ~~LOC~~ SOAJ
0.7500 mg | SUBCUTANEOUS | 6 refills | Status: DC
  Filled 2021-05-28: qty 2, 28d supply, fill #0
  Filled 2021-06-19: qty 2, 28d supply, fill #1
  Filled 2021-07-22: qty 2, 28d supply, fill #2
  Filled 2021-08-26: qty 2, 28d supply, fill #3
  Filled 2021-09-11 – 2021-09-18 (×2): qty 2, 28d supply, fill #4
  Filled 2021-10-18: qty 2, 28d supply, fill #5
  Filled 2021-11-22: qty 2, 28d supply, fill #6
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
## 2021-05-28 NOTE — Progress Notes (Signed)
Possible bladder infection. Burning while urination,vaginal itching and pelvic pain.  Has been taking more of her metformin to ease the vaginal itching.

## 2021-05-28 NOTE — Patient Instructions (Signed)
Dulaglutide Injection Qu es este medicamento? La DULAGLUTIDA controla los niveles de azcar en la sangre en personas con diabetes tipo 2. Se usa con cambios en el estilo de vida tales como dieta y ejercicio fsico. Podra disminuir el riesgo de problemas que necesiten tratamiento en el hospital. Estos problemas incluyen ataque cardiaco o accidente cerebrovascular. Este medicamento puede ser utilizado para otros usos; si tiene alguna pregunta consulte con su proveedor de atencin mdica o con su farmacutico. MARCAS COMUNES: Trulicity Qu le debo informar a mi profesional de la salud antes de tomar este medicamento? Necesitan saber si usted presenta alguno de los siguientes problemas o situaciones: tumores endocrinos (neoplasia endcrina mltiple tipo II) o si alguien en su familiar tuvo esos tumores enfermedad ocular, problemas de la visin antecedentes de pancreatitis enfermedad renal enfermedad heptica problemas estomacales o intestinales cncer de tiroides o si alguien en su familia tuvo cncer de tiroides una reaccin alrgica o inusual a la dulaglutida, a otros medicamentos, alimentos, colorantes o conservantes si est embarazada o buscando quedar embarazada si est amamantando a un beb Cmo debo utilizar este medicamento? Este medicamento se inyecta debajo de la piel. Le ensearn cmo prepararlo y administrarlo. selo segn las instrucciones en la etiqueta el mismo da todas las semanas. NO presione repetidamente el inyector. Siga usndolo a menos que su proveedor de atencin mdica le indique dejar de hacerlo. Si usa este medicamento con insulina, debe inyectar este medicamento y la insulina por separado. No los mezcle. No se aplique una inyeccin al lado de la otra. Cambie (rote) los sitios de inyeccin con cada inyeccin. Este frmaco viene con INSTRUCCIONES DE USO. Pdale a su farmacutico que le indique cmo usar este medicamento. Lea la informacin atentamente. Hable con su  farmacutico o su proveedor de atencin mdica si tiene alguna pregunta. Es importante que deseche las agujas y las jeringas usadas en un recipiente resistente a los pinchazos. No las deseche en la basura. Si no tiene un recipiente resistente a los pinchazos, llame a su farmacutico o proveedor de atencin de la salud para obtenerlo. Su farmacutico le dar una Gua del medicamento especial (MedGuide, nombre en ingls) con cada receta y en cada ocasin que la vuelva a surtir. Asegrese de leer esta informacin cada vez cuidadosamente. Hable con su proveedor de atencin mdica sobre el uso de este medicamento en nios. Puede requerir atencin especial. Sobredosis: Pngase en contacto inmediatamente con un centro toxicolgico o una sala de urgencia si usted cree que haya tomado demasiado medicamento. ATENCIN: Este medicamento es solo para usted. No comparta este medicamento con nadie. Qu sucede si me olvido de una dosis? Si olvida una dosis, adminstrela lo antes posible, a menos que hayan pasado ms de 3 das. Si han pasado ms de 3 das desde el momento de administracin habitual, omita la dosis que se olvid. Administre la prxima dosis a la hora habitual. Qu puede interactuar con este medicamento? otros medicamentos para la diabetes Muchos medicamentos pueden causar cambios en el nivel de azcar en la sangre. Estos incluyen: bebidas con alcohol medicamentos antivirales para el VIH o SIDA aspirina y medicamentos tipo aspirina ciertos medicamentos para la presin arterial, enfermedad cardiaca y frecuencia cardiaca irregular cromo diurticos hormonas femeninas, tales como estrgenos o progestinas, pldoras anticonceptivas fenofibrato gemfibrozil isoniazida lanreotida hormonas masculinas o esteroides anablicos IMAO, tales como Carbex, Eldepryl, Marplan, Nardil y Parnate medicamentos para alergias, asma, resfros o tos medicamentos para la depresin, ansiedad o trastornos  psicticos medicamentos para bajar de peso niacina nicotina AINE,   medicamentos para el dolor y la inflamacin, tales como ibuprofeno o naproxeno octreotida pasireotida pentamidina fenitona probenecid antibiticos del grupo de las quinolonas, tales como ciprofloxacino, levofloxacino y ofloxacino algunos suplementos dietticos a base de hierbas medicamentos esteroideos, tales como la prednisona o la cortisona sulfametoxasol; trimetoprima hormonas tiroideas Algunos medicamentos pueden ocultar los sntomas de advertencia de niveles bajos de azcar en la sangre (hipoglucemia). Es posible que deba monitorear ms atentamente su nivel de azcar en la sangre si est tomando uno de estos medicamentos. Estos medicamentos incluyen: betabloqueadores, que con frecuencia se usan para la presin arterial alta o problemas cardiacos (algunos ejemplos son atenolol, metoprolol y propranolol) clonidina guanetidina reserpina Puede ser que esta lista no menciona todas las posibles interacciones. Informe a su profesional de la salud de todos los productos a base de hierbas, medicamentos de venta libre o suplementos nutritivos que est tomando. Si usted fuma, consume bebidas alcohlicas o si utiliza drogas ilegales, indqueselo tambin a su profesional de la salud. Algunas sustancias pueden interactuar con su medicamento. A qu debo estar atento al usar este medicamento? Visite a su proveedor de atencin mdica para que revise regularmente su evolucin. Consulte con su proveedor de atencin mdica si tiene diarrea grave, nuseas y vmitos, o sudoracin intensa. La prdida de demasiado lquido corporal puede hacer que sea peligroso usar este medicamento. Se monitorizar una prueba llamada Hemoglobina A1C (A1C). Es un anlisis de sangre sencillo. Mide su control del nivel de azcar en la sangre durante los ltimos 2 a 3 meses. Se le realizar esta prueba cada 3 a 6 meses. Aprenda a revisar su nivel de azcar en la  sangre. Conozca los sntomas del nivel bajo y alto de azcar en la sangre y cmo controlarlos. Siempre lleve con usted una fuente rpida de azcar por si tiene sntomas de nivel bajo de azcar en la sangre. Algunos ejemplos incluyen caramelos duros de azcar o tabletas de glucosa. Asegrese de que otras personas sepan que usted se puede ahogar si come o bebe cuando presenta sntomas graves de nivel bajo de azcar en la sangre, como convulsiones o prdida del conocimiento. Debe obtener ayuda mdica de inmediato. Informe a su proveedor de atencin mdica si tiene niveles altos de azcar en la sangre. Es posible que deba cambiar la dosis de su medicamento. Si est enfermo o hace ms ejercicio que lo habitual, es posible que necesite cambiar la dosis de su medicamento. No saltee comidas. Pregunte a su proveedor de atencin mdica si debe evitar el alcohol. Muchos productos de venta libre para la tos y el resfro contienen azcar o alcohol. Estos pueden afectar los niveles de azcar en la sangre. Los inyectores nunca deben compartirse. Incluso si se cambia la aguja, al compartir se pueden contagiar virus como la hepatitis o el VIH. Use un brazalete o una cadena de identificacin mdica. Lleve consigo una tarjeta que describa su afeccin. Incluya en la tarjeta una lista de los medicamentos y las dosis que usa. Qu efectos secundarios puedo tener al utilizar este medicamento? Efectos secundarios que debe informar a su mdico o a su profesional de la salud tan pronto como sea posible: reacciones alrgicas (erupcin cutnea, comezn/picazn o urticaria; hinchazn de la cara, los labios o la lengua) cambios en la visin diarrea que contina o es grave infeccin (fiebre, escalofros, tos, dolor de garganta, dolor o dificultad para orinar) lesin renal (dificultad para orinar o cambios en la cantidad de orina) niveles bajos de azcar en la sangre (ansiedad; confusin; mareos; aumento del apetito;   debilidad o  cansancio inusuales; aumento de la sudoracin; temblores; piel fra y sudorosa; irritabilidad; dolor de cabeza; visin borrosa; frecuencia cardiaca rpida; prdida del conocimiento) bulto o hinchazn en el cuello problemas para respirar problemas para tragar dolor o malestar estomacal inusual vmito Efectos secundarios que generalmente no requieren atencin mdica (infrmelos a su mdico o a su profesional de la salud si persisten o si son molestos): falta o prdida del apetito nuseas dolor, enrojecimiento o irritacin en el lugar de la inyeccin Puede ser que esta lista no menciona todos los posibles efectos secundarios. Comunquese a su mdico por asesoramiento mdico sobre los efectos secundarios. Usted puede informar los efectos secundarios a la FDA por telfono al 1-800-FDA-1088. Dnde debo guardar mi medicina? Mantenga fuera del alcance de nios y mascotas. Refrigeracin (preferido): Guarde los inyectores sin abrir en el refrigerador a una temperatura de entre 2 y 8 grados Celsius (entre 36 y 46 grados Fahrenheit). Mantenga en el envase original hasta que est listo para usarlo. No congele ni utilice si el medicamento ha estado congelado. Proteja de la luz. Deseche todo el medicamento que no haya utilizado despus de la fecha de vencimiento en la etiqueta. Temperatura ambiente: el inyector se puede guardar a temperatura ambiente inferior a 30 grados Celsius (86 grados Fahrenheit) por hasta un total de 14 das, si fuera necesario. Proteja de la luz. Evite exponer al calor extremo. Si se almacena a temperatura ambiente, deseche todo el medicamento sin usar despus de 14 das o despus de la fecha de vencimiento, lo que suceda primero. Para desechar el medicamento que ya no necesite o que est vencido: Lleve el medicamento a un programa de recuperacin de medicamentos. Consulte con su farmacia o con una entidad reguladora para encontrar un lugar donde llevarlo. Si no puede devolver el  medicamento, pregntele a su farmacutico o proveedor de atencin mdica cmo desecharlo de manera segura. ATENCIN: Este folleto es un resumen. Puede ser que no cubra toda la posible informacin. Si usted tiene preguntas acerca de esta medicina, consulte con su mdico, su farmacutico o su profesional de la salud.  2022 Elsevier/Gold Standard (2020-10-31 00:00:00)  

## 2021-05-29 LAB — CERVICOVAGINAL ANCILLARY ONLY
Bacterial Vaginitis (gardnerella): NEGATIVE
Candida Glabrata: NEGATIVE
Candida Vaginitis: NEGATIVE
Chlamydia: NEGATIVE
Comment: NEGATIVE
Comment: NEGATIVE
Comment: NEGATIVE
Comment: NEGATIVE
Comment: NEGATIVE
Comment: NORMAL
Neisseria Gonorrhea: NEGATIVE
Trichomonas: NEGATIVE

## 2021-05-30 ENCOUNTER — Telehealth: Payer: Self-pay

## 2021-05-30 NOTE — Telephone Encounter (Signed)
-----   Message from [name], MD sent at 05/30/2021  8:36 AM EST ----- Please inform her that her vaginal swab was negative.

## 2021-05-30 NOTE — Telephone Encounter (Signed)
Patient name and DOB has been verified Patient was informed of lab results. Patient had no questions.  

## 2021-05-31 ENCOUNTER — Other Ambulatory Visit: Payer: Self-pay

## 2021-06-04 ENCOUNTER — Ambulatory Visit: Payer: Self-pay | Admitting: Pharmacist

## 2021-06-13 ENCOUNTER — Ambulatory Visit: Payer: Self-pay

## 2021-06-13 NOTE — Telephone Encounter (Signed)
? ?  Chief Complaint: Carpel tunnel pain to both hands.Uses her hands "a lot on her job." ?Symptoms: Pain and swelling in hands Started in December ?Frequency: December ?Pertinent Negatives: Patient denies  ?Disposition: [] ED /[] Urgent Care (no appt availability in office) / [] Appointment(In office/virtual)/ []  [facility]/ [] Home Care/ [] Refused Recommended Disposition /[] [facility]/ []  Follow-up with PCP ?Additional Notes: Pt. Asking for pain medication. Please advise.  ? ?Answer Assessment - Initial Assessment Questions ?1. ONSET: "When did the pain start?" ?    Started December ?2. LOCATION: "Where is the pain located?" ?    Left is worse ?3. PAIN: "How bad is the pain?" (Scale 1-10; or mild, moderate, severe) ?  - MILD (1-3): doesn't interfere with normal activities ?  - MODERATE (4-7): interferes with normal activities (e.g., work or school) or awakens from sleep ?  - SEVERE (8-10): excruciating pain, unable to use hand at all ?    Severe ?4. WORK OR EXERCISE: "Has there been any recent work or exercise that involved this part (i.e., hand or wrist) of the body?" ?    No ?5. CAUSE: "What do you think is causing the pain?" ?    Unsure ?6. AGGRAVATING FACTORS: "What makes the pain worse?" (e.g., using computer) ?    No ?7. OTHER SYMPTOMS: "Do you have any other symptoms?" (e.g., neck pain, swelling, rash, numbness, fever) ?    Swelling ?8. PREGNANCY: "Is there any chance you are pregnant?" "When was your last menstrual period?" ?    No ? ?Protocols used: Hand and Wrist Pain-A-AH ? ?

## 2021-06-19 ENCOUNTER — Other Ambulatory Visit: Payer: Self-pay

## 2021-06-19 ENCOUNTER — Other Ambulatory Visit: Payer: Self-pay | Admitting: Physician Assistant

## 2021-06-19 DIAGNOSIS — J302 Other seasonal allergic rhinitis: Secondary | ICD-10-CM

## 2021-06-19 MED ORDER — CETIRIZINE HCL 10 MG PO TABS
10.0000 mg | ORAL_TABLET | Freq: Every day | ORAL | 2 refills | Status: DC
  Filled 2021-06-19: qty 30, 30d supply, fill #0
  Filled 2021-08-26: qty 30, 30d supply, fill #1
  Filled 2021-10-18: qty 30, 30d supply, fill #2

## 2021-06-21 ENCOUNTER — Other Ambulatory Visit: Payer: Self-pay

## 2021-06-25 ENCOUNTER — Other Ambulatory Visit: Payer: Self-pay

## 2021-07-10 ENCOUNTER — Ambulatory Visit (INDEPENDENT_AMBULATORY_CARE_PROVIDER_SITE_OTHER): Payer: Self-pay | Admitting: Physician Assistant

## 2021-07-10 ENCOUNTER — Encounter: Payer: Self-pay | Admitting: Physician Assistant

## 2021-07-10 ENCOUNTER — Other Ambulatory Visit: Payer: Self-pay

## 2021-07-10 VITALS — BP 158/92 | HR 78 | Temp 98.2°F | Resp 18 | Ht 62.0 in | Wt 368.0 lb

## 2021-07-10 DIAGNOSIS — I10 Essential (primary) hypertension: Secondary | ICD-10-CM

## 2021-07-10 DIAGNOSIS — M25531 Pain in right wrist: Secondary | ICD-10-CM

## 2021-07-10 DIAGNOSIS — Z6841 Body Mass Index (BMI) 40.0 and over, adult: Secondary | ICD-10-CM

## 2021-07-10 DIAGNOSIS — M545 Low back pain, unspecified: Secondary | ICD-10-CM

## 2021-07-10 DIAGNOSIS — M67431 Ganglion, right wrist: Secondary | ICD-10-CM

## 2021-07-10 DIAGNOSIS — G8929 Other chronic pain: Secondary | ICD-10-CM

## 2021-07-10 DIAGNOSIS — M25532 Pain in left wrist: Secondary | ICD-10-CM

## 2021-07-10 MED ORDER — MELOXICAM 15 MG PO TABS
15.0000 mg | ORAL_TABLET | Freq: Every day | ORAL | 0 refills | Status: DC
  Filled 2021-07-10: qty 30, 30d supply, fill #0
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
## 2021-07-10 NOTE — Patient Instructions (Signed)
Instead of taking ibuprofen, you will start taking Mobic 15 mg once daily in the morning.  This should offer you some relief from your pain.  I do encourage you to work on weight loss, this will also help give you some relief from your back pain. ? ?I encourage you to continue wearing your braces at night regardless if you feel that they are offering relief. ? ?We will call you with the results of your ultrasound. ?Roney Jaffe, PA-C ?Physician Assistant ?[facility] ?[email or web address] ? ? ?Recuento de calor?as para bajar de peso ?Calorie Counting for Weight Loss ?Las calor?as son unidades de energ?a. El cuerpo necesita una cierta cantidad de calor?as de los alimentos para que lo ayuden a funcionar durante todo el d?a. Cuando se comen o beben m?s calor?as de las que el cuerpo necesita, este acumula las calor?as adicionales mayormente como grasa. Cuando se comen o beben menos calor?as de las que el cuerpo [place], este quema grasa para obtener la energ?a que necesita. ?El recuento de calor?as es Manufacturing engineer de la cantidad de calor?as que se comen y beben cada d?a. El recuento de calor?as puede ser de ayuda si necesita perder peso. Si come menos calor?as de las que el cuerpo [place], deber?a bajar [employer]. Preg?ntele al m?dico cu?l es un peso sano para usted. ?Para que el recuento de calor?as funcione, usted tendr? que ingerir la cantidad de calor?as adecuadas cada d?a, para bajar una cantidad de peso saludable por semana. Un nutricionista puede ayudar a determinar la cantidad de calor?as que usted necesita por d?a y sugerirle formas de Barista su objetivo cal?rico. ?Una cantidad de peso saludable para bajar cada semana suele ser entre 1 y 2 libras (0.5 a 0.9 kg). Esto habitualmente significa que su ingesta diaria de calor?as se deber?a reducir en unas 500 a 750 calor?as. ?Ingerir de 1200 a 1500 calor?as por d?a puede ayudar a la mayor?a de las mujeres a  Publishing copy de [place]. ?Ingerir de 1500 a 1800 calor?as por d?a puede ayudar a la mayor?a de los hombres a Publishing copy de [place]. ??Qu? debo saber acerca del recuento de calor?as? ?Trabaje con el m?dico o el nutricionista para determinar cu?ntas calor?as debe recibir cada d?a. A fin de alcanzar su objetivo diario de calor?as, tendr? que: ?Averiguar cu?ntas calor?as hay en cada alimento que le gustar?a comer. Intente hacerlo antes de comer. ?Decidir la cantidad que puede comer del alimento. ?Llevar un registro de los alimentos. Para esto, anote lo que comi? y cu?ntas calor?as ten?a. ?Para perder peso con ?xito, es importante equilibrar el recuento de calor?as con un estilo de vida saludable que incluya actividad f?sica de forma regular. ??D?nde encuentro informaci?n sobre las calor?as? ?Es posible Veterinary surgeon cantidad de calor?as que contiene un alimento en la etiqueta de informaci?n nutricional. Si un alimento no tiene una etiqueta de informaci?n nutricional, intente buscar las calor?as en Internet o pida ayuda al nutricionista. ?Recuerde que las calor?as se calculan por porci?n. Si opta por comer m?s de una porci?n de un alimento, tendr? Programmer, systems?as de una porci?n por la cantidad de porciones que planea comer. Por ejemplo, la etiqueta de un envase de pan puede decir que el tama?o de una porci?n es 1 rodaja, y que una porci?n tiene 90 calor?as. Si come 1 rodaja, habr? comido 90 calor?as. Si come 2 rodajas, habr? comido 180 calor?as. ??C?mo llevo un registro de comidas? ?Despu?s de cada vez que coma, anote lo siguiente en el registro de alimentos lo  antes posible: ?Lo que comi?. Aseg?rese de incluir los aderezos, las salsas y otros extras en los alimentos. ?La cantidad que comi?Vivia Budge. Esto se puede medir en tazas, onzas o cantidad de alimentos. ?Cu?ntas calor?as hab?a en cada alimento y en cada bebida. ?La cantidad total de calor?as en la comida que tom?Marland Kitchen. ?Tenga a Scientific laboratory technicianmano el registro de alimentos, por ejemplo, en un  anotador de bolsillo o utilice una aplicaci?n o sitio web en el tel?fono m?vil. Algunos programas calcular?n las calor?as por usted y Therapist, musicle mostrar?n la cantidad de calor?as que le quedan para llegar al objetivo diario. ??Cu?les son algunos consejos para controlar las porciones? ?Sepa cu?ntas calor?as hay en una porci?n. Esto lo ayudar? a saber cu?ntas porciones de un alimento determinado puede comer. ?Use una taza medidora para medir los tama?os de las porciones. Tambi?n puede intentar pesar las porciones en una balanza de cocina. Con el tiempo, podr? hacer un c?lculo estimativo de los tama?os de las porciones de International Paperalgunos alimentos. ?Dedique tiempo a poner porciones de diferentes alimentos en sus platos, tazones y tazas predilectos, a fin de saber c?mo se ve una porci?n. ?Intente no comer directamente de un envase de alimentos, por ejemplo, de una bolsa o una caja. Comer directamente del envase dificulta ver cu?nto est? comiendo y puede conducir a Actuarycomer en exceso. Ponga la cantidad Wal-Martque le gustar?a comer en una taza o un plato, a fin de asegurarse de que est? comiendo la porci?n correcta. ?Use platos, vasos y tazones m?s peque?os para medir porciones m?s peque?as y Automotive engineerevitar no comer en exceso. ?Intente no realizar varias tareas al Arrow Electronicsmismo tiempo. Por ejemplo, evite mirar televisi?n o usar la Assurantcomputadora mientras come. Si es la hora de comer, si?ntese a la mesa y disfrute de Chemical engineerla comida. Esto lo ayudar? a Public house managerreconocer cu?ndo est? satisfecho. Tambi?n le permitir? estar m?s consciente de qu? come y cu?nto come. ?Consejos para seguir este plan ?Al leer las etiquetas de los alimentos ?Controle el recuento de calor?as en comparaci?n con el tama?o de la porci?n. El tama?o de la porci?n puede ser m?s peque?o de lo que suele comer. ?Verifique la fuente de las calor?as. Intente elegir alimentos ricos en prote?nas, fibras y vitaminas, y bajos en grasas saturadas, grasas trans y [place]asodio. ?Al ir de compras ?Lea las etiquetas nutricionales  cuando compre. Esto lo ayudar? a tomar decisiones saludables sobre qu? alimentos comprar. ?Preste atenci?n a las etiquetas nutricionales de alimentos bajos en grasas o sin grasas. Estos alimentos a veces tienen la misma cantidad de calor?as o m?s calor?as que las versiones ricas en grasas. Con frecuencia, tambi?n tienen agregados de az?car, almid?n o sal, para darles el sabor que fue eliminado con las grasas. ?Leanna SatoHaga una lista de compras con los alimentos que tienen un menor contenido de calor?as y resp?tela. ?Al cocinar ?Intente cocinar sus alimentos preferidos de una manera m?s saludable. Por ejemplo, pruebe hornear en vez de fre?r. ?Utilice productos l?cteos descremados. ?Planificaci?n de las comidas ?Utilice m?s frutas y verduras. La mitad de su plato debe ser de frutas y verduras. ?Incluya prote?nas magras, como pollo, pavo y pescado. ?Estilo de vida ?Cada semana, trate de hacer una de las siguientes cosas: ?150 minutos de ejercicio moderado, como caminar. ?75 minutos de ejercicio en?rgico, como correr. ?Informaci?n general ?Sepa cu?ntas calor?as tienen los ConocoPhillipsalimentos que come con m?s frecuencia. Esto le ayudar? a contar las calor?as m?s r?pidamente. ?Encuentre un m?todo para controlar las calor?as que funcione para usted. Sea creativo. Pruebe aplicaciones o programas distintos, si llevar un registro de  las calor?as no funciona para usted. ??Qu? alimentos debo consumir? ? ?Consuma alimentos nutritivos. Es mejor comer un alimento nutritivo, de alto contenido cal?rico, como un aguacate, que uno con pocos nutrientes, como una bolsa de patatas fritas. ?Use sus calor?as en alimentos y bebidas que lo sacien y no lo dejen con apetito apenas termina de comer. ?Ejemplos de alimentos que lo sacian son los frutos secos y Civil engineer, contracting de frutos secos, verduras, prote?nas magras y Forensic scientist con alto contenido de Research scientist (life sciences) como los cereales integrales. Los alimentos con alto contenido de [place] son aquellos que tienen m?s de 5 g de  fibra por porci?n. ?Preste atenci?n a las calor?as en las bebidas. Las bebidas de bajas calor?as incluyen agua y refrescos sin az?car. ?Es posible que los productos que se enumeran m?s arriba no constituyan una l

## 2021-07-11 ENCOUNTER — Ambulatory Visit: Payer: Self-pay

## 2021-07-11 DIAGNOSIS — M67431 Ganglion, right wrist: Secondary | ICD-10-CM | POA: Insufficient documentation

## 2021-07-11 DIAGNOSIS — I10 Essential (primary) hypertension: Secondary | ICD-10-CM | POA: Insufficient documentation

## 2021-07-11 DIAGNOSIS — M25531 Pain in right wrist: Secondary | ICD-10-CM | POA: Insufficient documentation

## 2021-07-11 DIAGNOSIS — M545 Low back pain, unspecified: Secondary | ICD-10-CM | POA: Insufficient documentation

## 2021-07-11 NOTE — Telephone Encounter (Signed)
?  Chief Complaint: pelvic pain ?Symptoms: pelvic pain that comes and goes with urination, back pain ?Frequency: more than 1 week ?Pertinent Negatives: NA ?Disposition: [] ED /[] Urgent Care (no appt availability in office) / [x] Appointment(In office/virtual)/ []  [facility]/ [] Home Care/ [] Refused Recommended Disposition /[] [facility]/ []  Follow-up with PCP ?Additional Notes: pt went to [facility] yesterday was per pt was told she needed to follow up with Dr. . I advised her Dr was booked out until May. Scheduled first available appt with , PA and added pt to waiting list. Pt wanted to see Dr . I advised her I will send message and see if anything can be worked in and if so we could give her a call back.  ? ?Reason for Disposition ? [1] MILD (e.g., does not interfere with normal activities) pelvic pain AND [2] pain comes and goes (cramps) AND [3] present > 48 hours ? ?Answer Assessment - Initial Assessment Questions ?1. LOCATION: "Where does it hurt?"  ?    Pelvic pain  ?2. RADIATION: "Does the pain shoot anywhere else?" (e.g., lower back, groin, thighs) ?    Back  ?3. ONSET: "When did the pain begin?" (e.g., minutes, hours or days ago)  ?    More than 1 week  ?5. PATTERN "Does the pain come and go, or is it constant?" ?   - If constant: "Is it getting better, staying the same, or worsening?"  ?    (Note: Constant means the pain never goes away completely; most serious pain is constant and gets worse over time)  ?   - If intermittent: "How long does it last?" "Do you have pain now?" ?    (Note: Intermittent means the pain goes away completely between bouts) ?    Comes and goes ?6. SEVERITY: "How bad is the pain?"  (e.g., Scale 1-10; mild, moderate, or severe) ?  - MILD (1-3): doesn't interfere with normal activities, area soft and not tender to touch  ?  - MODERATE (4-7): interferes with normal activities or awakens from sleep, abdomen tender to touch  ?  - SEVERE (8-10):  excruciating pain, doubled over, unable to do any normal activities  ?    10 ?7. RECURRENT SYMPTOM: "Have you ever had this type of pelvic pain before?" If Yes, ask: "When was the last time?" and "What happened that time?"  ?    UTI ?10. OTHER SYMPTOMS: "Has there been any other symptoms?" (e.g., fever, constipation, diarrhea, urine problems, vaginal bleeding, vaginal discharge, or vomiting?" ?      Discharge, pain with urination ? ?Protocols used: Pelvic Pain - Female-A-AH ? ?

## 2021-07-11 NOTE — Telephone Encounter (Signed)
Since she had a visit yesterday,I would recommend she stick to the plan the PA discussed with her. She is currently on the wait list for a follow up to see me, when something opens up I will see her for a follow up. Thanks. ?

## 2021-07-12 ENCOUNTER — Other Ambulatory Visit: Payer: Self-pay

## 2021-07-12 ENCOUNTER — Ambulatory Visit: Admission: EM | Admit: 2021-07-12 | Discharge: 2021-07-12 | Discharge status: Against Medical Advice | Payer: Self-pay

## 2021-07-15 ENCOUNTER — Encounter: Payer: Self-pay | Admitting: Physician Assistant

## 2021-07-15 ENCOUNTER — Other Ambulatory Visit (HOSPITAL_COMMUNITY)
Admission: RE | Admit: 2021-07-15 | Discharge: 2021-07-15 | Disposition: A | Discharge status: Home / Self Care | Payer: Self-pay | Source: Ambulatory Visit | Attending: Physician Assistant | Admitting: Physician Assistant

## 2021-07-15 ENCOUNTER — Telehealth: Payer: Self-pay | Admitting: Family Medicine

## 2021-07-15 ENCOUNTER — Other Ambulatory Visit: Payer: Self-pay

## 2021-07-15 ENCOUNTER — Ambulatory Visit (INDEPENDENT_AMBULATORY_CARE_PROVIDER_SITE_OTHER): Payer: Self-pay | Admitting: Physician Assistant

## 2021-07-15 VITALS — BP 135/88 | HR 100 | Temp 98.9°F | Resp 18 | Ht 62.0 in | Wt 366.0 lb

## 2021-07-15 DIAGNOSIS — M545 Low back pain, unspecified: Secondary | ICD-10-CM

## 2021-07-15 DIAGNOSIS — G8929 Other chronic pain: Secondary | ICD-10-CM

## 2021-07-15 DIAGNOSIS — B3731 Acute candidiasis of vulva and vagina: Secondary | ICD-10-CM

## 2021-07-15 DIAGNOSIS — Z6841 Body Mass Index (BMI) 40.0 and over, adult: Secondary | ICD-10-CM

## 2021-07-15 DIAGNOSIS — N898 Other specified noninflammatory disorders of vagina: Secondary | ICD-10-CM

## 2021-07-15 DIAGNOSIS — N949 Unspecified condition associated with female genital organs and menstrual cycle: Secondary | ICD-10-CM | POA: Insufficient documentation

## 2021-07-15 DIAGNOSIS — Z3202 Encounter for pregnancy test, result negative: Secondary | ICD-10-CM

## 2021-07-15 LAB — POCT URINALYSIS DIP (CLINITEK)
Bilirubin, UA: NEGATIVE
Blood, UA: NEGATIVE
Glucose, UA: NEGATIVE mg/dL
Ketones, POC UA: NEGATIVE mg/dL
Leukocytes, UA: NEGATIVE
Nitrite, UA: NEGATIVE
POC PROTEIN,UA: NEGATIVE
Spec Grav, UA: 1.025
Urobilinogen, UA: 0.2 U/dL
pH, UA: 6.5

## 2021-07-15 LAB — POCT URINE PREGNANCY: Preg Test, Ur: NEGATIVE

## 2021-07-15 MED ORDER — FLUCONAZOLE 150 MG PO TABS
150.0000 mg | ORAL_TABLET | Freq: Once | ORAL | 0 refills | Status: AC
  Filled 2021-07-15: qty 1, 1d supply, fill #0
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
## 2021-07-15 NOTE — Telephone Encounter (Signed)
Pt is needing work note ?

## 2021-07-15 NOTE — Telephone Encounter (Signed)
WORK NOTE WAS PROVIDED DURING PATIENTS VISIT X2.

## 2021-07-15 NOTE — Patient Instructions (Signed)
You will take Diflucan once.  We will call you with today's lab results. ? ?Kennieth Rad, PA-C ?Physician Assistant ?[facility] ?[email or web address] ? ? ?Infecci?n mic?tica vaginal en mujeres adultas ?Vaginal Yeast Infection, Adult ?La infecci?n mic?tica vaginal es una afecci?n que causa secreci?n vaginal y tambi?n dolor, hinchaz?n y enrojecimiento (inflamaci?n) de la vagina. Esta es una afecci?n frecuente. Algunas mujeres contraen esta infecci?n con frecuencia. ??Cu?les son las causas? ?La causa de la infecci?n es un cambio en el equilibrio normal de las levaduras (c?ndida) y las bacterias normales que viven en la vagina. Esta alteraci?n deriva en el crecimiento excesivo de las levaduras, lo que causa la inflamaci?n. ??Qu? [place]? ?Es m?s probable que esta afecci?n ocurra en las mujeres que: ?Toman antibi?ticos. ?Tienen diabetes. ?Toman anticonceptivos orales. ?Est?n embarazadas. ?Se hacen duchas vaginales con frecuencia. ?Tienen debilitado el sistema de defensa del organismo (sistema inmunitario). ?Han estado tomando medicamentos con corticoesteroides durante [employer]. ?Usan ropa ajustada con frecuencia. ??Cu?les son los signos o s?ntomas? ?Los s?ntomas de esta afecci?n incluyen: ?Secreci?n vaginal blanca, cremosa y espesa. ?Hinchaz?n, picaz?n, enrojecimiento e irritaci?n de la vagina. Los labios de la vagina (labios de la vulva) tambi?n pueden infectarse. ?Dolor o ardor al Garment/textile technologist. ?[place]. ??C?mo se diagnostica? ?Esta afecci?n se diagnostica en funci?n de lo siguiente: ?Sus antecedentes m?dicos. ?Un examen f?sico. ?Un examen p?lvico. El m?dico examinar? Truddie Coco de la secreci?n vaginal con un microscopio. Probablemente el m?dico env?e esta muestra al laboratorio para analizarla y confirmar el diagn?stico. ??C?mo se trata? ?Esta afecci?n se trata con medicamentos. Los Dynegy pueden ser recetados o de  venta libre. Podr?n indicarle que use uno o m?s de lo siguiente: ?Medicamentos que se toman por boca (orales). ?Medicamentos que se aplican como una crema (t?picos). ?Medicamentos que se colocan directamente en la vagina (?vulos vaginales). ?Siga estas indicaciones en su casa: ?Use o apl?quese los medicamentos de venta libre y los recetados solamente como se lo haya indicado el m?dico. ?No use tampones hasta que el m?dico la autorice. ?No tenga sexo hasta que la infecci?n haya desaparecido. El sexo puede prolongar o empeorar sus s?ntomas de infecci?n. Preg?ntele al m?dico cu?ndo es seguro reanudar la actividad sexual. ?Concurra a Amo. Esto es importante. ??C?mo se evita? ? ?No use ropa ajustada, como pantimedias o pantalones ajustados. ?Use ropa interior de algod?n, que permite el paso del aire. ?No use duchas vaginales, jab?n perfumado, cremas ni talcos. ?Cuando vaya al ba?o, siempre higien?cese de adelante hacia atr?s. ?Si tiene diabetes, mantenga bajo control los niveles de Paramedic. ?Preg?ntele al m?dico otras maneras de prevenir las infecciones por levaduras. ?Comun?quese con un m?dico si: ?Tiene fiebre. ?Los s?ntomas desaparecen y luego vuelven a Arts administrator. ?Los s?ntomas no mejoran con Dispensing optician. ?Sus s?ntomas empeoran. ?Aparecen nuevos s?ntomas. ?Aparecen ampollas alrededor o adentro de la vagina. ?Le Magazine features editor de la vagina y no est? menstruando. ?Siente dolor en el abdomen. ?Resumen ?La infecci?n mic?tica vaginal es una afecci?n que causa secreci?n y tambi?n dolor, hinchaz?n y enrojecimiento (inflamaci?n) de la vagina. ?Esta afecci?n se trata con medicamentos. Los Dynegy pueden ser recetados o de venta libre. ?Use o apl?quese los medicamentos de venta libre y los recetados solamente como se lo haya indicado el m?dico. ?No se haga duchas vaginales. Reanude la actividad sexual u use tampones como se lo haya indicado el m?dico. ?Comun?quese con un m?dico si  los s?ntomas no mejoran con el tratamiento o si los s?ntomas  desaparecen y luego regresan. ?Esta informaci?n no tiene Marine scientist el consejo del m?dico. Aseg?rese de hacerle al m?dico cualquier pregunta que tenga. ?Document Revised: 07/17/2020 Document Reviewed: 07/17/2020 ?Elsevier Patient Education ? [place]. ? ?

## 2021-07-15 NOTE — Telephone Encounter (Signed)
Called pt made aware of MD message  and instructions.  ?

## 2021-07-15 NOTE — Progress Notes (Signed)
Patient reports vaginal discomfort beginning a few days ago. ? ?

## 2021-07-15 NOTE — Telephone Encounter (Signed)
Copied from CRM [id number]. Topic: General - Other >> Jul 11, 2021  4:26 PM Glean Salen wrote: Reason for CRM:pt called in states needs note for work,  with no restrictions and excusing her from work from now until Tuesday.

## 2021-07-18 LAB — CERVICOVAGINAL ANCILLARY ONLY
Bacterial Vaginitis (gardnerella): NEGATIVE
Candida Glabrata: NEGATIVE
Candida Vaginitis: NEGATIVE
Chlamydia: NEGATIVE
Comment: NEGATIVE
Comment: NEGATIVE
Comment: NEGATIVE
Comment: NEGATIVE
Comment: NEGATIVE
Comment: NORMAL
Neisseria Gonorrhea: NEGATIVE
Trichomonas: NEGATIVE

## 2021-07-22 ENCOUNTER — Other Ambulatory Visit: Payer: Self-pay

## 2021-07-23 ENCOUNTER — Telehealth: Payer: Self-pay | Admitting: *Deleted

## 2021-07-23 NOTE — Telephone Encounter (Signed)
-----   Message from Kennieth Rad, [place] sent at 07/18/2021  2:57 PM EDT ----- ?Please call patient and let her know that her vaginal swab was negative for gonorrhea, chlamydia, trichomonas, bacterial vaginitis or yeast. ?

## 2021-07-23 NOTE — Telephone Encounter (Signed)
Medical Assistant used [employer] to contact patient.  ?Interpreter Name: Burlene Arnt Interpreter #: [id number] ?Patient verified DOB ?Patient is aware of labs being negative for any STI concerns. ?

## 2021-07-26 ENCOUNTER — Other Ambulatory Visit: Payer: Self-pay

## 2021-08-01 ENCOUNTER — Ambulatory Visit: Payer: Self-pay | Admitting: Physician Assistant

## 2021-08-14 ENCOUNTER — Ambulatory Visit: Payer: Self-pay | Admitting: Physician Assistant

## 2021-08-26 ENCOUNTER — Other Ambulatory Visit: Payer: Self-pay

## 2021-08-27 ENCOUNTER — Other Ambulatory Visit: Payer: Self-pay

## 2021-09-05 ENCOUNTER — Ambulatory Visit: Payer: Self-pay | Attending: Physician Assistant | Admitting: Physician Assistant

## 2021-09-05 ENCOUNTER — Other Ambulatory Visit: Payer: Self-pay

## 2021-09-05 ENCOUNTER — Other Ambulatory Visit (HOSPITAL_COMMUNITY)
Admission: RE | Admit: 2021-09-05 | Discharge: 2021-09-05 | Disposition: A | Discharge status: Home / Self Care | Payer: Self-pay | Source: Ambulatory Visit | Attending: Physician Assistant | Admitting: Physician Assistant

## 2021-09-05 ENCOUNTER — Encounter: Payer: Self-pay | Admitting: Physician Assistant

## 2021-09-05 VITALS — BP 124/78 | HR 81 | Wt 365.6 lb

## 2021-09-05 DIAGNOSIS — Z1331 Encounter for screening for depression: Secondary | ICD-10-CM

## 2021-09-05 DIAGNOSIS — N898 Other specified noninflammatory disorders of vagina: Secondary | ICD-10-CM

## 2021-09-05 DIAGNOSIS — E569 Vitamin deficiency, unspecified: Secondary | ICD-10-CM

## 2021-09-05 DIAGNOSIS — J302 Other seasonal allergic rhinitis: Secondary | ICD-10-CM

## 2021-09-05 DIAGNOSIS — R8290 Unspecified abnormal findings in urine: Secondary | ICD-10-CM

## 2021-09-05 DIAGNOSIS — Z3202 Encounter for pregnancy test, result negative: Secondary | ICD-10-CM

## 2021-09-05 DIAGNOSIS — R7303 Prediabetes: Secondary | ICD-10-CM

## 2021-09-05 DIAGNOSIS — Z789 Other specified health status: Secondary | ICD-10-CM

## 2021-09-05 DIAGNOSIS — T7421XA Adult sexual abuse, confirmed, initial encounter: Secondary | ICD-10-CM

## 2021-09-05 LAB — POCT URINALYSIS DIP (CLINITEK)
Bilirubin, UA: NEGATIVE
Blood, UA: NEGATIVE
Glucose, UA: NEGATIVE mg/dL
Ketones, POC UA: NEGATIVE mg/dL
Leukocytes, UA: NEGATIVE
Nitrite, UA: NEGATIVE
POC PROTEIN,UA: NEGATIVE
Spec Grav, UA: >=1.03 — AB (ref 1.010–1.025)
Urobilinogen, UA: 0.2 E.U./dL
pH, UA: 5.5 (ref 5.0–8.0)

## 2021-09-05 LAB — POCT URINE PREGNANCY: Preg Test, Ur: NEGATIVE

## 2021-09-05 LAB — GLUCOSE, POCT (MANUAL RESULT ENTRY): POC Glucose: 94 mg/dl (ref 70–99)

## 2021-09-05 MED ORDER — FLUTICASONE PROPIONATE 50 MCG/ACT NA SUSP
NASAL | 2 refills | Status: DC
  Filled 2021-09-05: qty 16, 30d supply, fill #0
  Filled 2021-11-22: qty 16, 30d supply, fill #1
  Filled 2021-12-19: qty 16, 30d supply, fill #2
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
## 2021-09-05 NOTE — Patient Instructions (Addendum)
[facility] [address], [place], [place] [address] [phone] or [phone] Walk-in urgent care 24/7 for anyone  For [facility] ONLY New patient assessments and therapy walk-ins: Monday and Wednesday 8am-11am First and second Friday 1pm-5pm New patient psychiatry and medication management walk-ins: Mondays, Wednesdays, Thursdays, Fridays 8am-11am No psychiatry walk-ins on Tuesday   *Accepts all insurance and uninsured for Urgent Care needs *Accepts Medicaid and uninsured for outpatient treatment   [facility] (Therapy and psychiatry) [facility] (near [place]) [address] [place], [place] [address] [phone] Fax: [phone] ([place])   [place] [facility] [place],  [place]  [address] [phone] Call for appointment  [facility] [place] (Therapy only)  [facility] [address], [place], [place] [address] Monday - Friday: 8:30 a.m.-12 p.m. / 1 p.m.-2:30 p.m.  [facility] [address] [place], [place] [address] Monday-Friday: 8:30 a.m.-12 p.m. / 2-3:30 p.m. (INSURANCE REQUIRED -MEDICAID ACCEPTED) They do offer a sliding fee scale $20-$30/session   [facility] [place], [place] [address] Phone: [place] [address] [place] [address]  Phone: [phone] (Does not accept Medicaid) (only one provider accepts Medicare) [facility] [address] (at [employer]) [place], [place] [address] (Accepts Medicaid and Medicare)  [facility]  [place]  [place], [place] [address]  Phone: [phone]  [address] [place], [place] [address] Phone: [phone] [facility] Medicaid) [facility] (Therapy only)  [address] [place], [place] [address] Phone: [phone]   [facility] (Therapy only)  [place], [place] [address] Phone: [phone] [facility] & Medicare)   [place] [address] [place], [place] [address] Phone: [phone] ([place]) [employer] [phone] [address] [place], [place] [address] Phone: [phone] [facility]) [facility] (Psychiatry only)  [phone] [address], [place], [place] [address] (Accepts Medicaid and Medicare) [facility] (Psychiatry and therapy)  [place], [place], [place] [address] [phone] [facility] Medicare) [place] (psychiatry and therapy) [address], [place], [place] [address] [phone]  [facility] at [address], [place], [place], [place] [address] [phone] Accept [address] [place], [place], [place], [place],  and the following types of Medicaid; [place], [place], [place], [place], [place], [employer], [place], [place] Complete, and [place], as well as offering a Manufacturing systems engineer and private payment options. Provides In-Office Appointments, Virtual Appointments, and Phone Consultations Offers medication management for ages 46 years old and up, including,  Medication Management for Suboxone and [place] [phone] [address], [place], [place] [address] ([place] Medicaid and Medicare)         19.  [facility] (therapy only)  [address] [place], [place] [address]            [phone] (Accepts medicare) 20. [facility]  (Suboxone and methodone) [phone] [address], [place], [place] [address] To Be Eligible for Opioid Treatment at [facility] you must be at least 38 years of age you have already tried other  interventions that were not successful such as opioid  detox, inpatient rehab for opioids, or outpatient counseling specifically for opioid dependency your [facility] drug test must be completely free of benzodiazepines (klonopin, xanax, valium, ativan, or other benz) you have reliable transportation to the [facility] clinic in [place] you recognize that counseling is a critical component of [facility]' Opioid Program and you agree to attend all required counseling sessions you are committed to total drug abstinence and will conscientiously strive to remain free of alcohol, marijuana, and other illicit substances while in treatment you desire a peaceful treatment atmosphere in which personal responsibility and respect toward staff and clients is the norm   21. [facility] [address], [place], [place] [address] Offers SAIOP (Substance Abuse Intensive Outpatient Program) [phone] 22. [facility] [address] [place], [place] [address] [phone] (Accepts medicare)  For those who are tech savvy, go on psychology today, type in your local city (i.e. [place]. [place]) and specify your insurance at the top of the screen after you search. (Medicaid if needed). You can also specify whether you are interested in therapy and psychiatry.  www.psychologytoday.com/us     [facility] [address], [place], [place] [address] [phone] or [phone] Walk-in urgent care 24/7 for anyone  For [place] Residents ONLY New patient assessments and therapy walk-ins: Monday and Wednesday 8am-11am First and second Friday 1pm-5pm New patient psychiatry and medication management walk-ins: Mondays, Wednesdays, Thursdays, Fridays 8am-11am No psychiatry walk-ins on Tuesday   

## 2021-09-06 ENCOUNTER — Other Ambulatory Visit: Payer: Self-pay | Admitting: Physician Assistant

## 2021-09-06 ENCOUNTER — Other Ambulatory Visit: Payer: Self-pay

## 2021-09-06 LAB — CERVICOVAGINAL ANCILLARY ONLY
Bacterial Vaginitis (gardnerella): POSITIVE — AB
Candida Glabrata: NEGATIVE
Candida Vaginitis: NEGATIVE
Chlamydia: NEGATIVE
Comment: NEGATIVE
Comment: NEGATIVE
Comment: NEGATIVE
Comment: NEGATIVE
Comment: NEGATIVE
Comment: NORMAL
Neisseria Gonorrhea: NEGATIVE
Trichomonas: NEGATIVE

## 2021-09-06 LAB — VITAMIN D 25 HYDROXY (VIT D DEFICIENCY, FRACTURES): Vit D, 25-Hydroxy: 18.2 ng/mL — ABNORMAL LOW (ref 30.0–100.0)

## 2021-09-06 LAB — B12 AND FOLATE PANEL
Folate: 12.2 ng/mL (ref >3.0)
Vitamin B-12: 257 pg/mL (ref 232–1245)

## 2021-09-06 MED ORDER — METRONIDAZOLE 500 MG PO TABS
500.0000 mg | ORAL_TABLET | Freq: Two times a day (BID) | ORAL | 0 refills | Status: DC
  Filled 2021-09-06: qty 14, 7d supply, fill #0

## 2021-09-06 MED ORDER — VITAMIN D (ERGOCALCIFEROL) 1.25 MG (50000 UNIT) PO CAPS
50000.0000 [IU] | ORAL_CAPSULE | ORAL | 0 refills | Status: DC
  Filled 2021-09-06: qty 16, 112d supply, fill #0

## 2021-09-11 ENCOUNTER — Other Ambulatory Visit: Payer: Self-pay

## 2021-09-17 ENCOUNTER — Encounter: Payer: Self-pay | Admitting: *Deleted

## 2021-09-18 ENCOUNTER — Other Ambulatory Visit: Payer: Self-pay

## 2021-10-18 ENCOUNTER — Other Ambulatory Visit: Payer: Self-pay

## 2021-10-18 ENCOUNTER — Other Ambulatory Visit: Payer: Self-pay | Admitting: Family Medicine

## 2021-10-18 DIAGNOSIS — G8929 Other chronic pain: Secondary | ICD-10-CM

## 2021-10-18 MED ORDER — IBUPROFEN 800 MG PO TABS
800.0000 mg | ORAL_TABLET | Freq: Two times a day (BID) | ORAL | 2 refills | Status: DC | PRN
  Filled 2021-10-18: qty 30, 15d supply, fill #0
  Filled 2021-11-22: qty 30, 15d supply, fill #1
  Filled 2021-12-19: qty 30, 15d supply, fill #2

## 2021-10-18 NOTE — Telephone Encounter (Signed)
Requested Prescriptions  Pending Prescriptions Disp Refills  . ibuprofen (ADVIL) 800 MG tablet 30 tablet 2    Sig: Take 1 tablet (800 mg total) by mouth 2 (two) times daily as needed.     Analgesics:  NSAIDS Failed - 10/18/2021  1:31 PM      Failed - Manual Review: Labs are only required if the patient has taken medication for more than 8 weeks.      Passed - Cr in normal range and within 360 days    Creat  Date Value Ref Range Status  08/09/2015 0.58 0.50 - 1.10 mg/dL Final   Creatinine, Ser  Date Value Ref Range Status  03/01/2021 0.62 0.57 - 1.00 mg/dL Final   Creatinine, Urine  Date Value Ref Range Status  10/05/2009 54.3 mg/dL Final         Passed - HGB in normal range and within 360 days    Hemoglobin  Date Value Ref Range Status  03/01/2021 13.8 11.1 - 15.9 g/dL Final         Passed - PLT in normal range and within 360 days    Platelets  Date Value Ref Range Status  03/01/2021 308 150 - 450 x10E3/uL Final         Passed - HCT in normal range and within 360 days    Hematocrit  Date Value Ref Range Status  03/01/2021 41.6 34.0 - 46.6 % Final         Passed - eGFR is 30 or above and within 360 days    GFR, Est African American  Date Value Ref Range Status  06/29/2014 >89 mL/min Final   GFR calc Af Amer  Date Value Ref Range Status  03/21/2020 133 >59 mL/min/1.73 Final    Comment:    **In accordance with recommendations from the NKF-ASN Task force,**   [facility] is in the process of updating its eGFR calculation to the   2021 CKD-EPI creatinine equation that estimates kidney function   without a race variable.    GFR, Est Non African American  Date Value Ref Range Status  06/29/2014 >89 mL/min Final    Comment:      The estimated GFR is a calculation valid for adults (>=40 years old) that uses the CKD-EPI algorithm to adjust for age and sex. It is   not to be used for children, pregnant women, hospitalized patients,    patients on dialysis, or with  rapidly changing kidney function. According to the NKDEP, eGFR >89 is normal, 60-89 shows mild impairment, 30-59 shows moderate impairment, 15-29 shows severe impairment and <15 is ESRD.      GFR, Estimated  Date Value Ref Range Status  08/28/2020 >60 >60 mL/min Final    Comment:    (NOTE) Calculated using the CKD-EPI Creatinine Equation (2021)    eGFR  Date Value Ref Range Status  03/01/2021 118 >59 mL/min/1.73 Final         Passed - Patient is not pregnant      Passed - Valid encounter within last 12 months    Recent Outpatient Visits          1 month ago Prediabetes   [place], [place], [place]   3 months ago Yeast infection of the vagina   [facility], Cari S, PA-C   3 months ago Chronic bilateral low back pain without sciatica   [facility], Cari S,  PA-C   4 months ago Prediabetes   [place], [place]   7 months ago Essential hypertension   [facility], Kriste Basque, NP

## 2021-10-21 ENCOUNTER — Other Ambulatory Visit: Payer: Self-pay

## 2021-11-22 ENCOUNTER — Other Ambulatory Visit: Payer: Self-pay | Admitting: Physician Assistant

## 2021-11-22 ENCOUNTER — Other Ambulatory Visit: Payer: Self-pay

## 2021-11-22 DIAGNOSIS — J302 Other seasonal allergic rhinitis: Secondary | ICD-10-CM

## 2021-11-22 MED ORDER — CETIRIZINE HCL 10 MG PO TABS
10.0000 mg | ORAL_TABLET | Freq: Every day | ORAL | 2 refills | Status: DC
  Filled 2021-11-22: qty 30, 30d supply, fill #0
  Filled 2022-02-18: qty 30, 30d supply, fill #1
  Filled 2022-03-20: qty 30, 30d supply, fill #2

## 2021-12-17 ENCOUNTER — Ambulatory Visit: Payer: Self-pay

## 2021-12-17 NOTE — Telephone Encounter (Signed)
  Chief Complaint: Dark urine pelvic pain, frequency Symptoms: ibid Frequency: 2 weeks Pertinent Negatives: Patient denies fever Disposition: [] ED /[] Urgent Care (no appt availability in office) / [] Appointment(In office/virtual)/ []  [place] [facility]/ [] Home Care/ [x] Refused Recommended Disposition /[] [place] [facility]/ []  Follow-up with PCP Additional Notes: Pt has had s/s for 2 weeks. Pt will wait until appt. Pt was calling to get an earlier appt. Pt does not want to go to UC for care.  Reason for Disposition  Urinating more frequently than usual (i.e., frequency)  Answer Assessment - Initial Assessment Questions 1. SYMPTOM: "What's the main symptom you're concerned about?" (e.g., frequency, incontinence)     Frequency, Dark urine pain 2. ONSET: "When did the  s/s   start?"     2 weeks 3. PAIN: "Is there any pain?" If Yes, ask: "How bad is it?" (Scale: 1-10; mild, moderate, severe)     Yes - unknown 4. CAUSE: "What do you think is causing the symptoms?"     UTI 5. OTHER SYMPTOMS: "Do you have any other symptoms?" (e.g., blood in urine, fever, flank pain, pain with urination)     Abdominal pain 6. PREGNANCY: "Is there any chance you are pregnant?" "When was your last menstrual period?"  Protocols used: Urinary Symptoms-A-AH

## 2021-12-18 NOTE — Telephone Encounter (Signed)
Pt has been placed on the walkin schedule for 12/19/2021

## 2021-12-19 ENCOUNTER — Encounter: Payer: Self-pay | Admitting: Family Medicine

## 2021-12-19 ENCOUNTER — Ambulatory Visit: Payer: Self-pay | Attending: Family Medicine | Admitting: Family Medicine

## 2021-12-19 ENCOUNTER — Other Ambulatory Visit (HOSPITAL_COMMUNITY)
Admission: RE | Admit: 2021-12-19 | Discharge: 2021-12-19 | Disposition: A | Discharge status: Home / Self Care | Payer: Self-pay | Source: Ambulatory Visit | Attending: Family Medicine | Admitting: Family Medicine

## 2021-12-19 ENCOUNTER — Other Ambulatory Visit: Payer: Self-pay

## 2021-12-19 ENCOUNTER — Other Ambulatory Visit: Payer: Self-pay | Admitting: Family Medicine

## 2021-12-19 VITALS — BP 123/80 | HR 80 | Temp 98.1°F | Ht 62.0 in | Wt 360.0 lb

## 2021-12-19 DIAGNOSIS — R7303 Prediabetes: Secondary | ICD-10-CM

## 2021-12-19 DIAGNOSIS — I10 Essential (primary) hypertension: Secondary | ICD-10-CM

## 2021-12-19 DIAGNOSIS — M25561 Pain in right knee: Secondary | ICD-10-CM

## 2021-12-19 DIAGNOSIS — R21 Rash and other nonspecific skin eruption: Secondary | ICD-10-CM

## 2021-12-19 DIAGNOSIS — R399 Unspecified symptoms and signs involving the genitourinary system: Secondary | ICD-10-CM | POA: Insufficient documentation

## 2021-12-19 DIAGNOSIS — G8929 Other chronic pain: Secondary | ICD-10-CM

## 2021-12-19 LAB — POCT URINALYSIS DIP (CLINITEK)
Bilirubin, UA: NEGATIVE
Glucose, UA: NEGATIVE mg/dL
Ketones, POC UA: NEGATIVE mg/dL
Leukocytes, UA: NEGATIVE
Nitrite, UA: NEGATIVE
POC PROTEIN,UA: NEGATIVE
Spec Grav, UA: >=1.03 — AB (ref 1.010–1.025)
Urobilinogen, UA: 0.2 E.U./dL
pH, UA: 6 (ref 5.0–8.0)

## 2021-12-19 MED ORDER — TRULICITY 0.75 MG/0.5ML ~~LOC~~ SOAJ
0.7500 mg | SUBCUTANEOUS | 6 refills | Status: DC
  Filled 2021-12-19: qty 2, 28d supply, fill #0
  Filled 2022-01-20: qty 2, 28d supply, fill #1
  Filled 2022-02-18 (×2): qty 2, 28d supply, fill #2
  Filled 2022-03-20: qty 2, 28d supply, fill #3
  Filled 2022-04-21: qty 2, 28d supply, fill #4
  Filled 2022-05-19: qty 2, 28d supply, fill #5
  Filled 2022-06-15 – 2022-06-16 (×2): qty 2, 28d supply, fill #6

## 2021-12-19 MED ORDER — HYDROCHLOROTHIAZIDE 25 MG PO TABS
25.0000 mg | ORAL_TABLET | Freq: Every day | ORAL | 1 refills | Status: DC
  Filled 2021-12-19: qty 90, 90d supply, fill #0
  Filled 2022-03-20: qty 90, 90d supply, fill #1

## 2021-12-19 MED ORDER — CLOTRIMAZOLE-BETAMETHASONE 1-0.05 % EX CREA
1.0000 | TOPICAL_CREAM | Freq: Every day | CUTANEOUS | 0 refills | Status: DC
  Filled 2021-12-19: qty 45, 30d supply, fill #0

## 2021-12-19 MED ORDER — METFORMIN HCL 500 MG PO TABS
ORAL_TABLET | Freq: Every day | ORAL | 6 refills | Status: DC
  Filled 2021-12-19: qty 30, 30d supply, fill #0
  Filled 2022-01-20: qty 30, 30d supply, fill #1
  Filled 2022-02-18: qty 30, 30d supply, fill #2
  Filled 2022-03-20: qty 30, 30d supply, fill #3
  Filled 2022-05-19: qty 90, 90d supply, fill #4
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
## 2021-12-19 NOTE — Patient Instructions (Signed)
Chronic Knee Pain, Adult Chronic knee pain is pain in one or both knees that lasts longer than 3 months. Symptoms of chronic knee pain may include swelling, stiffness, and discomfort. Age-related wear and tear (osteoarthritis) of the knee joint is the most common cause of chronic knee pain. Other possible causes include: A long-term immune-related disease that causes inflammation of the knee (rheumatoid arthritis). This usually affects both knees. Inflammatory arthritis, such as gout or pseudogout. An injury to the knee that causes arthritis. An injury to the knee that damages the ligaments. Ligaments are strong tissues that connect bones to each other. Runner's knee or pain behind the kneecap. Treatment for chronic knee pain depends on the cause. The main treatments for chronic knee pain are physical therapy and weight loss. This condition may also be treated with medicines, injections, a knee sleeve or brace, and by using crutches. Rest, ice, pressure (compression), and elevation, also known as RICE therapy, may also be recommended. Follow these instructions at home: If you have a knee sleeve or brace:  Wear the knee sleeve or brace as told by your health care provider. Remove it only as told by your health care provider. Loosen it if your toes tingle, become numb, or turn cold and blue. Keep it clean. If the sleeve or brace is not waterproof: Do not let it get wet. Remove it if allowed by your health care provider, or cover it with a watertight covering when you take a bath or a shower. Managing pain, stiffness, and swelling     If directed, apply heat to the affected area as often as told by your health care provider. Use the heat source that your health care provider recommends, such as a moist heat pack or a heating pad. If you have a removable knee sleeve or brace, remove it as told by your health care provider. Place a towel between your skin and the heat source. Leave the heat on for  20-30 minutes. Remove the heat if your skin turns bright red. This is especially important if you are unable to feel pain, heat, or cold. You may have a greater risk of getting burned. If directed, put ice on the affected area. To do this: If you have a removable knee sleeve or brace, remove it as told by your health care provider. Put ice in a plastic bag. Place a towel between your skin and the bag. Leave the ice on for 20 minutes, 2-3 times a day. Remove the ice if your skin turns bright red. This is very important. If you cannot feel pain, heat, or cold, you have a greater risk of damage to the area. Move your toes often to reduce stiffness and swelling. Raise (elevate) the injured area above the level of your heart while you are sitting or lying down. Activity Avoid high-impact activities or exercises, such as running, jumping rope, or doing jumping jacks. Follow the exercise plan that your health care provider designed for you. Your health care provider may suggest that you: Avoid activities that make knee pain worse. This may require you to change your exercise routines, sport participation, or job duties. Wear shoes with cushioned soles. Avoid sports that require running and sudden changes in direction. Do physical therapy. Physical therapy is planned to match your needs and abilities. It may include exercises for strength, flexibility, stability, and endurance. Do exercises that increase balance and strength, such as tai chi and yoga. Do not use the injured limb to support your   body weight until your health care provider says that you can. Use crutches as told by your health care provider. Return to your normal activities as told by your health care provider. Ask your health care provider what activities are safe for you. General instructions Take over-the-counter and prescription medicines only as told by your health care provider. Lose weight if you are overweight. Losing even a  little weight can reduce knee pain. Ask your health care provider what your ideal weight is, and how to safely lose extra weight. A dietitian may be able to help you plan your meals. Do not use any products that contain nicotine or tobacco, such as cigarettes, e-cigarettes, and chewing tobacco. These can delay healing. If you need help quitting, ask your health care provider. Keep all follow-up visits. This is important. Contact a health care provider if: You have knee pain that is not getting better or gets worse. You are unable to do your physical therapy exercises due to knee pain. Get help right away if: Your knee swells and the swelling becomes worse. You cannot move your knee. You have severe knee pain. Summary Knee pain that lasts more than 3 months is considered chronic knee pain. The main treatments for chronic knee pain are physical therapy and weight loss. You may also need to take medicines, wear a knee sleeve or brace, use crutches, and apply ice or heat. Losing even a little weight can reduce knee pain. Ask your health care provider what your ideal weight is, and how to safely lose extra weight. A dietitian may be able to help you plan your meals. Follow the exercise plan that your health care provider designed for you. This information is not intended to replace advice given to you by your health care provider. Make sure you discuss any questions you have with your health care provider. Document Revised: 09/14/2019 Document Reviewed: 09/14/2019 Elsevier Patient Education  [place].

## 2021-12-20 ENCOUNTER — Other Ambulatory Visit: Payer: Self-pay

## 2021-12-20 LAB — BASIC METABOLIC PANEL
BUN/Creatinine Ratio: 22 (ref 9–23)
BUN: 14 mg/dL (ref 6–20)
CO2: 26 mmol/L (ref 20–29)
Calcium: 9.2 mg/dL (ref 8.7–10.2)
Chloride: 98 mmol/L (ref 96–106)
Creatinine, Ser: 0.64 mg/dL (ref 0.57–1.00)
Glucose: 92 mg/dL (ref 70–99)
Potassium: 3.4 mmol/L — ABNORMAL LOW (ref 3.5–5.2)
Sodium: 138 mmol/L (ref 134–144)
eGFR: 116 mL/min/{1.73_m2} (ref >59)

## 2021-12-20 LAB — HEMOGLOBIN A1C
Est. average glucose Bld gHb Est-mCnc: 123 mg/dL
Hgb A1c MFr Bld: 5.9 % — ABNORMAL HIGH (ref 4.8–5.6)

## 2021-12-23 LAB — CERVICOVAGINAL ANCILLARY ONLY
Bacterial Vaginitis (gardnerella): NEGATIVE
Comment: NEGATIVE
Comment: NEGATIVE
Comment: NEGATIVE
Comment: NEGATIVE
Comment: NEGATIVE
Comment: NORMAL

## 2021-12-24 ENCOUNTER — Ambulatory Visit: Payer: Self-pay | Admitting: Nurse Practitioner

## 2022-01-20 ENCOUNTER — Other Ambulatory Visit: Payer: Self-pay | Admitting: Family Medicine

## 2022-01-20 ENCOUNTER — Other Ambulatory Visit: Payer: Self-pay | Admitting: Physician Assistant

## 2022-01-20 ENCOUNTER — Other Ambulatory Visit (HOSPITAL_COMMUNITY): Payer: Self-pay

## 2022-01-20 ENCOUNTER — Other Ambulatory Visit: Payer: Self-pay

## 2022-01-20 DIAGNOSIS — J302 Other seasonal allergic rhinitis: Secondary | ICD-10-CM

## 2022-01-20 DIAGNOSIS — G8929 Other chronic pain: Secondary | ICD-10-CM

## 2022-01-20 MED ORDER — FLUTICASONE PROPIONATE 50 MCG/ACT NA SUSP
2.0000 | Freq: Every day | NASAL | 2 refills | Status: DC
  Filled 2022-01-20 – 2022-02-18 (×2): qty 16, 30d supply, fill #0
  Filled 2022-03-20: qty 16, 30d supply, fill #1
  Filled 2022-04-21: qty 16, 30d supply, fill #2

## 2022-01-20 MED ORDER — IBUPROFEN 800 MG PO TABS
800.0000 mg | ORAL_TABLET | Freq: Two times a day (BID) | ORAL | 2 refills | Status: DC | PRN
  Filled 2022-01-20 – 2022-02-18 (×2): qty 30, 15d supply, fill #0
  Filled 2022-04-09: qty 30, 15d supply, fill #1
  Filled 2022-04-21: qty 30, 15d supply, fill #2

## 2022-01-21 ENCOUNTER — Other Ambulatory Visit: Payer: Self-pay

## 2022-01-28 ENCOUNTER — Other Ambulatory Visit: Payer: Self-pay

## 2022-02-18 ENCOUNTER — Other Ambulatory Visit: Payer: Self-pay

## 2022-02-18 ENCOUNTER — Ambulatory Visit: Payer: Self-pay | Admitting: *Deleted

## 2022-02-18 NOTE — Telephone Encounter (Signed)
  Chief Complaint: stops breathing at night while asleep. Requesting referral  Symptoms: stops breathing at night while asleep and family reports they are concerned for her. C/o congestion phlegm at times but feels it is managed with medication flonase, honey lime and mexican cinnamon.  Frequency: on going and getting worse since hernia surgery. Pertinent Negatives: Patient denies chest pain no difficulty breathing during awake hours.  Disposition: [] ED /[] Urgent Care (no appt availability in office) / [x] Appointment(In office/virtual)/ []  [facility]/ [] Home Care/ [] Refused Recommended Disposition /[] [place] [facility] [facility]/ []  Follow-up with PCP Additional Notes:   Appt scheduled 03/26/22 but would like earlier appt. Requesting referral . Please advise.     Reason for Disposition  [1] MILD longstanding difficulty breathing AND [2]  SAME as normal  Answer Assessment - Initial Assessment Questions 1. RESPIRATORY STATUS: "Describe your breathing?" (e.g., wheezing, shortness of breath, unable to speak, severe coughing)      None during day . Stops sleeping at night  2. ONSET: "When did this breathing problem begin?"      Stops breathing while asleep and wakes self up  3. PATTERN "Does the difficult breathing come and go, or has it been constant since it started?"      Wakes self up at night difficulty breathing  4. SEVERITY: "How bad is your breathing?" (e.g., mild, moderate, severe)    - MILD: No SOB at rest, mild SOB with walking, speaks normally in sentences, can lie down, no retractions, pulse < 100.    - MODERATE: SOB at rest, SOB with minimal exertion and prefers to sit, cannot lie down flat, speaks in phrases, mild retractions, audible wheezing, pulse 100-120.    - SEVERE: Very SOB at rest, speaks in single words, struggling to breathe, sitting hunched forward, retractions, pulse > 120      na 5. RECURRENT SYMPTOM: "Have you had difficulty breathing before?" If Yes, ask:  "When was the last time?" and "What happened that time?"      na 6. CARDIAC HISTORY: "Do you have any history of heart disease?" (e.g., heart attack, angina, bypass surgery, angioplasty)      na 7. LUNG HISTORY: "Do you have any history of lung disease?"  (e.g., pulmonary embolus, asthma, emphysema)     na 8. CAUSE: "What do you think is causing the breathing problem?"      Sleep apnea  9. OTHER SYMPTOMS: "Do you have any other symptoms? (e.g., dizziness, runny nose, cough, chest pain, fever)     Congestion phlegm. Now taking allergy medication but reports stops breathing at night  10. O2 SATURATION MONITOR:  "Do you use an oxygen saturation monitor (pulse oximeter) at home?" If Yes, ask: "What is your reading (oxygen level) today?" "What is your usual oxygen saturation reading?" (e.g., 95%)       na 11. PREGNANCY: "Is there any chance you are pregnant?" "When was your last menstrual period?"       na 12. TRAVEL: "Have you traveled out of the country in the last month?" (e.g., travel history, exposures)       na  Protocols used: Breathing Difficulty-A-AH

## 2022-02-19 NOTE — Telephone Encounter (Addendum)
Scheduled an apt for a telephone visit Tuesday, November 14 with PCP for continuous clearing of throat, sore throat, nasal congestion. Advised she needs to complete packet for OC since she is inquiring about a referral. Appt was scheduled for Tuesday morning in person with financial counselor.

## 2022-02-21 ENCOUNTER — Other Ambulatory Visit: Payer: Self-pay

## 2022-02-25 ENCOUNTER — Ambulatory Visit: Payer: Self-pay | Attending: Family Medicine

## 2022-02-25 ENCOUNTER — Ambulatory Visit (HOSPITAL_BASED_OUTPATIENT_CLINIC_OR_DEPARTMENT_OTHER): Payer: Self-pay | Admitting: Family Medicine

## 2022-02-25 ENCOUNTER — Other Ambulatory Visit: Payer: Self-pay

## 2022-02-25 DIAGNOSIS — J328 Other chronic sinusitis: Secondary | ICD-10-CM

## 2022-02-25 MED ORDER — PREDNISONE 20 MG PO TABS
20.0000 mg | ORAL_TABLET | Freq: Every day | ORAL | 0 refills | Status: DC
  Filled 2022-02-25: qty 5, 5d supply, fill #0

## 2022-02-25 MED ORDER — AMOXICILLIN-POT CLAVULANATE 875-125 MG PO TABS
1.0000 | ORAL_TABLET | Freq: Two times a day (BID) | ORAL | 0 refills | Status: DC
  Filled 2022-02-25: qty 20, 10d supply, fill #0
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
## 2022-03-12 ENCOUNTER — Ambulatory Visit: Payer: Self-pay

## 2022-03-12 DIAGNOSIS — J328 Other chronic sinusitis: Secondary | ICD-10-CM

## 2022-03-12 NOTE — Addendum Note (Signed)
Addended by: [name] on: 03/12/2022 05:33 PM   Modules accepted: Orders

## 2022-03-12 NOTE — Telephone Encounter (Signed)
I have placed referral to ENT for her.  Advised to continue Flonase, Zyrtec in the meantime.

## 2022-03-12 NOTE — Telephone Encounter (Signed)
  Chief Complaint: nasal congestion, facial pain,  Symptoms: difficulty breathing at night, nasal congestion, throat secretions, wakes up gasping- no issue during the day , fatigue, frequent clearing throat Frequency: 02/12/22 Pertinent Negatives: Patient denies fever, SOB during the day, chest pain Disposition: [] ED /[] Urgent Care (no appt availability in office) / [] Appointment(In office/virtual)/ []  [place] [facility]/ [] Home Care/ [] Refused Recommended Disposition /[] [place] [facility]/ [x]  Follow-up with PCP Additional Notes: Per OV 02/25/22 pt was to call back or seek in person evaluation. Pt refused to make appt.  Reason for Disposition  [1] Sinus congestion (pressure, fullness) AND [2] present > 10 days  Answer Assessment - Initial Assessment Questions 1. LOCATION: "Where does it hurt?"      Forehead, nose 2. ONSET: "When did the sinus pain start?"  (e.g., hours, days)      All the time 3. SEVERITY: "How bad is the pain?"   (Scale 1-10; mild, moderate or severe)   - MILD (1-3): doesn't interfere with normal activities    - MODERATE (4-7): interferes with normal activities (e.g., work or school) or awakens from sleep   - SEVERE (8-10): excruciating pain and patient unable to do any normal activities        Moderate wakes up gasping 4. RECURRENT SYMPTOM: "Have you ever had sinus problems before?" If Yes, ask: "When was the last time?" and "What happened that time?"      Yes- nasal spray abx 5. NASAL CONGESTION: "Is the nose blocked?" If Yes, ask: "Can you open it or must you breathe through your mouth?"     no 6. NASAL DISCHARGE: "Do you have discharge from your nose?" If so ask, "What color?"     yellow 7. FEVER: "Do you have a fever?" If Yes, ask: "What is it, how was it measured, and when did it start?"      no 8. OTHER SYMPTOMS: "Do you have any other symptoms?" (e.g., sore throat, cough, earache, difficulty breathing)     Drainage in to throat headache, earache 9.  PREGNANCY: "Is there any chance you are pregnant?" "When was your last menstrual period?"     N/a  Protocols used: Sinus Pain or Congestion-A-AH

## 2022-03-14 NOTE — Telephone Encounter (Signed)
Unable to reach, voicemail not set up.  

## 2022-03-18 ENCOUNTER — Ambulatory Visit: Payer: Self-pay | Admitting: *Deleted

## 2022-03-18 NOTE — Telephone Encounter (Signed)
  Chief Complaint: difficulty breathing requesting medication refills  Symptoms: difficulty breathing unable to cough up phlegm. Zyrtec not working. Frequency: a few months but worsening now  Pertinent Negatives: Patient denies chest pain  Disposition: [] ED /[x] Urgent Care (no appt availability in office) / [] Appointment(In office/virtual)/ []  [place] [facility]/ [] Home Care/ [] Refused Recommended Disposition /[x] [place] [facility]/ []  Follow-up with PCP Additional Notes:   Requesting medication refill for antibiotic and "another medication to help with breathing" . Reports she has tried to get appt and PCP gave referral but specialist tells her no referral has been made for [facility] in [place]. Please advise. Recommended to go to UC or ED or mobile bus . Patient is very assertive she only wants refills of medication and does not want to go to ED/ UC or mobile bus. Please advise .      Reason for Disposition  [1] Longstanding difficulty breathing AND [2] not responding to usual therapy  Answer Assessment - Initial Assessment Questions 1. RESPIRATORY STATUS: "Describe your breathing?" (e.g., wheezing, shortness of breath, unable to speak, severe coughing)  Difficulty  breathing dx sinusitis    2. ONSET: "When did this breathing problem begin?"      A few months  3. PATTERN "Does the difficult breathing come and go, or has it been constant since it started?"      All day every day  4. SEVERITY: "How bad is your breathing?" (e.g., mild, moderate, severe)    - MILD: No SOB at rest, mild SOB with walking, speaks normally in sentences, can lie down, no retractions, pulse < 100.    - MODERATE: SOB at rest, SOB with minimal exertion and prefers to sit, cannot lie down flat, speaks in phrases, mild retractions, audible wheezing, pulse 100-120.    - SEVERE: Very SOB at rest, speaks in single words, struggling to breathe, sitting hunched forward, retractions, pulse > 120       Moderate to severe 5. RECURRENT SYMPTOM: "Have you had difficulty breathing before?" If Yes, ask: "When was the last time?" and "What happened that time?"      Yes  6. CARDIAC HISTORY: "Do you have any history of heart disease?" (e.g., heart attack, angina, bypass surgery, angioplasty)      na 7. LUNG HISTORY: "Do you have any history of lung disease?"  (e.g., pulmonary embolus, asthma, emphysema)     Hx  8. CAUSE: "What do you think is causing the breathing problem?"      Hx sinusitis  9. OTHER SYMPTOMS: "Do you have any other symptoms? (e.g., dizziness, runny nose, cough, chest pain, fever)     Headaches, can not cough up phlegm. Frontal headache 10. O2 SATURATION MONITOR:  "Do you use an oxygen saturation monitor (pulse oximeter) at home?" If Yes, ask: "What is your reading (oxygen level) today?" "What is your usual oxygen saturation reading?" (e.g., 95%)       na 11. PREGNANCY: "Is there any chance you are pregnant?" "When was your last menstrual period?"       na 12. TRAVEL: "Have you traveled out of the country in the last month?" (e.g., travel history, exposures)       na  Protocols used: Breathing Difficulty-A-AH

## 2022-03-20 ENCOUNTER — Other Ambulatory Visit: Payer: Self-pay | Admitting: Family Medicine

## 2022-03-20 ENCOUNTER — Other Ambulatory Visit: Payer: Self-pay

## 2022-03-20 DIAGNOSIS — J328 Other chronic sinusitis: Secondary | ICD-10-CM

## 2022-03-20 NOTE — Telephone Encounter (Signed)
Requested medication (s) are due for refill today: -  Requested medication (s) are on the active medication list: yes  Last refill:  both meds last ordered 02/25/22   Future visit scheduled: yes  Notes to clinic:  meds not delegated to NT to RF   Requested Prescriptions  Pending Prescriptions Disp Refills   predniSONE (DELTASONE) 20 MG tablet 5 tablet 0    Sig: Take 1 tablet (20 mg total) by mouth daily with breakfast.     Not Delegated - Endocrinology:  Oral Corticosteroids Failed - 03/20/2022  2:58 PM      Failed - This refill cannot be delegated      Failed - Manual Review: Eye exam for IOP if prolonged treatment      Failed - K in normal range and within 180 days    Potassium  Date Value Ref Range Status  12/19/2021 3.4 (L) 3.5 - 5.2 mmol/L Final         Failed - Bone Mineral Density or Dexa Scan completed in the last 2 years      Passed - Glucose (serum) in normal range and within 180 days    Glucose  Date Value Ref Range Status  12/19/2021 92 70 - 99 mg/dL Final   Glucose, Bld  Date Value Ref Range Status  08/28/2020 98 70 - 99 mg/dL Final    Comment:    Glucose reference range applies only to samples taken after fasting for at least 8 hours.   POC Glucose  Date Value Ref Range Status  09/05/2021 94 70 - 99 mg/dl Final   Glucose-Capillary  Date Value Ref Range Status  04/24/2020 125 (H) 70 - 99 mg/dL Final    Comment:    Glucose reference range applies only to samples taken after fasting for at least 8 hours.         Passed - Na in normal range and within 180 days    Sodium  Date Value Ref Range Status  12/19/2021 138 134 - 144 mmol/L Final         Passed - Last BP in normal range    BP Readings from Last 1 Encounters:  12/19/21 123/80         Passed - Valid encounter within last 6 months    Recent Outpatient Visits           3 weeks ago Other chronic sinusitis   [facility] [place], Odette Horns, MD   3 months ago UTI  symptoms   [facility] [place], Odette Horns, MD   6 months ago Prediabetes   [facility] [place], [place], [place]   8 months ago Yeast infection of the vagina   [facility], Cari S, PA-C   8 months ago Chronic bilateral low back pain without sciatica   [facility], Kasandra Knudsen, PA-C       Future Appointments             In 6 days Anders Simmonds, PA-C [facility]   In 1 month [place], Arnold Line, MD [facility]             amoxicillin-clavulanate (AUGMENTIN) 875-125 MG tablet 20 tablet 0    Sig: Take 1 tablet by mouth 2 (two) times daily.     Off-Protocol Failed - 03/20/2022  2:58 PM  Failed - Medication not assigned to a protocol, review manually.      Passed - Valid encounter within last 12 months    Recent Outpatient Visits           3 weeks ago Other chronic sinusitis   [facility] [place], Odette Horns, MD   3 months ago UTI symptoms   [facility] [name], MD   6 months ago Prediabetes   [facility] [place], [place], [place]   8 months ago Yeast infection of the vagina   [facility], Cari S, PA-C   8 months ago Chronic bilateral low back pain without sciatica   [facility], Kasandra Knudsen, PA-C       Future Appointments             In 6 days Sharon Seller, [place] Organ [place] [facility]   In 1 month [name], MD [facility]

## 2022-03-21 ENCOUNTER — Other Ambulatory Visit: Payer: Self-pay

## 2022-03-26 ENCOUNTER — Telehealth: Payer: Self-pay | Admitting: Physician Assistant

## 2022-03-26 ENCOUNTER — Other Ambulatory Visit: Payer: Self-pay

## 2022-03-26 ENCOUNTER — Encounter: Payer: Self-pay | Admitting: Physician Assistant

## 2022-03-26 ENCOUNTER — Ambulatory Visit: Payer: Self-pay | Attending: Physician Assistant | Admitting: Physician Assistant

## 2022-03-26 VITALS — BP 126/86 | HR 72 | Temp 98.0°F | Ht 62.0 in | Wt 376.0 lb

## 2022-03-26 DIAGNOSIS — R052 Subacute cough: Secondary | ICD-10-CM

## 2022-03-26 DIAGNOSIS — Z1331 Encounter for screening for depression: Secondary | ICD-10-CM

## 2022-03-26 DIAGNOSIS — G4739 Other sleep apnea: Secondary | ICD-10-CM

## 2022-03-26 DIAGNOSIS — R0981 Nasal congestion: Secondary | ICD-10-CM

## 2022-03-26 DIAGNOSIS — K029 Dental caries, unspecified: Secondary | ICD-10-CM

## 2022-03-26 DIAGNOSIS — R7303 Prediabetes: Secondary | ICD-10-CM

## 2022-03-26 DIAGNOSIS — K5909 Other constipation: Secondary | ICD-10-CM

## 2022-03-26 DIAGNOSIS — Z6841 Body Mass Index (BMI) 40.0 and over, adult: Secondary | ICD-10-CM

## 2022-03-26 MED ORDER — OMEPRAZOLE 20 MG PO CPDR
20.0000 mg | DELAYED_RELEASE_CAPSULE | Freq: Every day | ORAL | 0 refills | Status: DC
  Filled 2022-03-26 – 2022-04-09 (×2): qty 90, 90d supply, fill #0

## 2022-03-26 MED ORDER — POLYETHYLENE GLYCOL 3350 17 GM/SCOOP PO POWD
17.0000 g | Freq: Two times a day (BID) | ORAL | 1 refills | Status: DC | PRN
  Filled 2022-03-26 – 2022-04-09 (×2): qty 1020, 30d supply, fill #0
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
## 2022-03-26 NOTE — Patient Instructions (Signed)
Apnea del sueo Sleep Apnea La apnea del sueo afecta la respiracin mientras se duerme. Hace que la respiracin se detenga durante 10 segundos o ms tiempo, o se vuelva superficial. Las personas con apnea del sueo suelen roncar fuerte. Tambin puede aumentar el riesgo de: Infarto de miocardio. Accidente cerebrovascular. Tener mucho sobrepeso (obesidad). Diabetes. Insuficiencia cardaca. Latidos cardacos irregulares. Presin arterial alta. El objetivo del tratamiento es ayudarle a respirar normalmente otra vez. Cules son las causas?  La causa ms frecuente de esta afeccin es la obstruccin o el colapso de las vas respiratorias. Existen tres tipos de apnea del sueo: Apnea obstructiva del sueo. Esta ocurre cuando las vas respiratorias se obstruyen o colapsan. Apnea central del sueo. Esta ocurre cuando el cerebro no enva las seales correctas a los msculos que controlan la respiracin. Apnea mixta del sueo. Esta es una combinacin de apnea obstructiva y central del sueo. Qu incrementa el riesgo? Tener sobrepeso. Fumar. Tener vas respiratorias pequeas. El envejecimiento. Ser hombre. El consumo de alcohol. Tomar medicamentos para calmarse (sedantes o tranquilizantes). Tener familiares con esta afeccin. Tener la lengua o las amgdalas ms grandes de lo normal. Cules son los signos o sntomas? Dificultad para permanecer dormido. Ronquidos fuertes. Dolor de cabeza por la maana. Despertarse con falta de aliento. Sequedad de boca o dolor de garganta por la maana. Estar somnoliento o cansado durante el da. Si tiene sueo o est cansado durante el da, tambin es posible que: No pueda enfocar la mente (concentrarse). Olvide las cosas. Se enfade mucho y tenga cambios de humor. Se sienta triste (deprimido). Haya cambios en su personalidad. Tenga menos inters en el sexo si es mujer. Sea incapaz de tener una ereccin si es hombre. Cmo se trata?  Dormir de  costado. Usar un medicamento para eliminar la mucosidad de la nariz (descongestivo). Evitar el consumo de alcohol, medicamentos que ayudan a relajarse o ciertos analgsicos (narcticos). Bajar de peso, si es necesario. Cambios en la dieta. Dejar de fumar. Usar una mquina para abrir las vas respiratorias mientras duerme; por ejemplo: Un aparato bucal. Se trata de una boquilla que desplaza la mandbula hacia adelante. Un dispositivo CPAP. Este dispositivo sopla aire a travs de una mscara cuando usted exhala. Un dispositivo EPAP. Este tiene vlvulas que se colocan en cada fosa nasal. Un dispositivo BIPAP. Este dispositivo sopla aire a travs de una mscara cuando usted inhala y exhala. Someterse a ciruga si los dems tratamientos no resultan eficaces. Siga estas indicaciones en su casa: Estilo de vida Haga los cambios que le haya recomendado el mdico. Siga una dieta saludable. Baje de peso, si es necesario. Evite el alcohol, los medicamentos para relajarse y algunos analgsicos. No fume ni consuma ningn producto que contenga nicotina o tabaco. Si necesita ayuda para dejar de consumir estos productos, consulte al mdico. Indicaciones generales Use los medicamentos de venta libre y los recetados solamente como se lo haya indicado el mdico. Si le proporcionaron una mquina para usar mientras duerme, sela solamente como se lo haya indicado el mdico. Si va a someterse a una ciruga, no olvide informarle al mdico que tiene apnea del sueo. Puede ser necesario que lleve su dispositivo consigo. Concurra a todas las visitas de seguimiento. Comunquese con un mdico si: El dispositivo que le proporcionaron para usar mientras duerme le molesta o parece no funcionar. No se siente mejor. Empeora. Solicite ayuda de inmediato si: Le duele el pecho. Tiene dificultad para inhalar suficiente aire. Tiene molestias en la espalda, en los brazos o en   el estmago. Tiene dificultad para  hablar. Siente debilidad en un lado del cuerpo. Se le cae un lado de la cara. Estos sntomas pueden indicar una emergencia. Solicite ayuda de inmediato. Comunquese con el servicio de emergencias de su localidad (911 en los [place]). No espere a ver si los sntomas desaparecen. No conduzca por sus propios medios hasta el hospital. Resumen Esta afeccin afecta la respiracin durante el sueo. La causa ms frecuente es la obstruccin o el colapso de las vas respiratorias. El objetivo del tratamiento es ayudarlo a respirar normalmente mientras duerme. Esta informacin no tiene como fin reemplazar el consejo del mdico. Asegrese de hacerle al mdico cualquier pregunta que tenga. Document Revised: 12/12/2020 Document Reviewed: 12/12/2020 Elsevier Patient Education  2023 Elsevier Inc.  

## 2022-03-27 LAB — TSH: TSH: 3.03 u[IU]/mL (ref 0.450–4.500)

## 2022-03-31 ENCOUNTER — Other Ambulatory Visit: Payer: Self-pay

## 2022-04-01 ENCOUNTER — Other Ambulatory Visit: Payer: Self-pay

## 2022-04-09 ENCOUNTER — Other Ambulatory Visit: Payer: Self-pay

## 2022-04-15 ENCOUNTER — Encounter: Payer: Self-pay | Admitting: *Deleted

## 2022-04-21 ENCOUNTER — Encounter: Payer: Self-pay | Admitting: Family Medicine

## 2022-04-21 ENCOUNTER — Ambulatory Visit: Payer: Self-pay | Attending: Family Medicine | Admitting: Family Medicine

## 2022-04-21 ENCOUNTER — Other Ambulatory Visit: Payer: Self-pay

## 2022-04-21 VITALS — BP 169/82 | HR 87 | Ht 62.0 in | Wt 365.2 lb

## 2022-04-21 DIAGNOSIS — I10 Essential (primary) hypertension: Secondary | ICD-10-CM

## 2022-04-21 DIAGNOSIS — F32A Depression, unspecified: Secondary | ICD-10-CM

## 2022-04-21 DIAGNOSIS — G4739 Other sleep apnea: Secondary | ICD-10-CM

## 2022-04-21 DIAGNOSIS — M25561 Pain in right knee: Secondary | ICD-10-CM

## 2022-04-21 DIAGNOSIS — F329 Major depressive disorder, single episode, unspecified: Secondary | ICD-10-CM | POA: Insufficient documentation

## 2022-04-21 DIAGNOSIS — Z6841 Body Mass Index (BMI) 40.0 and over, adult: Secondary | ICD-10-CM

## 2022-04-21 DIAGNOSIS — R7303 Prediabetes: Secondary | ICD-10-CM

## 2022-04-21 DIAGNOSIS — M545 Low back pain, unspecified: Secondary | ICD-10-CM

## 2022-04-21 DIAGNOSIS — J302 Other seasonal allergic rhinitis: Secondary | ICD-10-CM

## 2022-04-21 DIAGNOSIS — F419 Anxiety disorder, unspecified: Secondary | ICD-10-CM

## 2022-04-21 DIAGNOSIS — G8929 Other chronic pain: Secondary | ICD-10-CM

## 2022-04-21 DIAGNOSIS — L918 Other hypertrophic disorders of the skin: Secondary | ICD-10-CM

## 2022-04-21 MED ORDER — IBUPROFEN 800 MG PO TABS
800.0000 mg | ORAL_TABLET | Freq: Two times a day (BID) | ORAL | 2 refills | Status: DC | PRN
  Filled 2022-05-19: qty 90, 45d supply, fill #0

## 2022-04-21 MED ORDER — FLUTICASONE PROPIONATE 50 MCG/ACT NA SUSP
2.0000 | Freq: Every day | NASAL | 2 refills | Status: DC
  Filled 2022-05-19: qty 16, 30d supply, fill #0
  Filled 2022-06-15 – 2022-06-16 (×2): qty 16, 30d supply, fill #1
  Filled 2022-07-07 – 2022-07-11 (×2): qty 16, 30d supply, fill #2

## 2022-04-21 MED ORDER — BUPROPION HCL ER (XL) 150 MG PO TB24
150.0000 mg | ORAL_TABLET | Freq: Every day | ORAL | 1 refills | Status: DC
  Filled 2022-04-21: qty 90, 90d supply, fill #0

## 2022-04-21 MED ORDER — HYDROCHLOROTHIAZIDE 25 MG PO TABS
25.0000 mg | ORAL_TABLET | Freq: Every day | ORAL | 1 refills | Status: DC
  Filled 2022-04-21 – 2022-07-11 (×5): qty 90, 90d supply, fill #0

## 2022-04-21 MED ORDER — CETIRIZINE HCL 10 MG PO TABS
10.0000 mg | ORAL_TABLET | Freq: Every day | ORAL | 2 refills | Status: DC
  Filled 2022-04-21: qty 30, 30d supply, fill #0
  Filled 2022-06-15 – 2022-06-16 (×2): qty 30, 30d supply, fill #1
  Filled 2022-07-07 – 2022-07-11 (×2): qty 30, 30d supply, fill #2
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
## 2022-04-21 NOTE — Patient Instructions (Addendum)
[facility] ph. # [phone] [address] [place], [place] [address]  [facility] ph. # [phone] Fax# [phone] address [address]

## 2022-04-21 NOTE — Progress Notes (Signed)
Had fall at work having pain in knees and back.

## 2022-04-22 ENCOUNTER — Other Ambulatory Visit: Payer: Self-pay

## 2022-04-22 LAB — CBC WITH DIFFERENTIAL/PLATELET
Basophils Absolute: 0 10*3/uL (ref 0.0–0.2)
Basos: 0 %
EOS (ABSOLUTE): 0.2 10*3/uL (ref 0.0–0.4)
Eos: 2 %
Hematocrit: 42.9 % (ref 34.0–46.6)
Hemoglobin: 13.9 g/dL (ref 11.1–15.9)
Immature Grans (Abs): 0 10*3/uL (ref 0.0–0.1)
Immature Granulocytes: 0 %
Lymphocytes Absolute: 2.1 10*3/uL (ref 0.7–3.1)
Lymphs: 23 %
MCH: 29.2 pg (ref 26.6–33.0)
MCHC: 32.4 g/dL (ref 31.5–35.7)
MCV: 90 fL (ref 79–97)
Monocytes Absolute: 0.6 10*3/uL (ref 0.1–0.9)
Monocytes: 6 %
Neutrophils Absolute: 6.4 10*3/uL (ref 1.4–7.0)
Neutrophils: 69 %
Platelets: 316 10*3/uL (ref 150–450)
RBC: 4.76 x10E6/uL (ref 3.77–5.28)
RDW: 12.6 % (ref 11.7–15.4)
WBC: 9.4 10*3/uL (ref 3.4–10.8)

## 2022-04-22 LAB — CMP14+EGFR
ALT: 21 IU/L (ref 0–32)
AST: 18 IU/L (ref 0–40)
Albumin/Globulin Ratio: 1.9 (ref 1.2–2.2)
Albumin: 4.4 g/dL (ref 3.9–4.9)
Alkaline Phosphatase: 69 IU/L (ref 44–121)
BUN/Creatinine Ratio: 12 (ref 9–23)
BUN: 9 mg/dL (ref 6–20)
Bilirubin Total: 0.3 mg/dL (ref 0.0–1.2)
CO2: 24 mmol/L (ref 20–29)
Calcium: 9.6 mg/dL (ref 8.7–10.2)
Chloride: 99 mmol/L (ref 96–106)
Creatinine, Ser: 0.73 mg/dL (ref 0.57–1.00)
Globulin, Total: 2.3 g/dL (ref 1.5–4.5)
Glucose: 104 mg/dL — ABNORMAL HIGH (ref 70–99)
Potassium: 4.1 mmol/L (ref 3.5–5.2)
Sodium: 137 mmol/L (ref 134–144)
Total Protein: 6.7 g/dL (ref 6.0–8.5)
eGFR: 108 mL/min/{1.73_m2} (ref >59)

## 2022-04-22 LAB — LP+NON-HDL CHOLESTEROL
Cholesterol, Total: 191 mg/dL (ref 100–199)
HDL: 42 mg/dL (ref >39)
LDL Chol Calc (NIH): 118 mg/dL — ABNORMAL HIGH (ref 0–99)
Total Non-HDL-Chol (LDL+VLDL): 149 mg/dL — ABNORMAL HIGH (ref 0–129)
Triglycerides: 177 mg/dL — ABNORMAL HIGH (ref 0–149)
VLDL Cholesterol Cal: 31 mg/dL (ref 5–40)

## 2022-04-22 LAB — HEMOGLOBIN A1C
Est. average glucose Bld gHb Est-mCnc: 126 mg/dL
Hgb A1c MFr Bld: 6 % — ABNORMAL HIGH (ref 4.8–5.6)

## 2022-04-25 ENCOUNTER — Telehealth: Payer: Self-pay | Admitting: Family Medicine

## 2022-04-25 NOTE — Telephone Encounter (Signed)
Pt is calling to request referral to place for psychology. Please advise [phone]- [phone]

## 2022-04-25 NOTE — Telephone Encounter (Signed)
This patient was started on Wellbutrin for anxiety and depression and she has multiple social stressors. She would like therapy as well. Thanks

## 2022-04-25 NOTE — Telephone Encounter (Signed)
Courtney please see my previous note for therapy which I sent to [facility] but was meant for you. Thanks

## 2022-04-25 NOTE — Telephone Encounter (Signed)
Routing to PCP for review.

## 2022-04-28 NOTE — Telephone Encounter (Signed)
Pt called and informed that Social Worker will be reaching out to her.
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
## 2022-05-01 ENCOUNTER — Ambulatory Visit: Payer: Self-pay | Attending: Family Medicine | Admitting: Physical Therapy

## 2022-05-01 ENCOUNTER — Encounter: Payer: Self-pay | Admitting: Physical Therapy

## 2022-05-01 DIAGNOSIS — R293 Abnormal posture: Secondary | ICD-10-CM | POA: Insufficient documentation

## 2022-05-01 DIAGNOSIS — R278 Other lack of coordination: Secondary | ICD-10-CM | POA: Insufficient documentation

## 2022-05-01 DIAGNOSIS — R262 Difficulty in walking, not elsewhere classified: Secondary | ICD-10-CM | POA: Insufficient documentation

## 2022-05-01 DIAGNOSIS — G8929 Other chronic pain: Secondary | ICD-10-CM | POA: Insufficient documentation

## 2022-05-01 DIAGNOSIS — M25561 Pain in right knee: Secondary | ICD-10-CM | POA: Insufficient documentation

## 2022-05-01 DIAGNOSIS — M545 Low back pain, unspecified: Secondary | ICD-10-CM | POA: Insufficient documentation

## 2022-05-01 DIAGNOSIS — M5459 Other low back pain: Secondary | ICD-10-CM | POA: Insufficient documentation

## 2022-05-01 DIAGNOSIS — M6281 Muscle weakness (generalized): Secondary | ICD-10-CM | POA: Insufficient documentation

## 2022-05-01 NOTE — Telephone Encounter (Signed)
Patient declines outside counseling resources due to not having insurance. She is schedule 2/5 @11am  with courtney hall.

## 2022-05-05 ENCOUNTER — Ambulatory Visit: Payer: Self-pay | Admitting: Physical Therapy

## 2022-05-05 ENCOUNTER — Encounter: Payer: Self-pay | Admitting: Physical Therapy

## 2022-05-05 DIAGNOSIS — M25561 Pain in right knee: Secondary | ICD-10-CM

## 2022-05-05 DIAGNOSIS — M5459 Other low back pain: Secondary | ICD-10-CM

## 2022-05-05 DIAGNOSIS — R293 Abnormal posture: Secondary | ICD-10-CM

## 2022-05-05 DIAGNOSIS — R262 Difficulty in walking, not elsewhere classified: Secondary | ICD-10-CM

## 2022-05-05 DIAGNOSIS — M6281 Muscle weakness (generalized): Secondary | ICD-10-CM

## 2022-05-05 DIAGNOSIS — R278 Other lack of coordination: Secondary | ICD-10-CM
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
## 2022-05-09 ENCOUNTER — Ambulatory Visit (HOSPITAL_BASED_OUTPATIENT_CLINIC_OR_DEPARTMENT_OTHER): Payer: Self-pay | Admitting: Internal Medicine

## 2022-05-12 ENCOUNTER — Encounter: Payer: Self-pay | Admitting: Physical Therapy

## 2022-05-12 ENCOUNTER — Ambulatory Visit: Payer: Self-pay | Admitting: Physical Therapy

## 2022-05-12 DIAGNOSIS — M25561 Pain in right knee: Secondary | ICD-10-CM

## 2022-05-12 DIAGNOSIS — R262 Difficulty in walking, not elsewhere classified: Secondary | ICD-10-CM

## 2022-05-12 DIAGNOSIS — R293 Abnormal posture: Secondary | ICD-10-CM

## 2022-05-12 DIAGNOSIS — M5459 Other low back pain: Secondary | ICD-10-CM

## 2022-05-12 DIAGNOSIS — M6281 Muscle weakness (generalized): Secondary | ICD-10-CM

## 2022-05-12 DIAGNOSIS — R278 Other lack of coordination: Secondary | ICD-10-CM
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
## 2022-05-15 ENCOUNTER — Ambulatory Visit: Payer: Self-pay | Admitting: Physical Therapy

## 2022-05-19 ENCOUNTER — Encounter (HOSPITAL_BASED_OUTPATIENT_CLINIC_OR_DEPARTMENT_OTHER): Payer: Self-pay | Admitting: Internal Medicine

## 2022-05-19 ENCOUNTER — Other Ambulatory Visit: Payer: Self-pay

## 2022-05-19 ENCOUNTER — Ambulatory Visit: Payer: Self-pay | Attending: Licensed Clinical Social Worker | Admitting: Licensed Clinical Social Worker

## 2022-05-19 DIAGNOSIS — Z5986 Financial insecurity: Secondary | ICD-10-CM

## 2022-05-19 DIAGNOSIS — F4322 Adjustment disorder with anxiety: Secondary | ICD-10-CM

## 2022-05-20 ENCOUNTER — Telehealth: Payer: Self-pay | Admitting: Licensed Clinical Social Worker

## 2022-05-20 ENCOUNTER — Ambulatory Visit: Payer: Self-pay | Attending: Family Medicine

## 2022-05-20 ENCOUNTER — Other Ambulatory Visit: Payer: Self-pay

## 2022-05-20 DIAGNOSIS — R262 Difficulty in walking, not elsewhere classified: Secondary | ICD-10-CM | POA: Insufficient documentation

## 2022-05-20 DIAGNOSIS — M6281 Muscle weakness (generalized): Secondary | ICD-10-CM | POA: Insufficient documentation

## 2022-05-20 DIAGNOSIS — R278 Other lack of coordination: Secondary | ICD-10-CM | POA: Insufficient documentation

## 2022-05-20 DIAGNOSIS — M5459 Other low back pain: Secondary | ICD-10-CM | POA: Insufficient documentation

## 2022-05-20 DIAGNOSIS — R293 Abnormal posture: Secondary | ICD-10-CM | POA: Insufficient documentation

## 2022-05-20 DIAGNOSIS — M25561 Pain in right knee: Secondary | ICD-10-CM | POA: Insufficient documentation

## 2022-05-20 MED ORDER — HYDROXYZINE HCL 25 MG PO TABS
25.0000 mg | ORAL_TABLET | Freq: Three times a day (TID) | ORAL | 1 refills | Status: AC | PRN
  Filled 2022-05-20: qty 60, 20d supply, fill #0

## 2022-05-20 NOTE — Telephone Encounter (Signed)
Met with pt yesterday. Pt shows high levels of anxiety, over thinking and not sleeping. Pt stated that she has a medication for depression and reports she doesn't take it often. I encouraged her to continue to take it on a regular. She wants me to reach out to you because she is interested in a medication for anxiety.

## 2022-05-20 NOTE — Patient Instructions (Signed)
Shared other local resources with patient as well as out of the garden resources, referrals for clothing shops and services for extra assistance.
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
## 2022-05-20 NOTE — Addendum Note (Signed)
Addended by: Charlott Rakes on: 05/20/2022 06:15 PM   Modules accepted: Orders

## 2022-05-20 NOTE — Telephone Encounter (Signed)
I have sent a prescription for hydroxyzine to the pharmacy to be used as needed.  Thanks
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
## 2022-05-21 ENCOUNTER — Other Ambulatory Visit: Payer: Self-pay

## 2022-05-27 ENCOUNTER — Ambulatory Visit: Payer: Self-pay
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
## 2022-05-28 ENCOUNTER — Other Ambulatory Visit: Payer: Self-pay

## 2022-06-03 ENCOUNTER — Other Ambulatory Visit: Payer: Self-pay

## 2022-06-03 ENCOUNTER — Ambulatory Visit: Payer: Self-pay

## 2022-06-03 DIAGNOSIS — M6281 Muscle weakness (generalized): Secondary | ICD-10-CM

## 2022-06-03 DIAGNOSIS — M5459 Other low back pain: Secondary | ICD-10-CM

## 2022-06-03 DIAGNOSIS — M25561 Pain in right knee: Secondary | ICD-10-CM

## 2022-06-03 DIAGNOSIS — R262 Difficulty in walking, not elsewhere classified: Secondary | ICD-10-CM

## 2022-06-03 DIAGNOSIS — R278 Other lack of coordination: Secondary | ICD-10-CM

## 2022-06-03 DIAGNOSIS — R293 Abnormal posture: Secondary | ICD-10-CM
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
## 2022-06-08 ENCOUNTER — Ambulatory Visit (HOSPITAL_BASED_OUTPATIENT_CLINIC_OR_DEPARTMENT_OTHER): Payer: Self-pay | Attending: Family Medicine | Admitting: Internal Medicine

## 2022-06-08 VITALS — Ht 62.0 in | Wt 365.0 lb

## 2022-06-08 DIAGNOSIS — R519 Headache, unspecified: Secondary | ICD-10-CM | POA: Insufficient documentation

## 2022-06-08 DIAGNOSIS — R0683 Snoring: Secondary | ICD-10-CM | POA: Insufficient documentation

## 2022-06-08 DIAGNOSIS — E669 Obesity, unspecified: Secondary | ICD-10-CM | POA: Insufficient documentation

## 2022-06-08 DIAGNOSIS — G4739 Other sleep apnea: Secondary | ICD-10-CM

## 2022-06-08 DIAGNOSIS — R5383 Other fatigue: Secondary | ICD-10-CM | POA: Insufficient documentation

## 2022-06-08 DIAGNOSIS — G4733 Obstructive sleep apnea (adult) (pediatric): Secondary | ICD-10-CM | POA: Insufficient documentation

## 2022-06-15 ENCOUNTER — Other Ambulatory Visit: Payer: Self-pay | Admitting: Family Medicine

## 2022-06-15 DIAGNOSIS — G4739 Other sleep apnea: Secondary | ICD-10-CM

## 2022-06-15 DIAGNOSIS — G8929 Other chronic pain: Secondary | ICD-10-CM

## 2022-06-15 NOTE — Procedures (Signed)
Patient Name: Stacie Mitchell, Stacie Mitchell Study Date: 06/08/2022 Gender: Female D.O.B: [birth year] Age (years): 36 Referring Provider: Arnoldo Morale Height (inches): 62 Interpreting Physician: Baird Lyons MD, ABSM Weight (lbs): 365 RPSGT: Jacolyn Reedy BMI: 67 MRN: [id number] Neck Size: 17.00  CLINICAL INFORMATION Sleep Study Type: Split Night CPAP Indication for sleep study: Fatigue, Morning Headaches, Obesity, Snoring Epworth Sleepiness Score: 3  SLEEP STUDY TECHNIQUE As per the AASM Manual for the Scoring of Sleep and Associated Events v2.3 (April 2016) with a hypopnea requiring 4% desaturations.  The channels recorded and monitored were frontal, central and occipital EEG, electrooculogram (EOG), submentalis EMG (chin), nasal and oral airflow, thoracic and abdominal wall motion, anterior tibialis EMG, snore microphone, electrocardiogram, and pulse oximetry. Continuous positive airway pressure (CPAP) was initiated when the patient met split night criteria and was titrated according to treat sleep-disordered breathing.  MEDICATIONS Medications self-administered by patient taken the night of the study : none reported  RESPIRATORY PARAMETERS Diagnostic  Total AHI (/hr): 34.4 RDI (/hr): 45.2 OA Index (/hr): 2.2 CA Index (/hr): 0.0 REM AHI (/hr): 39.6 NREM AHI (/hr): 33.1 Supine AHI (/hr): 50.3 Non-supine AHI (/hr): 17.9 Min O2 Sat (%): 81.0 Mean O2 (%): 95.5 Time below 88% (min): 3   Titration  Optimal Pressure (cm):  AHI at Optimal Pressure (/hr): N/A Min O2 at Optimal Pressure (%): N/A Supine % at Optimal (%): N/A Sleep % at Optimal (%): N/A   SLEEP ARCHITECTURE The recording time for the entire night was 362.8 minutes.  During a baseline period of 362.8 minutes, the patient slept for 245.7 minutes in REM and nonREM, yielding a sleep efficiency of 67.7%. Sleep onset after lights out was 7.6 minutes with a REM latency of 55.0 minutes. The patient spent 18.3% of  the night in stage N1 sleep, 60.3% in stage N2 sleep, 1.0% in stage N3 and 20.4% in REM.  During the titration period of 0.0 minutes, the patient slept for 0.0 minutes in REM and nonREM, yielding a sleep efficiency of N/A%. Sleep onset after CPAP initiation was N/A minutes with a REM latency of N/A minutes. The patient spent N/A% of the night in stage N1 sleep, N/A% in stage N2 sleep, N/A% in stage N3 and 0% in REM.  CARDIAC DATA The 2 lead EKG demonstrated sinus rhythm. The mean heart rate was 100.0 beats per minute. Other EKG findings include: None.  LEG MOVEMENT DATA The total Periodic Limb Movements of Sleep (PLMS) were 0. The PLMS index was 0.0 .  IMPRESSIONS - Severe obstructive sleep apnea occurred during the diagnostic portion of the study (AHI = 34.4/hour). - An optimal PAP pressure could not be selected. Patient qualified for split CPAP titration, but could not tolerate CPAP on this study night due to multiple productive coughing episodes. - The patient snored with loud snoring volume during the diagnostic portion of the study. - No cardiac abnormalities were noted during this study. - Clinically significant periodic limb movements did not occur during sleep.  DIAGNOSIS - Obstructive Sleep Apnea (G47.33)  RECOMMENDATIONS - Reassess after managing productive cough. Consider CPAP titration sleep study or autopap. Other options would be based on clinical judgment. - Be careful with  alcohol, sedatives and other CNS depressants that may worsen sleep apnea and disrupt normal sleep architecture. - Sleep hygiene should be reviewed to assess factors that may improve sleep quality. - Weight management and regular exercise should be initiated or continued.  [Electronically signed] 06/15/2022 11:41  AM  Baird Lyons MD, [place], American Board of Sleep Medicine NPI: [id number]                      [place], [place] [facility]  [facility]  ELECTRONICALLY SIGNED ON:  06/15/2022, 11:45 AM [place] PH: [phone]   FX: [phone] [place]

## 2022-06-16 ENCOUNTER — Other Ambulatory Visit: Payer: Self-pay

## 2022-06-16 ENCOUNTER — Ambulatory Visit: Payer: Self-pay | Attending: Licensed Clinical Social Worker | Admitting: Licensed Clinical Social Worker

## 2022-06-16 DIAGNOSIS — F4322 Adjustment disorder with anxiety: Secondary | ICD-10-CM

## 2022-06-16 MED ORDER — IBUPROFEN 800 MG PO TABS
800.0000 mg | ORAL_TABLET | Freq: Two times a day (BID) | ORAL | 2 refills | Status: DC | PRN
  Filled 2022-06-16 – 2022-07-11 (×3): qty 30, 15d supply, fill #0
  Filled 2022-08-06: qty 30, 15d supply, fill #1
  Filled 2022-09-10 – 2022-09-16 (×3): qty 30, 15d supply, fill #2

## 2022-06-20 ENCOUNTER — Other Ambulatory Visit: Payer: Self-pay

## 2022-06-24 ENCOUNTER — Other Ambulatory Visit: Payer: Self-pay

## 2022-06-30 ENCOUNTER — Other Ambulatory Visit: Payer: Self-pay

## 2022-07-07 ENCOUNTER — Other Ambulatory Visit: Payer: Self-pay | Admitting: Family Medicine

## 2022-07-07 ENCOUNTER — Other Ambulatory Visit: Payer: Self-pay

## 2022-07-07 ENCOUNTER — Other Ambulatory Visit: Payer: Self-pay | Admitting: Physician Assistant

## 2022-07-07 DIAGNOSIS — R7303 Prediabetes: Secondary | ICD-10-CM
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
## 2022-07-08 ENCOUNTER — Other Ambulatory Visit: Payer: Self-pay

## 2022-07-08 MED ORDER — METFORMIN HCL 500 MG PO TABS
500.0000 mg | ORAL_TABLET | Freq: Every day | ORAL | 0 refills | Status: DC
  Filled 2022-07-08: qty 90, fill #0
  Filled 2022-07-13 – 2022-08-06 (×2): qty 90, 90d supply, fill #0

## 2022-07-08 MED ORDER — TRULICITY 0.75 MG/0.5ML ~~LOC~~ SOAJ
0.7500 mg | SUBCUTANEOUS | 1 refills | Status: DC
  Filled 2022-07-08: qty 6, 84d supply, fill #0
  Filled 2022-07-13: qty 2, 28d supply, fill #0
  Filled 2022-08-06: qty 2, 28d supply, fill #1
  Filled 2022-08-25 – 2022-09-01 (×3): qty 2, 28d supply, fill #2
  Filled 2022-09-10: qty 2, 28d supply, fill #3

## 2022-07-11 ENCOUNTER — Other Ambulatory Visit: Payer: Self-pay

## 2022-07-14 ENCOUNTER — Other Ambulatory Visit: Payer: Self-pay

## 2022-07-15 ENCOUNTER — Ambulatory Visit (HOSPITAL_COMMUNITY): Admission: EM | Admit: 2022-07-15 | Discharge: 2022-07-15 | Discharge status: Absent without leave | Payer: Self-pay

## 2022-07-15 ENCOUNTER — Other Ambulatory Visit: Payer: Self-pay

## 2022-07-25 ENCOUNTER — Other Ambulatory Visit: Payer: Self-pay

## 2022-07-30 NOTE — Telephone Encounter (Signed)
noted 

## 2022-08-06 ENCOUNTER — Encounter: Payer: Self-pay | Admitting: Physician Assistant

## 2022-08-06 ENCOUNTER — Other Ambulatory Visit (HOSPITAL_COMMUNITY)
Admission: RE | Admit: 2022-08-06 | Discharge: 2022-08-06 | Disposition: A | Discharge status: Home / Self Care | Payer: Self-pay | Source: Ambulatory Visit | Attending: Physician Assistant | Admitting: Physician Assistant

## 2022-08-06 ENCOUNTER — Ambulatory Visit: Payer: Self-pay | Attending: Physician Assistant | Admitting: Physician Assistant

## 2022-08-06 ENCOUNTER — Other Ambulatory Visit: Payer: Self-pay

## 2022-08-06 ENCOUNTER — Telehealth: Payer: Self-pay | Admitting: Physician Assistant

## 2022-08-06 VITALS — BP 138/80 | HR 79 | Wt 369.6 lb

## 2022-08-06 DIAGNOSIS — R7303 Prediabetes: Secondary | ICD-10-CM

## 2022-08-06 DIAGNOSIS — N898 Other specified noninflammatory disorders of vagina: Secondary | ICD-10-CM

## 2022-08-06 DIAGNOSIS — F419 Anxiety disorder, unspecified: Secondary | ICD-10-CM

## 2022-08-06 DIAGNOSIS — F32A Depression, unspecified: Secondary | ICD-10-CM

## 2022-08-06 DIAGNOSIS — N9089 Other specified noninflammatory disorders of vulva and perineum: Secondary | ICD-10-CM

## 2022-08-06 DIAGNOSIS — R102 Pelvic and perineal pain: Secondary | ICD-10-CM

## 2022-08-06 LAB — POCT URINALYSIS DIP (CLINITEK)
Bilirubin, UA: NEGATIVE
Blood, UA: NEGATIVE
Glucose, UA: NEGATIVE mg/dL
Ketones, POC UA: NEGATIVE mg/dL
Leukocytes, UA: NEGATIVE
Nitrite, UA: NEGATIVE
POC PROTEIN,UA: 30 — AB
Spec Grav, UA: 1.025 (ref 1.010–1.025)
Urobilinogen, UA: 0.2 E.U./dL
pH, UA: 7 (ref 5.0–8.0)

## 2022-08-06 LAB — POCT URINE PREGNANCY: Preg Test, Ur: NEGATIVE

## 2022-08-06 LAB — GLUCOSE, POCT (MANUAL RESULT ENTRY): POC Glucose: 114 mg/dl — AB (ref 70–99)

## 2022-08-06 MED ORDER — FLUCONAZOLE 150 MG PO TABS
150.0000 mg | ORAL_TABLET | Freq: Once | ORAL | 0 refills | Status: AC
  Filled 2022-08-06: qty 1, 1d supply, fill #0

## 2022-08-06 MED ORDER — VALACYCLOVIR HCL 500 MG PO TABS
500.0000 mg | ORAL_TABLET | Freq: Two times a day (BID) | ORAL | 0 refills | Status: DC
  Filled 2022-08-06: qty 21, 11d supply, fill #0

## 2022-08-06 NOTE — Patient Instructions (Signed)
Drink 80 to 100 ounces water daily 
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
## 2022-08-07 LAB — CERVICOVAGINAL ANCILLARY ONLY
Bacterial Vaginitis (gardnerella): POSITIVE — AB
Candida Glabrata: NEGATIVE
Candida Vaginitis: NEGATIVE
Chlamydia: NEGATIVE
Comment: NEGATIVE
Comment: NEGATIVE
Comment: NEGATIVE
Comment: NEGATIVE
Comment: NEGATIVE
Comment: NORMAL
Neisseria Gonorrhea: NEGATIVE
Trichomonas: NEGATIVE

## 2022-08-07 NOTE — Telephone Encounter (Signed)
Na

## 2022-08-08 ENCOUNTER — Other Ambulatory Visit: Payer: Self-pay

## 2022-08-08 ENCOUNTER — Other Ambulatory Visit: Payer: Self-pay | Admitting: Physician Assistant

## 2022-08-08 MED ORDER — METRONIDAZOLE 500 MG PO TABS
500.0000 mg | ORAL_TABLET | Freq: Two times a day (BID) | ORAL | 0 refills | Status: DC
  Filled 2022-08-08: qty 14, 7d supply, fill #0

## 2022-08-10 LAB — HERPES SIMPLEX VIRUS CULTURE

## 2022-08-11 ENCOUNTER — Other Ambulatory Visit: Payer: Self-pay

## 2022-08-11 ENCOUNTER — Ambulatory Visit: Payer: Self-pay | Attending: Licensed Clinical Social Worker | Admitting: Licensed Clinical Social Worker

## 2022-08-11 ENCOUNTER — Telehealth: Payer: Self-pay

## 2022-08-11 DIAGNOSIS — F32A Depression, unspecified: Secondary | ICD-10-CM

## 2022-08-11 DIAGNOSIS — F419 Anxiety disorder, unspecified: Secondary | ICD-10-CM

## 2022-08-11 DIAGNOSIS — F4322 Adjustment disorder with anxiety: Secondary | ICD-10-CM

## 2022-08-11 DIAGNOSIS — Z5986 Financial insecurity: Secondary | ICD-10-CM

## 2022-08-11 NOTE — Telephone Encounter (Signed)
Pt was called and is aware of results, DOB was confirmed.  Interpreter id # [phone]
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
## 2022-08-11 NOTE — Telephone Encounter (Signed)
-----   Message from Guy Franco, RN sent at 08/08/2022 12:24 PM EDT -----  ----- Message ----- From: Anders Simmonds, PA-C Sent: 08/08/2022  11:10 AM EDT To: Guy Franco, RN  Vaginal swabs show bacterial vaginitis.  This is NOT and STD.  It is an imbalance in the bacteria in the vagina.  I sent you medication for this.  The herpes culture is not back yet.  Thanks, Georgian Co, PA-C

## 2022-08-18 DIAGNOSIS — Z5986 Financial insecurity: Secondary | ICD-10-CM | POA: Insufficient documentation

## 2022-08-18 DIAGNOSIS — F4322 Adjustment disorder with anxiety: Secondary | ICD-10-CM | POA: Insufficient documentation

## 2022-08-18 NOTE — Patient Instructions (Signed)
Advocacy/Legal Legal Aid [place]:  1-866-219-5262  /  336-272-0148 /  LVM, taking clients  [facility]:  336-641-7233 /  Onsite, counseling with Kellen is virtual, Accepting new clients   [facility] [place] 24-hr Crisis line:  336-273-7273 Virtual & Onsite services (Client preference), Accepting New clients  [facility], [place]:  336-275-6090 Virtual & Onsite services (Client preference), Accepting New clients  Court [facility] (custody):  336-275-2346 Virtual, Accepting new clients  [facility]:   336-279-9299 Virtual/Telephone, accepting clients for waitlisting (time depends on services)   Baby & Breastfeeding [facility] 336-832-6860 Outpatient consultant out for weeks (will be hard to get an appointment) , Support group offered Virtually (Accepting new members)  [facility] 336-878-6712 Telephone & Onsite services (Client preference), Accepting New clients  WIC: 336-641-3663 ([place]);  336-641-7571 ([place]) Virtual  La Leche League:  1-877-452-5321     Childcare [place] [facility]: 336-378-7700 ([place]) / 336-887-8224 ([place])             - Child Care Resources/ Referrals/ Scholarships             - Head Start/ Early Head Start (call or apply online) Virtually (by appointment), Accepting new families  [place] DHHS: [place] Pre-K :  1-800-859-0829 / 336-274-5437     Employment / Job Search [facility] [place]: 336-275-6090 / [address] Virtual & Onsite services (Client preference), Accepting New clients  [place] [facility] ([employer]): 336-373-5922 ([place]) / 336-882-4141 ([place]) Virtual & Onsite workshops, Accepting new clients  [facility]/ Career [facility]: 336-275-9801 / 336-282-7307 Virtual & Onsite , Accepting New clients  [facility] Job & Career Center: 336-373-3764   DHHS Work First: 336-641-3447 ([place]) / 336-641-3447 ([place]) Virtually, Accepting clients   [facility] [place]:   336-676-5871  Virtual and Onsite, Accepting new clients     Financial Assistance [facility]:  336-553-2657 Virtual (financial assistance) & Onsite (all other services), Accepting new families  [facility]: 336-235-0368 Virtual  [facility] [facility] (furniture):  336-370-4002   [place] [facility]: 336-373-4264   Low Income Energy Assistance: 336-641-3000 Virtual, accepting new families    Food Assistance DHHS- SNAP/ Food Stamps: 336-641-4588 Virtual  WIC: [place]- 336-641-3663 ;  [place] 336-641-7571 Virtual        During the summer, text "FOOD" to 877877     General Health / Clinics (Adults) Orange Card (for Adults) through [place] [facility]: (336) 895-4900    [facility]:   336-832-8035    Community Health & Wellness:   336-832-4444   Health Department:  336-641-3245   [facility]:  336-415-3877 / 336-641-2100   [facility] [place]:   336-373-0678 Onsite, Accepting new patients  [facility]:   336-334-4822 x 50251 Onsite , Accepting new patients    Housing [place] Housing Coalition:   336-691-9521   Ideal Housing Authority:  336-275-8501   Affordable Housing Managemnt:  336-273-0568     Immigrant/ Refugee Center for [place] ([employer]):  336-256-1065 Onsite, Accepting new people  [facility]:  336-379-0037 Virtual, accepting new individuals  [facility]:  336-937-4701 Onsite & Virtual, Accepting new individuals  [facility]:  336-617-0381 Virtual, Accepting new clients  African Services Coalition:  336-574-2677     LGBTQ Youth SAFE  www.youthsafe[place].org  Virtual, Accepting new members  PFLAG  336-541-6754 / info@[place].org  Virtual, Accepting new Members  The Trevor   Project:  1-866-488-7386  Virtual    Mental Health/ Substance Use [facility]  336-387-6161 [address] [place] [address] Virtual &  Onsite services (Client preference), Accepting New clients  [facility]:  336-832-9700 or 1-800-711-2635 [address] [place], [place] [address] Onsite & Virtual, Accepting new clients  [facility]:  336-294-1349 [address] [place], [place] [address] Virtual & Onsite, Accepting new clients  [facility]:  336-542-2884 (Reketta Wright, LPC) [address] [place], [place] [address] Onsite & Virtual, Accepting new clients  [facility] (walk-ins)  336-676-6840 / [address]   [place]:  800-449-1287 Virtual meetings via Zoom- need meeting passcode- call 336-485-8249 to receive code "AFG"= Al-Anon Family Group  [place].org/find-meetings   Alcoholics Anonymous:  336-854-4278 Nc23.org  Narcotics Anonymous:  866-375-1272 24 hour helpline: 866-375-1272  Quit Smoking Hotline:  800-QUIT-NOW (800-784-8669)    [facility]                                              (336) 383-1665 [address], [place], [place] [address] Onsite & Virtual, Accepting new clients    COUNSELING AGENCIES in [place] (Accepting Medicaid)   Mental Health  (* = Spanish available;  + = Psychiatric services) * [facility]                                336-387-6161 [address], [place], [place] [address] Virtual & Onsite services (Client preference), Accepting New clients  *+ [facility]:                                        336-832-9700 or 1-800-711-2635 Virtual & Onsite, Accepting clients  +[facility]                                336-271-5888   [facility]:                                                 336-294-1349   + [facility]:                                           336-542-2884 Onsite & Virtual, Accepting new clients  [facility]                               (336) 547-6361 Onsite, Accepting new clients  * [facility]:                                                      336-899-8800 Virtual, NOT accepting   new clients  The Social Emotional Learning (SEL) Group           336-285-7173 Virtual, accepting new clients  Youth Focus:                                                            336-333-6853 Onsite & Virtual, Accepting new clients  * [facility]:                                        336-334-5662 Onsite & Virtual, Waitlist 6-8 months for services  [facility]:                             336-855-4649   *[facility]                                                (336) 285-7616 Onsite & Virtual, Accepting new clients  + [facility]:             336-662-8185 or 336-632-3505   *[facility]                                                    336-617-3152 Onsite & Virtual, Accepting new clients  *+ [facility] (walk-ins)                                                336-676-6840 / [address]   [facility]                                              (336) 383-1665 Onsite & Virtual, Accepting new clients  Youth Unlimited (PCIT)                                              (336) 883-1361 Onsite & Virtual, At Capacity (check in occasionally , subject to change)    Substance Use [place]:                                800-449-1287  Alcoholics Anonymous:      336-854-4278  Narcotics Anonymous:       800-365-1036  Quit Smoking Hotline:         800-QUIT-NOW (800-784-8669)    [facility]- 1-800-256-2452 Provides information on mental health, intellectual/developmental disabilities & substance abuse services in [place]     Parenting Children's Home Society:    800-632-1400 Virtual , Accepting families  YWCA: 336-273-3461   [employer]CG: Bringing Out the Best:  336-334-3120              Thriving at Three (Hispanic families): 336-256-1066 Onsite, Accepting new children ( short wait list)  Healthy Start ([facility]):  336-387-6161 x2288    Parents as Teachers:  336-691-0024 Virtually, accepting families ( waitlist for Spanish speaking families )  [place] [facility]- Learning Together (Immigrants): 336-369-5001     Poison Control 800-222-1222   Sports & Recreation [employer] Open Doors Application: ymcanwnc.org/join/open-doors-financial-assistance/ Onsite, Accepting new families  [place] Recreation Centers: http://www.[place].gov/index.aspx?page=3615 Onsite    Special Needs Family Support Network:  336-832-6507 Virtual, Accepting new families  Autism Society of [place]:   336-333-0197 x1402 or x1412 /  800-785-1035 Virtual, Accepting new families   [facility] [place]:  336-334-5773 Virtual, Accepting families  ARC of [place]:  336-373-1076 Virtual, Accepting new families  Children's Developmental Service Agency (CDSA):  336-334-5601 Virtual, Accepting new families  CC4C (Care Coordination for Children):  336-641-7641 Virtual, accepting new patients     Transportation Medicaid Transportation: 336-641-4848 to apply   Transit Authority: 336-335-6499 (reduced-fare bus ID to Medicaid/ Medicare/ Orange Card)  SCAT Paratransit services: Eligible riders only, call 336-333-6589 for application    Tutoring/ Mentoring [facility] [employer]: 336-230-2138 No tutoring only afterschool programming (In Person), Accepting new students  Big Brothers/ Big Sisters: 336-378-9100 ([place])  336-882-4167 ([place])   ACES through child's school: 336-370-2321   [employer] Achievers: contact your local Y In Person, Accepting New students  SHIELD Mentor Program: 336-337-2771 Will re-launch in the fall   Updated 07/2019  

## 2022-08-25 ENCOUNTER — Other Ambulatory Visit: Payer: Self-pay | Admitting: Family Medicine

## 2022-08-25 ENCOUNTER — Other Ambulatory Visit: Payer: Self-pay

## 2022-08-25 DIAGNOSIS — R7303 Prediabetes: Secondary | ICD-10-CM

## 2022-08-25 MED ORDER — METFORMIN HCL 500 MG PO TABS
500.0000 mg | ORAL_TABLET | Freq: Every day | ORAL | 0 refills | Status: DC
Start: 2022-08-25 — End: 2022-09-11
  Filled 2022-08-25 – 2022-09-10 (×2): qty 90, 90d supply, fill #0

## 2022-08-27 ENCOUNTER — Ambulatory Visit: Payer: Self-pay | Admitting: Family Medicine

## 2022-08-29 ENCOUNTER — Other Ambulatory Visit: Payer: Self-pay

## 2022-09-01 ENCOUNTER — Other Ambulatory Visit: Payer: Self-pay

## 2022-09-10 ENCOUNTER — Other Ambulatory Visit: Payer: Self-pay | Admitting: Family Medicine

## 2022-09-10 ENCOUNTER — Other Ambulatory Visit: Payer: Self-pay | Admitting: Physician Assistant

## 2022-09-10 DIAGNOSIS — J302 Other seasonal allergic rhinitis: Secondary | ICD-10-CM

## 2022-09-11 ENCOUNTER — Other Ambulatory Visit: Payer: Self-pay

## 2022-09-11 ENCOUNTER — Ambulatory Visit: Payer: Self-pay | Attending: Family Medicine | Admitting: Physician Assistant

## 2022-09-11 ENCOUNTER — Encounter: Payer: Self-pay | Admitting: Physician Assistant

## 2022-09-11 VITALS — BP 123/61 | HR 84 | Wt 366.2 lb

## 2022-09-11 DIAGNOSIS — I83891 Varicose veins of right lower extremities with other complications: Secondary | ICD-10-CM

## 2022-09-11 DIAGNOSIS — I10 Essential (primary) hypertension: Secondary | ICD-10-CM

## 2022-09-11 DIAGNOSIS — R7303 Prediabetes: Secondary | ICD-10-CM

## 2022-09-11 DIAGNOSIS — Z6841 Body Mass Index (BMI) 40.0 and over, adult: Secondary | ICD-10-CM

## 2022-09-11 DIAGNOSIS — J302 Other seasonal allergic rhinitis: Secondary | ICD-10-CM

## 2022-09-11 DIAGNOSIS — Z758 Other problems related to medical facilities and other health care: Secondary | ICD-10-CM

## 2022-09-11 DIAGNOSIS — E559 Vitamin D deficiency, unspecified: Secondary | ICD-10-CM

## 2022-09-11 DIAGNOSIS — Z603 Acculturation difficulty: Secondary | ICD-10-CM

## 2022-09-11 DIAGNOSIS — L918 Other hypertrophic disorders of the skin: Secondary | ICD-10-CM

## 2022-09-11 LAB — POCT GLYCOSYLATED HEMOGLOBIN (HGB A1C): HbA1c, POC (controlled diabetic range): 5.6 % (ref 0.0–7.0)

## 2022-09-11 LAB — GLUCOSE, POCT (MANUAL RESULT ENTRY): POC Glucose: 95 mg/dl (ref 70–99)

## 2022-09-11 MED ORDER — TRULICITY 0.75 MG/0.5ML ~~LOC~~ SOAJ
0.7500 mg | SUBCUTANEOUS | 1 refills | Status: DC
Start: 2022-09-11 — End: 2023-02-17
  Filled 2022-09-11: qty 6, 84d supply, fill #0
  Filled 2022-09-16 – 2022-10-20 (×4): qty 2, 28d supply, fill #0
  Filled 2022-11-21 (×2): qty 2, 28d supply, fill #1
  Filled 2022-12-24: qty 2, 28d supply, fill #2
  Filled 2023-02-02: qty 2, 28d supply, fill #3

## 2022-09-11 MED ORDER — HYDROCHLOROTHIAZIDE 25 MG PO TABS
25.0000 mg | ORAL_TABLET | Freq: Every day | ORAL | 1 refills | Status: DC
Start: 2022-09-11 — End: 2023-02-17
  Filled 2022-09-11 – 2023-02-02 (×2): qty 90, 90d supply, fill #0

## 2022-09-11 MED ORDER — FLUTICASONE PROPIONATE 50 MCG/ACT NA SUSP
2.0000 | Freq: Every day | NASAL | 2 refills | Status: DC
Start: 2022-09-11 — End: 2023-02-17
  Filled 2022-09-11: qty 16, 30d supply, fill #0

## 2022-09-11 MED ORDER — METFORMIN HCL 500 MG PO TABS
500.0000 mg | ORAL_TABLET | Freq: Every day | ORAL | 1 refills | Status: DC
Start: 2022-09-11 — End: 2023-02-17
  Filled 2022-09-11 – 2022-10-20 (×5): qty 90, 90d supply, fill #0
  Filled 2023-01-20: qty 90, 90d supply, fill #1

## 2022-09-11 NOTE — Telephone Encounter (Signed)
Requested medication (s) are due for refill today: No  Requested medication (s) are on the active medication list: No    Last refill: 09/06/21  #16  0 refills  Future visit scheduled yes 12/16/22  Notes to clinic:    Med discontinued 09/11/22. Not on current med profile.  Cannot refuse non-delegated meds per protocol.  Requested Prescriptions  Pending Prescriptions Disp Refills   Vitamin D, Ergocalciferol, (DRISDOL) 1.25 MG (50000 UNIT) CAPS capsule 16 capsule 0    Sig: Take 1 capsule (50,000 Units total) by mouth every 7 (seven) days.     Endocrinology:  Vitamins - Vitamin D Supplementation 2 Failed - 09/10/2022  9:10 PM      Failed - Manual Review: Route requests for 50,000 IU strength to the provider      Failed - Vitamin D in normal range and within 360 days    Vit D, 25-Hydroxy  Date Value Ref Range Status  09/05/2021 18.2 (L) 30.0 - 100.0 ng/mL Final    Comment:    Vitamin D deficiency has been defined by the Institute of Medicine and an Endocrine Society practice guideline as a level of serum 25-OH vitamin D less than 20 ng/mL (1,2). The Endocrine Society went on to further define vitamin D insufficiency as a level between 21 and 29 ng/mL (2). 1. IOM (Institute of Medicine). 2010. Dietary reference    intakes for calcium and D. [place]: The    [employer]. 2. Holick MF, Binkley Garland, Bischoff-Ferrari HA, et al.    Evaluation, treatment, and prevention of vitamin D    deficiency: an Endocrine Society clinical practice    guideline. JCEM. 2011 Jul; 96(7):1911-30.          Passed - Ca in normal range and within 360 days    Calcium  Date Value Ref Range Status  04/21/2022 9.6 8.7 - 10.2 mg/dL Final   Calcium, Ion  Date Value Ref Range Status  06/14/2014 1.23 1.12 - 1.23 mmol/L Final         Passed - Valid encounter within last 12 months    Recent Outpatient Visits           Today Prediabetes   [name],  [place], [place]   1 month ago Prediabetes   [name], [place], [place]   4 months ago Skin tag   [facility] [place], Odette Horns, [place]   5 months ago Other sleep apnea   [facility] [name], [place], [place]   6 months ago Other chronic sinusitis   [place] [facility] [name], MD       Future Appointments             In 3 months [name], MD [facility]
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
## 2022-09-12 ENCOUNTER — Other Ambulatory Visit: Payer: Self-pay

## 2022-09-12 ENCOUNTER — Other Ambulatory Visit: Payer: Self-pay | Admitting: Physician Assistant

## 2022-09-12 LAB — BASIC METABOLIC PANEL
BUN/Creatinine Ratio: 22 (ref 9–23)
BUN: 15 mg/dL (ref 6–20)
CO2: 22 mmol/L (ref 20–29)
Calcium: 8.8 mg/dL (ref 8.7–10.2)
Chloride: 102 mmol/L (ref 96–106)
Creatinine, Ser: 0.69 mg/dL (ref 0.57–1.00)
Glucose: 89 mg/dL (ref 70–99)
Potassium: 4 mmol/L (ref 3.5–5.2)
Sodium: 140 mmol/L (ref 134–144)
eGFR: 113 mL/min/{1.73_m2} (ref >59)

## 2022-09-12 LAB — TSH: TSH: 2.35 u[IU]/mL (ref 0.450–4.500)

## 2022-09-12 LAB — VITAMIN D 25 HYDROXY (VIT D DEFICIENCY, FRACTURES): Vit D, 25-Hydroxy: 19.5 ng/mL — ABNORMAL LOW (ref 30.0–100.0)

## 2022-09-12 MED ORDER — VITAMIN D (ERGOCALCIFEROL) 1.25 MG (50000 UNIT) PO CAPS
50000.0000 [IU] | ORAL_CAPSULE | ORAL | 0 refills | Status: DC
  Filled 2022-09-12: qty 12, 84d supply, fill #0

## 2022-09-12 MED ORDER — VITAMIN D (ERGOCALCIFEROL) 1.25 MG (50000 UNIT) PO CAPS
50000.0000 [IU] | ORAL_CAPSULE | ORAL | 0 refills | Status: DC
  Filled 2022-09-16: qty 12, 84d supply, fill #0

## 2022-09-16 ENCOUNTER — Other Ambulatory Visit: Payer: Self-pay | Admitting: Family Medicine

## 2022-09-16 ENCOUNTER — Other Ambulatory Visit: Payer: Self-pay

## 2022-09-16 DIAGNOSIS — J302 Other seasonal allergic rhinitis: Secondary | ICD-10-CM

## 2022-09-16 MED ORDER — CETIRIZINE HCL 10 MG PO TABS
10.0000 mg | ORAL_TABLET | Freq: Every day | ORAL | 1 refills | Status: DC
Start: 2022-09-16 — End: 2023-02-17
  Filled 2022-09-16: qty 90, 90d supply, fill #0

## 2022-09-16 NOTE — Telephone Encounter (Signed)
Requested Prescriptions  Pending Prescriptions Disp Refills   cetirizine (ZYRTEC) 10 MG tablet 90 tablet 1    Sig: Take 1 tablet (10 mg total) by mouth daily.     Ear, Nose, and Throat:  Antihistamines 2 Passed - 09/16/2022 11:25 AM      Passed - Cr in normal range and within 360 days    Creat  Date Value Ref Range Status  08/09/2015 0.58 0.50 - 1.10 mg/dL Final   Creatinine, Ser  Date Value Ref Range Status  09/11/2022 0.69 0.57 - 1.00 mg/dL Final   Creatinine, Urine  Date Value Ref Range Status  10/05/2009 54.3 mg/dL Final         Passed - Valid encounter within last 12 months    Recent Outpatient Visits           5 days ago Prediabetes   [facility] [place], [place], [place]   1 month ago Prediabetes   [facility] [place], [place], [place]   4 months ago Skin tag   [facility] [place], Odette Horns, MD   5 months ago Other sleep apnea   [facility] [place], [place], [place]   6 months ago Other chronic sinusitis   [facility] [name], MD       Future Appointments             In 3 months [name], MD [facility]

## 2022-09-17 ENCOUNTER — Other Ambulatory Visit: Payer: Self-pay

## 2022-09-17 ENCOUNTER — Telehealth: Payer: Self-pay | Admitting: Licensed Clinical Social Worker

## 2022-09-17 NOTE — Telephone Encounter (Signed)
Returned pts call. Pt wants to schedule an appointment to assist her with finding summer camps for her daughter that are affordable.

## 2022-09-29 ENCOUNTER — Ambulatory Visit: Payer: Self-pay | Admitting: Licensed Clinical Social Worker

## 2022-10-01 ENCOUNTER — Other Ambulatory Visit: Payer: Self-pay

## 2022-10-08 ENCOUNTER — Other Ambulatory Visit: Payer: Self-pay

## 2022-10-08 ENCOUNTER — Other Ambulatory Visit: Payer: Self-pay | Admitting: Physician Assistant

## 2022-10-14 ENCOUNTER — Other Ambulatory Visit: Payer: Self-pay

## 2022-10-20 ENCOUNTER — Ambulatory Visit: Payer: Self-pay | Admitting: Family Medicine

## 2022-10-20 ENCOUNTER — Other Ambulatory Visit: Payer: Self-pay

## 2022-10-20 ENCOUNTER — Other Ambulatory Visit: Payer: Self-pay | Admitting: Family Medicine

## 2022-10-20 DIAGNOSIS — G8929 Other chronic pain: Secondary | ICD-10-CM

## 2022-10-20 MED ORDER — IBUPROFEN 800 MG PO TABS
800.0000 mg | ORAL_TABLET | Freq: Two times a day (BID) | ORAL | 2 refills | Status: DC | PRN
Start: 2022-10-20 — End: 2023-02-17
  Filled 2022-10-20 – 2022-11-21 (×3): qty 30, 15d supply, fill #0
  Filled 2022-12-24: qty 30, 15d supply, fill #1
  Filled 2023-01-20 – 2023-02-02 (×2): qty 30, 15d supply, fill #2

## 2022-10-23 ENCOUNTER — Other Ambulatory Visit: Payer: Self-pay | Admitting: *Deleted

## 2022-10-23 DIAGNOSIS — M79605 Pain in left leg: Secondary | ICD-10-CM

## 2022-10-24 ENCOUNTER — Other Ambulatory Visit: Payer: Self-pay

## 2022-11-12 ENCOUNTER — Ambulatory Visit (HOSPITAL_COMMUNITY)
Admission: RE | Admit: 2022-11-12 | Discharge: 2022-11-12 | Disposition: A | Discharge status: Home / Self Care | Payer: Self-pay | Source: Ambulatory Visit | Attending: Vascular Surgery | Admitting: Vascular Surgery

## 2022-11-12 ENCOUNTER — Encounter: Payer: Self-pay | Admitting: Vascular Surgery

## 2022-11-12 DIAGNOSIS — M79604 Pain in right leg: Secondary | ICD-10-CM | POA: Insufficient documentation

## 2022-11-12 DIAGNOSIS — M79605 Pain in left leg: Secondary | ICD-10-CM | POA: Insufficient documentation

## 2022-11-21 ENCOUNTER — Other Ambulatory Visit: Payer: Self-pay | Admitting: Family Medicine

## 2022-11-21 ENCOUNTER — Other Ambulatory Visit: Payer: Self-pay

## 2022-11-21 MED ORDER — MISC. DEVICES MISC: 0 refills | Status: AC

## 2022-11-21 NOTE — Progress Notes (Unsigned)
Can you please inform her that sleep study revealed she has sleep apnea.  I have written a prescription for CPAP machine for her.  Thank you.

## 2022-11-24 NOTE — Progress Notes (Signed)
I called the patient with the assistance of Spanish Interpreter: [phone]/[employer] to inform her of her sleep study results and the need for the CPAP machine. She said she was driving and requested I call her back later. I need to see if she has ever applied for Medicaid.  She is currently uninsured.

## 2022-11-24 NOTE — Progress Notes (Signed)
I called the patient with assistance of Spanish Interpreter: [phone]/[employer] and explained the results of the sleep study and need for CPAP machine. The patient is uninsured and unemployed and not able to afford a CPAP machine. I explained that I can refer her to a DSS Medicaid Eligibility caseworker to see if she would qualify for Medicaid and if she qualifies, we can order a new machine for her.  If she does not qualify for Medicaid, then we can order a refurbished CPAP machine through the American [employer] and assist her with the cost.   She said she understood and was agreeable to having DSS call her first. I then sent a referral to the DSS caseworkers requesting assistance

## 2022-11-25 ENCOUNTER — Other Ambulatory Visit: Payer: Self-pay

## 2022-11-25 NOTE — Progress Notes (Signed)
I received a message from Julianne Handler, DSS [facility] Caseworker stating she has not been able to reach the patient yet

## 2022-12-10 NOTE — Progress Notes (Signed)
Message received from [place], [place] Eligibility stating that the patient is unable to apply for Medicaid as she is not lawfully present.    I tried to reach the patient: [phone] to discuss submitting the order to the American [employer] Association / CPAP assistance program. Call placed with assistance of Spanish Interpreter: [phone]/[employer], unable to leave a message, the voicemail was not set up.   She will need to come to the clinic to sign the release for the ASAA and I also want to refer her to Jenene Slicker, Financial Counselor for assistance with applying for CAFA/ OC

## 2022-12-16 ENCOUNTER — Ambulatory Visit: Payer: Self-pay | Admitting: Family Medicine

## 2022-12-16 DIAGNOSIS — R7303 Prediabetes: Secondary | ICD-10-CM

## 2022-12-16 DIAGNOSIS — I10 Essential (primary) hypertension: Secondary | ICD-10-CM

## 2022-12-17 ENCOUNTER — Encounter: Payer: Self-pay | Admitting: Vascular Surgery

## 2022-12-17 NOTE — Progress Notes (Signed)
I called the patient with assistance of Spanish Interpreter: [phone]/[employer] I explained to her that I understand she does not qualify for Medicaid so we can refer her to assistance with applying for CAFA/ OC to assist with medical costs.  She was in agreement and message was sent to [facility] Counselor requesting he contact the patient about CAFA/ OC.  I then explained to the patient that I can refer her to the American Sleep Apnea Association (ASAA)  - CPAP Assistance Program to obtain a refurbished machine.  I went on to explain that the machines come as is, no guarantee, and we will arrange for her to receive education regarding the proper use and care of the machine when it is delivered.  [facility] can pay the $200 donation fee to the ASAA if she is not able to afford that and she said she is not working and unable to pay the $200 fee. I also explained that this is a one time donation that is made for a patient.   I informed her that I will need her to sign a waiver/acknowledgement for the ASAA and she said she can come to the [facility] tomorrow between 1500-1600 to sign the form. I also informed her that it can take a couple of weeks to possibly 4 months to receive a machine depending on the ASAA inventory. She said she understood and would be see me at [facility] tomorrow.

## 2022-12-24 ENCOUNTER — Other Ambulatory Visit: Payer: Self-pay

## 2022-12-24 NOTE — Progress Notes (Signed)
I called the patient with assistance of Spanish interpreter: [phone]/[employer] to inquire if she is still interested in obtaining the CPAP machine.  She said she is and I told her that I need her to come to the office to sign the required documentation  She said she would come today around 1530.

## 2022-12-24 NOTE — Progress Notes (Signed)
The patient came to [facility] today and signed the Patient Assistance Acknowledgement and ASAA Acknowledgement Leana Roe assisted with Spanish interpretation. The patient was going to see Jenene Slicker, Financial Counselor regarding CAFA / OC after she left this clinic

## 2022-12-25 NOTE — Progress Notes (Signed)
Order for CPAP submitted online to American  Sleep Apnea Association - CPAP Assistance Program

## 2022-12-29 NOTE — Progress Notes (Signed)
Rx for CPAP machine emailed to [employer]

## 2022-12-30 ENCOUNTER — Encounter: Payer: Self-pay | Admitting: Physician Assistant

## 2022-12-30 ENCOUNTER — Ambulatory Visit (INDEPENDENT_AMBULATORY_CARE_PROVIDER_SITE_OTHER): Payer: Self-pay | Admitting: Physician Assistant

## 2022-12-30 VITALS — BP 114/74 | HR 82 | Temp 98.0°F | Resp 20 | Ht 62.0 in | Wt 370.1 lb

## 2022-12-30 DIAGNOSIS — M79604 Pain in right leg: Secondary | ICD-10-CM

## 2022-12-30 DIAGNOSIS — I872 Venous insufficiency (chronic) (peripheral): Secondary | ICD-10-CM

## 2022-12-30 DIAGNOSIS — M79605 Pain in left leg: Secondary | ICD-10-CM
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
## 2022-12-31 NOTE — Progress Notes (Signed)
Message received from the [employer] noting that they have an machine for the patient and they are requesting payment.  I paid the $200 program fee today

## 2023-01-13 ENCOUNTER — Telehealth: Payer: Self-pay

## 2023-01-13 NOTE — Telephone Encounter (Signed)
CPAP machine received from [employer].   I attempted to reach the patient : [phone]  to inform her that the CPAP machine has arrived and the [facility] will be calling her to schedule an appointment to teach her how to use and care for the machine.  Call placed with assistance of Spanish Interpreter: [phone]/[employer] and the voicemail was not set up.

## 2023-01-14 NOTE — Telephone Encounter (Signed)
I called the patient with assistance of Spanish Interpreter: [phone]/[employer] and informed her that I received the CPAP machine and the [facility] will be calling her to schedule an appointment for her to come to their clinic and receive instruction on the use and care of the machine.  She will then be able to take it home.  I told her to call me if she has not heard from the Sleep Center in a week.  She said she understood and was very Adult nurse.

## 2023-01-20 ENCOUNTER — Other Ambulatory Visit: Payer: Self-pay

## 2023-02-02 ENCOUNTER — Other Ambulatory Visit (HOSPITAL_COMMUNITY): Payer: Self-pay

## 2023-02-02 ENCOUNTER — Other Ambulatory Visit: Payer: Self-pay

## 2023-02-17 ENCOUNTER — Ambulatory Visit: Payer: Self-pay | Attending: Family Medicine | Admitting: Family Medicine

## 2023-02-17 ENCOUNTER — Encounter: Payer: Self-pay | Admitting: Family Medicine

## 2023-02-17 ENCOUNTER — Other Ambulatory Visit: Payer: Self-pay

## 2023-02-17 ENCOUNTER — Ambulatory Visit: Payer: Self-pay | Admitting: Family Medicine

## 2023-02-17 VITALS — BP 141/85 | HR 88 | Ht 62.0 in

## 2023-02-17 DIAGNOSIS — G8929 Other chronic pain: Secondary | ICD-10-CM

## 2023-02-17 DIAGNOSIS — I10 Essential (primary) hypertension: Secondary | ICD-10-CM

## 2023-02-17 DIAGNOSIS — J302 Other seasonal allergic rhinitis: Secondary | ICD-10-CM

## 2023-02-17 DIAGNOSIS — M25561 Pain in right knee: Secondary | ICD-10-CM

## 2023-02-17 DIAGNOSIS — B078 Other viral warts: Secondary | ICD-10-CM

## 2023-02-17 DIAGNOSIS — R7303 Prediabetes: Secondary | ICD-10-CM

## 2023-02-17 DIAGNOSIS — S93401A Sprain of unspecified ligament of right ankle, initial encounter: Secondary | ICD-10-CM

## 2023-02-17 MED ORDER — CETIRIZINE HCL 10 MG PO TABS
10.0000 mg | ORAL_TABLET | Freq: Every day | ORAL | 1 refills | Status: DC
  Filled 2023-02-17: qty 30, 30d supply, fill #0
  Filled 2023-07-03: qty 30, 30d supply, fill #1
  Filled 2023-07-31: qty 30, 30d supply, fill #2
  Filled 2023-08-19 – 2023-09-06 (×2): qty 30, 30d supply, fill #3
  Filled 2023-11-05: qty 30, 30d supply, fill #4
  Filled 2024-02-07: qty 30, 30d supply, fill #5

## 2023-02-17 MED ORDER — TRULICITY 0.75 MG/0.5ML ~~LOC~~ SOAJ
0.7500 mg | SUBCUTANEOUS | 1 refills | Status: DC
  Filled 2023-02-17: qty 6, 84d supply, fill #0
  Filled 2023-02-26: qty 2, 28d supply, fill #0
  Filled 2023-03-23 (×2): qty 2, 28d supply, fill #1
  Filled 2023-04-28 (×2): qty 2, 28d supply, fill #2
  Filled 2023-06-03: qty 2, 28d supply, fill #3
  Filled 2023-07-03: qty 2, 28d supply, fill #4
  Filled 2023-07-29 – 2023-07-31 (×2): qty 2, 28d supply, fill #5

## 2023-02-17 MED ORDER — FLUTICASONE PROPIONATE 50 MCG/ACT NA SUSP
2.0000 | Freq: Every day | NASAL | 2 refills | Status: DC
  Filled 2023-02-17 – 2023-04-28 (×2): qty 16, 30d supply, fill #0
  Filled 2023-09-19: qty 16, 30d supply, fill #1
  Filled 2023-11-05: qty 16, 30d supply, fill #2

## 2023-02-17 MED ORDER — HYDROCHLOROTHIAZIDE 25 MG PO TABS
25.0000 mg | ORAL_TABLET | Freq: Every day | ORAL | 1 refills | Status: DC
  Filled 2023-02-17 – 2023-06-03 (×4): qty 90, 90d supply, fill #0
  Filled 2023-07-03 – 2023-07-31 (×2): qty 90, 90d supply, fill #1
  Filled 2023-09-06: qty 30, 30d supply, fill #1

## 2023-02-17 MED ORDER — IBUPROFEN 800 MG PO TABS
800.0000 mg | ORAL_TABLET | Freq: Two times a day (BID) | ORAL | 2 refills | Status: DC | PRN
  Filled 2023-02-17: qty 30, 15d supply, fill #0
  Filled 2023-03-23 (×2): qty 30, 15d supply, fill #1
  Filled 2023-04-28 (×2): qty 30, 15d supply, fill #2

## 2023-02-17 MED ORDER — KETOROLAC TROMETHAMINE 60 MG/2ML IM SOLN
60.0000 mg | Freq: Once | INTRAMUSCULAR | Status: AC
Start: 2023-02-17 — End: 2023-02-17
  Administered 2023-02-17: 60 mg via INTRAMUSCULAR

## 2023-02-17 MED ORDER — METFORMIN HCL 500 MG PO TABS
500.0000 mg | ORAL_TABLET | Freq: Every day | ORAL | 1 refills | Status: DC
Start: 2023-02-17 — End: 2023-10-13
  Filled 2023-02-17: qty 30, 30d supply, fill #0
  Filled 2023-03-23 (×2): qty 30, 30d supply, fill #1
  Filled 2023-04-28: qty 30, 30d supply, fill #2
  Filled 2023-06-03: qty 30, 30d supply, fill #3
  Filled 2023-07-03: qty 30, 30d supply, fill #4
  Filled 2023-07-29 – 2023-09-19 (×2): qty 30, 30d supply, fill #5
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
## 2023-02-17 NOTE — Patient Instructions (Signed)
Esguince de tobillo Ankle Sprain  Un esguince de tobillo es la distensin o el desgarro de un ligamento del tobillo. Los ligamentos son tejidos que [employer].  El esguince de tobillo puede ocurrir cuando: El tobillo gira hacia afuera. Esto se denomina esguince por inversin. El tobillo gira [place]. Esto se denomina esguince por eversin. Cules son las causas? La causa de un esguince de tobillo puede ser la rotacin o la torcedura del tobillo. Qu incrementa el riesgo? Tiene ms probabilidades de tener un esguince de tobillo si practica deportes. Cules son los signos o sntomas?  Dolor en el tobillo. Hinchazn. Moretones. Los moretones pueden aparecer inmediatamente despus del esguince de [place], o 1 o 2 das despus. Dificultad para mantenerse de pie o caminar. Cmo se trata? El esguince de tobillo puede tratarse con lo siguiente: Un dispositivo ortopdico o una frula. Se utiliza para evitar los movimientos del tobillo hasta que se cure. Una venda elstica (vendaje). Se utiliza para sostener el tobillo. Muletas. Analgsicos. Ciruga. Esta puede ser necesaria si el esguince es muy serio. Fisioterapia. Esta puede ayudarlo a Materials engineer tobillo. Siga estas indicaciones en su casa: Si tiene un dispositivo ortopdico o una frula que se puede retirar: Use el dispositivo ortopdico o la frula como se lo haya indicado el mdico. Quteselos solamente como se lo haya indicado el mdico. Controle todos los das la piel alrededor de la frula o el dispositivo ortopdico. Comunquese con su mdico si ve algn problema. Afloje el dispositivo ortopdico o la frula si los dedos de los pies: Hormiguean. Se adormecen. Se tornan fros y de [employer]. Mantenga la frula o el dispositivo ortopdico limpios y secos. Si el dispositivo ortopdico o la frula no son impermeables: No deje que se mojen. Cbralos con un envoltorio hermtico cuando tome un bao de inmersin o  una ducha. Si tiene un vendaje elstico: Slo debe quitarlo para ducharse o baarse. Ajstelo si siente que est demasiado apretado. Afloje el vendaje si el pie: Siente hormigueos. Se le adormece. Se torna fro y de [employer]. Control del dolor, la rigidez y la hinchazn Si se lo indican, aplique hielo en la zona afectada. Si tiene un dispositivo ortopdico o una frula desmontable, quteselo como se lo haya indicado el mdico. Ponga el hielo en una bolsa plstica. Coloque una toalla entre la piel y Copy. Aplique el hielo durante 20 minutos, 2 a 3 veces por da. Si la piel se le pone de color rojo brillante, quite el hielo de inmediato para evitar daos en la piel. El [place] de dao es mayor si no puede sentir dolor, Airline pilot o fro. Mueva los dedos del pie con frecuencia. Cuando est sentado o acostado, levante el tobillo por encima del nivel del corazn. Indicaciones generales Use los medicamentos de venta libre y los recetados solamente como se lo haya indicado el mdico. No fume ni consuma ningn producto que contenga nicotina o tabaco. Si necesita ayuda para dejar de fumar o consumir estos productos, consulte al mdico. Mantenga el tobillo en reposo. Use muletas para soportar el peso del cuerpo. No apoye el peso del cuerpo [employer] pierna lesionada, hasta que el mdico lo autorice. Pregntele al mdico cundo puede volver a conducir si tiene un dispositivo ortopdico o una frula en el tobillo. Comunquese con un mdico si: Los moretones o la hinchazn empeoran de repente. El dolor no mejora despus de tomar medicamentos. Solicite ayuda de inmediato si: El pie o los dedos del pie  se le adormecen o se tornan de color azul. Siente un dolor muy intenso que [place]. Esta informacin no tiene Theme park manager el consejo del mdico. Asegrese de hacerle al mdico cualquier pregunta que tenga. Document Revised: 01/28/2022 Document Reviewed: 01/28/2022 Elsevier Patient Education   2024 [employer].

## 2023-02-20 ENCOUNTER — Encounter (HOSPITAL_BASED_OUTPATIENT_CLINIC_OR_DEPARTMENT_OTHER): Payer: Self-pay

## 2023-02-20 DIAGNOSIS — G4733 Obstructive sleep apnea (adult) (pediatric): Secondary | ICD-10-CM

## 2023-02-26 ENCOUNTER — Other Ambulatory Visit: Payer: Self-pay

## 2023-03-05 ENCOUNTER — Telehealth: Payer: Self-pay

## 2023-03-05 NOTE — Telephone Encounter (Signed)
This is a vague request.  Her job will probably like to know how long she can stand on her feet for.  When she provides that information I will write her letter.

## 2023-03-05 NOTE — Telephone Encounter (Signed)
Routing to PCP for review.

## 2023-03-05 NOTE — Telephone Encounter (Signed)
Patient came in requesting a letter for her job stating that she can not work on her feet for too long due to recent foot injury. Patient is requesting if possible for the letter to be sent to her email on file.

## 2023-03-05 NOTE — Telephone Encounter (Signed)
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

## 2023-03-06 NOTE — Telephone Encounter (Signed)
Letter is ready.

## 2023-03-06 NOTE — Telephone Encounter (Signed)
Pt has been informed.

## 2023-03-09 ENCOUNTER — Ambulatory Visit: Payer: Self-pay | Admitting: Family Medicine

## 2023-03-09 DIAGNOSIS — M7989 Other specified soft tissue disorders: Secondary | ICD-10-CM

## 2023-03-18 ENCOUNTER — Ambulatory Visit
Admission: RE | Admit: 2023-03-18 | Discharge: 2023-03-18 | Disposition: A | Discharge status: Home / Self Care | Payer: PRIVATE HEALTH INSURANCE | Source: Ambulatory Visit | Attending: Family Medicine | Admitting: Family Medicine

## 2023-03-18 ENCOUNTER — Telehealth: Payer: Self-pay

## 2023-03-18 ENCOUNTER — Encounter: Payer: Self-pay | Admitting: Family Medicine

## 2023-03-18 ENCOUNTER — Ambulatory Visit: Payer: Self-pay | Attending: Family Medicine | Admitting: Family Medicine

## 2023-03-18 ENCOUNTER — Other Ambulatory Visit: Payer: Self-pay | Admitting: Family Medicine

## 2023-03-18 ENCOUNTER — Other Ambulatory Visit: Payer: Self-pay

## 2023-03-18 VITALS — BP 143/85 | HR 78 | Ht 62.0 in | Wt 370.0 lb

## 2023-03-18 DIAGNOSIS — E66813 Obesity, class 3: Secondary | ICD-10-CM

## 2023-03-18 DIAGNOSIS — M545 Low back pain, unspecified: Secondary | ICD-10-CM

## 2023-03-18 DIAGNOSIS — I10 Essential (primary) hypertension: Secondary | ICD-10-CM

## 2023-03-18 DIAGNOSIS — F419 Anxiety disorder, unspecified: Secondary | ICD-10-CM

## 2023-03-18 DIAGNOSIS — G4739 Other sleep apnea: Secondary | ICD-10-CM

## 2023-03-18 DIAGNOSIS — F32A Depression, unspecified: Secondary | ICD-10-CM

## 2023-03-18 DIAGNOSIS — J302 Other seasonal allergic rhinitis: Secondary | ICD-10-CM

## 2023-03-18 DIAGNOSIS — G8929 Other chronic pain: Secondary | ICD-10-CM

## 2023-03-18 DIAGNOSIS — M5431 Sciatica, right side: Secondary | ICD-10-CM

## 2023-03-18 DIAGNOSIS — L918 Other hypertrophic disorders of the skin: Secondary | ICD-10-CM

## 2023-03-18 DIAGNOSIS — S93401A Sprain of unspecified ligament of right ankle, initial encounter: Secondary | ICD-10-CM

## 2023-03-18 DIAGNOSIS — R7303 Prediabetes: Secondary | ICD-10-CM

## 2023-03-18 MED ORDER — DICLOFENAC SODIUM 1 % EX GEL
4.0000 g | Freq: Four times a day (QID) | CUTANEOUS | 1 refills | Status: AC
  Filled 2023-03-18: qty 100, 6d supply, fill #0
  Filled 2023-04-28 (×3): qty 100, 6d supply, fill #1

## 2023-03-18 MED ORDER — TIZANIDINE HCL 4 MG PO TABS
4.0000 mg | ORAL_TABLET | Freq: Three times a day (TID) | ORAL | 1 refills | Status: DC | PRN
  Filled 2023-03-18: qty 60, 20d supply, fill #0

## 2023-03-18 NOTE — Patient Instructions (Signed)
Citica Sciatica  La citica es Chief Technology Officer, entumecimiento, debilidad u hormigueo a lo largo del nervio citico. El nervio citico comienza en la parte inferior de la espalda y desciende por la parte posterior de cada pierna. Controla los msculos en la parte inferior de las piernas y en la parte posterior de las rodillas. Tambin proporciona sensibilidad a la parte posterior de los muslos, la parte inferior de las piernas y la planta de los pies. La citica es un sntoma de otra afeccin que ejerce presin o "pellizca" el nervio citico. Con mayor frecuencia, la citica afecta a un solo lado del cuerpo. Suele desaparecer por s sola o con tratamiento. En algunos casos, la Hydrologist (ser recurrente). Cules son las causas? Esta afeccin es causada por una presin sobre el nervio citico o "pellizco" del nervio citico. Esto puede ser el resultado de: Un disco que sobresale demasiado entre los huesos de la columna vertebral (hernia de disco). Cambios en los discos vertebrales relacionados con la edad. Un trastorno doloroso que afecta un msculo de las nalgas. Un crecimiento seo adicional cerca del nervio citico. Una rotura (fractura) de la pelvis. Embarazo. Tumor. Esto es poco frecuente. Qu incrementa el riesgo? Los siguientes factores pueden hacer que sea ms propenso a Aeronautical engineer afeccin: Microbiologist que ponen presin o tensin sobre la columna vertebral. Tener poca fuerza y flexibilidad. Antecedentes de ciruga o lesin en la espalda. Estar sentado durante largos perodos de [place]. Realizar actividades que requieren agacharse o levantar objetos en forma repetida. Obesidad. Cules son los signos o sntomas? Los sntomas pueden variar de leves a muy graves. Pueden incluir los siguientes: Cualquiera de los siguientes problemas en la parte inferior de la espalda, las piernas, la cadera o las nalgas: Hormigueo leve, adormecimiento o dolor sordo. Sensacin de  ardor. Dolor agudo. Adormecimiento de la parte posterior de la pantorrilla o la planta del pie. Debilidad en las piernas. Dolor de espalda intenso que dificulta el movimiento. Los sntomas podran empeorar al toser, Engineering geologist o rerse, o cuando se est sentado o de pie durante perodos prolongados. Cmo se diagnostica? Esta afeccin se puede diagnosticar en funcin de lo siguiente: Los sntomas y los antecedentes mdicos. Un examen fsico. Pruebas de sangre. Pruebas de diagnstico por imgenes, por ejemplo: Radiografas. Una resonancia magntica (RM). Una exploracin por tomografa computarizada (TC). Cmo se trata? En muchos casos, esta afeccin mejora por s sola, sin tratamiento. Sin embargo, el tratamiento puede incluir: Reduccin o modificacin la actividad fsica. Hacer ejercicio, incluido el fortalecimiento y Management consultant. Aplicacin de calor o hielo en la zona afectada. Medicamentos para lo siguiente: Engineer, materials y la inflamacin. Relajan los msculos. Medicamentos inyectables que ayudan a Engineer, materials y la inflamacin (corticoesteroides) alrededor del nervio citico. [place]. Siga estas instrucciones en su casa: Medicamentos Use los medicamentos de venta libre y los recetados solamente como se lo haya indicado el mdico. Pregntele al mdico si el medicamento recetado le impide conducir o usar maquinaria pesada. Control del dolor     Si se lo indican, aplique hielo sobre la zona afectada. Para hacer esto: Ponga el hielo en una bolsa plstica. Coloque una toalla entre la piel y Copy. Aplique el hielo durante 20 minutos, 2 a 3 veces por da. Si la piel se le pone de color rojo brillante, retire el hielo de inmediato para evitar daos en la piel. El riesgo de dao en la piel es mayor si no puede sentir dolor, calor o fro. Si se  lo indican, aplique calor en la zona afectada con la frecuencia que le haya indicado el mdico. Use la fuente de calor que el  mdico le recomiende, como una compresa de calor hmedo o una almohadilla trmica. Coloque una toalla entre la piel y la fuente de Airline pilot. Aplique calor durante 20 a 30 minutos. Si la piel se le pone de color rojo brillante, retire Company secretary de inmediato para evitar quemaduras. El riesgo de quemaduras es mayor si no puede sentir el dolor, el calor o el fro. Actividad  Retome sus actividades normales como se lo haya indicado el mdico. Pregntele al mdico qu actividades son seguras para usted. Evite las [employer] sntomas. Durante el da, descanse durante lapsos breves. Cuando descanse durante perodos ms largos, incorpore alguna [place] o ejercicios de [employer] perodos de descanso. Esto ayudar a Transport planner rigidez y Chief Technology Officer. Evite estar sentado durante largos perodos de tiempo sin moverse. Levntese y [place] al menos una vez cada hora. Haga ejercicio y elongue con regularidad, como se lo haya indicado el mdico. No levante objetos que pesen ms de 10 libras (4.5 kg) hasta que el mdico le diga que es seguro. Aunque no tenga sntomas, evite levantar objetos pesados, en especial en forma repetida. Siempre use las tcnicas de levantamiento correctas para levantar objetos, entre ellas: Flexionar las rodillas. Mantener la carga cerca del cuerpo. No torcerse. Instrucciones generales Mantenga un peso saludable. El exceso de peso ejerce tensin adicional sobre la espalda. Use calzado con buen apoyo y cmodo. Evite usar tacones. Evite dormir sobre un colchn que sea demasiado blando o demasiado duro. Un colchn que ofrezca un apoyo suficientemente firme para su espalda al dormir puede ayudar a Engineer, materials. Comunquese con un mdico si: El dolor no se alivia con los [employer]. El dolor no mejora o [place]. Los sntomas duran ms de 4 semanas. [place] de peso sin causa aparente. Solicite ayuda de inmediato si: No puede controlar cundo Librarian, academic (incontinencia). Tiene lo siguiente: Debilidad que [employer] parte inferior de la espalda, la pelvis, las nalgas o las piernas. Enrojecimiento o inflamacin en la espalda. Sensacin de ardor al [employer]. Resumen La citica es dolor, entumecimiento, debilidad u hormigueo a lo largo del trayecto del nervio citico, que puede incluir la parte inferior de la espalda, las piernas, las caderas y las nalgas. Esta afeccin es causada por una presin sobre el nervio citico o "pellizco" del nervio citico. El tratamiento a menudo incluye reposo, ejercicios, medicamentos y Engineer, manufacturing systems. Esta informacin no tiene Theme park manager el consejo del mdico. Asegrese de hacerle al mdico cualquier pregunta que tenga. Document Revised: 08/06/2021 Document Reviewed: 08/06/2021 Elsevier Patient Education  2024 [employer].

## 2023-03-18 NOTE — Telephone Encounter (Signed)
The patient was in the clinic today and I explained that the [facility] has been trying to arrange an appointment for her to be educated about the use and care of the machine and then take it home. She was agreeable to having me schedule that appointment for her.   I contacted Terri Pinnix and she scheduled the patient for 04/17/2023 and this information was on the AVS when the patient left the clinic today .
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
## 2023-03-23 ENCOUNTER — Other Ambulatory Visit: Payer: Self-pay

## 2023-04-01 ENCOUNTER — Ambulatory Visit (INDEPENDENT_AMBULATORY_CARE_PROVIDER_SITE_OTHER): Payer: Self-pay | Admitting: Vascular Surgery

## 2023-04-06 ENCOUNTER — Other Ambulatory Visit: Payer: Self-pay

## 2023-04-06 ENCOUNTER — Ambulatory Visit (HOSPITAL_COMMUNITY)
Admission: EM | Admit: 2023-04-06 | Discharge: 2023-04-06 | Disposition: A | Discharge status: Home / Self Care | Payer: Self-pay | Attending: Emergency Medicine | Admitting: Emergency Medicine

## 2023-04-06 ENCOUNTER — Encounter (HOSPITAL_COMMUNITY): Payer: Self-pay

## 2023-04-06 DIAGNOSIS — N939 Abnormal uterine and vaginal bleeding, unspecified: Secondary | ICD-10-CM | POA: Insufficient documentation

## 2023-04-06 LAB — CBC WITH DIFFERENTIAL/PLATELET
Abs Immature Granulocytes: 0.04 10*3/uL (ref 0.00–0.07)
Basophils Absolute: 0 10*3/uL (ref 0.0–0.1)
Basophils Relative: 0 %
Eosinophils Absolute: 0.1 10*3/uL (ref 0.0–0.5)
Eosinophils Relative: 1 %
HCT: 37.3 % (ref 36.0–46.0)
Hemoglobin: 12.9 g/dL (ref 12.0–15.0)
Immature Granulocytes: 0 %
Lymphocytes Relative: 15 %
Lymphs Abs: 1.6 10*3/uL (ref 0.7–4.0)
MCH: 30.8 pg (ref 26.0–34.0)
MCHC: 34.6 g/dL (ref 30.0–36.0)
MCV: 89 fL (ref 80.0–100.0)
Monocytes Absolute: 0.6 10*3/uL (ref 0.1–1.0)
Monocytes Relative: 6 %
Neutro Abs: 8 10*3/uL — ABNORMAL HIGH (ref 1.7–7.7)
Neutrophils Relative %: 78 %
Platelets: 292 10*3/uL (ref 150–400)
RBC: 4.19 MIL/uL (ref 3.87–5.11)
RDW: 12.7 % (ref 11.5–15.5)
WBC: 10.4 10*3/uL (ref 4.0–10.5)
nRBC: 0 % (ref 0.0–0.2)

## 2023-04-06 LAB — COMPREHENSIVE METABOLIC PANEL
ALT: 17 U/L (ref 0–44)
AST: 18 U/L (ref 15–41)
Albumin: 3.4 g/dL — ABNORMAL LOW (ref 3.5–5.0)
Alkaline Phosphatase: 56 U/L (ref 38–126)
Anion gap: 4 — ABNORMAL LOW (ref 5–15)
BUN: 13 mg/dL (ref 6–20)
CO2: 26 mmol/L (ref 22–32)
Calcium: 8.5 mg/dL — ABNORMAL LOW (ref 8.9–10.3)
Chloride: 106 mmol/L (ref 98–111)
Creatinine, Ser: 0.8 mg/dL (ref 0.44–1.00)
GFR, Estimated: >60 mL/min (ref >60)
Glucose, Bld: 118 mg/dL — ABNORMAL HIGH (ref 70–99)
Potassium: 4 mmol/L (ref 3.5–5.1)
Sodium: 136 mmol/L (ref 135–145)
Total Bilirubin: 0.2 mg/dL (ref <1.2)
Total Protein: 6.4 g/dL — ABNORMAL LOW (ref 6.5–8.1)

## 2023-04-06 LAB — TSH: TSH: 2.119 u[IU]/mL (ref 0.350–4.500)

## 2023-04-06 LAB — POCT URINE PREGNANCY: Preg Test, Ur: NEGATIVE

## 2023-04-06 MED ORDER — MEGESTROL ACETATE 40 MG PO TABS
40.0000 mg | ORAL_TABLET | Freq: Every day | ORAL | 0 refills | Status: DC
  Filled 2023-04-06: qty 30, 30d supply, fill #0

## 2023-04-06 NOTE — ED Notes (Signed)
This RN made Lurena Joiner PA aware of pt's symptoms reported in triage -- comfortable with pt to go back to lobby and provide [place] a urine sample in the meantime.

## 2023-04-06 NOTE — Discharge Instructions (Signed)
Your blood counts were normal with no significant anemia.  Start Megace daily.  This should stop your bleeding but you will have withdrawal bleeding as soon as the medication is finished in 30 days.  I have put in a referral for you to see gynecology but I also recommend you call them to see if you can schedule an appointment.  I will contact you if any of your other blood work is abnormal.  If you continue to have bleeding despite this medication, continue bleeding heavily, develop shortness of breath, chest pain, lightheadedness, weakness you need to go to the emergency room immediately.

## 2023-04-06 NOTE — ED Triage Notes (Signed)
Pt presents with vaginal bleeding that began on 12/17. Pt states heavy bleeding with blood clots began last night. Pt reports "every 30 minutes I soak through a pad." Pt currently rates her pain a 10/10. Ibuprofen and Excedrin taken this AM with little improvement in pain. Pt denies taking pregnancy test recently.
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
## 2023-04-17 ENCOUNTER — Ambulatory Visit (HOSPITAL_BASED_OUTPATIENT_CLINIC_OR_DEPARTMENT_OTHER): Payer: Self-pay | Attending: Family Medicine

## 2023-04-17 DIAGNOSIS — G4733 Obstructive sleep apnea (adult) (pediatric): Secondary | ICD-10-CM

## 2023-04-20 NOTE — Telephone Encounter (Signed)
 Patient received her CPAP machine on 04/17/2023 after completing CPAP training at [facility]
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
## 2023-04-22 ENCOUNTER — Other Ambulatory Visit: Payer: Self-pay

## 2023-04-22 ENCOUNTER — Ambulatory Visit: Payer: Self-pay | Attending: Family Medicine

## 2023-04-22 DIAGNOSIS — M25571 Pain in right ankle and joints of right foot: Secondary | ICD-10-CM | POA: Insufficient documentation

## 2023-04-22 DIAGNOSIS — G8929 Other chronic pain: Secondary | ICD-10-CM | POA: Insufficient documentation

## 2023-04-22 DIAGNOSIS — R293 Abnormal posture: Secondary | ICD-10-CM | POA: Insufficient documentation

## 2023-04-22 DIAGNOSIS — M6281 Muscle weakness (generalized): Secondary | ICD-10-CM | POA: Insufficient documentation

## 2023-04-22 DIAGNOSIS — R262 Difficulty in walking, not elsewhere classified: Secondary | ICD-10-CM | POA: Insufficient documentation

## 2023-04-22 DIAGNOSIS — M5459 Other low back pain: Secondary | ICD-10-CM | POA: Insufficient documentation

## 2023-04-22 DIAGNOSIS — M5431 Sciatica, right side: Secondary | ICD-10-CM | POA: Insufficient documentation
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
## 2023-04-28 ENCOUNTER — Ambulatory Visit: Payer: Self-pay | Admitting: Physical Therapy

## 2023-04-28 ENCOUNTER — Encounter: Payer: Self-pay | Admitting: Physical Therapy

## 2023-04-28 ENCOUNTER — Other Ambulatory Visit: Payer: Self-pay

## 2023-04-28 DIAGNOSIS — R262 Difficulty in walking, not elsewhere classified: Secondary | ICD-10-CM

## 2023-04-28 DIAGNOSIS — M5459 Other low back pain: Secondary | ICD-10-CM

## 2023-04-28 DIAGNOSIS — R293 Abnormal posture: Secondary | ICD-10-CM

## 2023-04-28 DIAGNOSIS — M6281 Muscle weakness (generalized): Secondary | ICD-10-CM

## 2023-04-28 DIAGNOSIS — G8929 Other chronic pain: Secondary | ICD-10-CM

## 2023-04-30 ENCOUNTER — Ambulatory Visit: Payer: Self-pay | Admitting: Physical Therapy
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
## 2023-05-05 ENCOUNTER — Ambulatory Visit: Payer: Self-pay | Admitting: Physical Therapy

## 2023-05-05 DIAGNOSIS — M5459 Other low back pain: Secondary | ICD-10-CM

## 2023-05-05 DIAGNOSIS — R262 Difficulty in walking, not elsewhere classified: Secondary | ICD-10-CM

## 2023-05-05 DIAGNOSIS — M6281 Muscle weakness (generalized): Secondary | ICD-10-CM

## 2023-05-05 DIAGNOSIS — G8929 Other chronic pain: Secondary | ICD-10-CM

## 2023-05-06 ENCOUNTER — Ambulatory Visit: Payer: Self-pay | Attending: Nurse Practitioner | Admitting: Nurse Practitioner

## 2023-05-06 ENCOUNTER — Other Ambulatory Visit: Payer: Self-pay

## 2023-05-06 ENCOUNTER — Encounter: Payer: Self-pay | Admitting: Nurse Practitioner

## 2023-05-06 VITALS — BP 107/73 | HR 93 | Resp 20 | Ht 62.0 in | Wt 370.2 lb

## 2023-05-06 DIAGNOSIS — J312 Chronic pharyngitis: Secondary | ICD-10-CM

## 2023-05-06 MED ORDER — OMEPRAZOLE 20 MG PO CPDR
20.0000 mg | DELAYED_RELEASE_CAPSULE | Freq: Every day | ORAL | 3 refills | Status: DC
  Filled 2023-05-06: qty 30, 30d supply, fill #0
  Filled 2023-06-03: qty 30, 30d supply, fill #1
  Filled 2023-07-03: qty 30, 30d supply, fill #2
  Filled 2023-08-19: qty 30, 30d supply, fill #3
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
## 2023-05-07 ENCOUNTER — Encounter: Payer: Self-pay | Admitting: Nurse Practitioner

## 2023-05-07 ENCOUNTER — Ambulatory Visit: Payer: Self-pay

## 2023-05-08 ENCOUNTER — Other Ambulatory Visit: Payer: Self-pay

## 2023-05-11 ENCOUNTER — Other Ambulatory Visit: Payer: Self-pay

## 2023-05-11 ENCOUNTER — Encounter: Payer: Self-pay | Admitting: Obstetrics and Gynecology

## 2023-05-11 ENCOUNTER — Ambulatory Visit: Payer: Self-pay | Admitting: Obstetrics and Gynecology

## 2023-05-11 VITALS — BP 101/67 | HR 107 | Wt 363.4 lb

## 2023-05-11 DIAGNOSIS — N939 Abnormal uterine and vaginal bleeding, unspecified: Secondary | ICD-10-CM

## 2023-05-11 DIAGNOSIS — R102 Pelvic and perineal pain: Secondary | ICD-10-CM

## 2023-05-11 DIAGNOSIS — Z603 Acculturation difficulty: Secondary | ICD-10-CM

## 2023-05-11 DIAGNOSIS — E282 Polycystic ovarian syndrome: Secondary | ICD-10-CM

## 2023-05-11 DIAGNOSIS — K5904 Chronic idiopathic constipation: Secondary | ICD-10-CM

## 2023-05-11 DIAGNOSIS — Z1331 Encounter for screening for depression: Secondary | ICD-10-CM

## 2023-05-11 DIAGNOSIS — Z758 Other problems related to medical facilities and other health care: Secondary | ICD-10-CM

## 2023-05-11 DIAGNOSIS — G8929 Other chronic pain: Secondary | ICD-10-CM

## 2023-05-11 MED ORDER — SLYND 4 MG PO TABS
1.0000 | ORAL_TABLET | Freq: Every day | ORAL | 6 refills | Status: DC
  Filled 2023-05-11 – 2023-05-25 (×2): qty 28, 28d supply, fill #0
  Filled 2023-06-21: qty 28, 28d supply, fill #1
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
## 2023-05-14 ENCOUNTER — Ambulatory Visit: Payer: Self-pay

## 2023-05-15 ENCOUNTER — Ambulatory Visit (HOSPITAL_COMMUNITY): Admission: RE | Admit: 2023-05-15 | Payer: Self-pay | Source: Ambulatory Visit

## 2023-05-19 ENCOUNTER — Other Ambulatory Visit (HOSPITAL_COMMUNITY): Payer: Self-pay

## 2023-05-20 ENCOUNTER — Other Ambulatory Visit: Payer: Self-pay

## 2023-05-22 ENCOUNTER — Ambulatory Visit (HOSPITAL_COMMUNITY)
Admission: RE | Admit: 2023-05-22 | Discharge: 2023-05-22 | Disposition: A | Discharge status: Home / Self Care | Payer: Self-pay | Source: Ambulatory Visit | Attending: Obstetrics and Gynecology

## 2023-05-22 ENCOUNTER — Other Ambulatory Visit: Payer: Self-pay

## 2023-05-22 DIAGNOSIS — N939 Abnormal uterine and vaginal bleeding, unspecified: Secondary | ICD-10-CM | POA: Insufficient documentation

## 2023-05-25 ENCOUNTER — Other Ambulatory Visit (HOSPITAL_COMMUNITY): Payer: Self-pay

## 2023-05-25 ENCOUNTER — Encounter: Payer: Self-pay | Admitting: Obstetrics and Gynecology

## 2023-05-25 ENCOUNTER — Other Ambulatory Visit: Payer: Self-pay

## 2023-05-28 ENCOUNTER — Telehealth: Payer: Self-pay | Admitting: General Practice

## 2023-05-28 NOTE — Telephone Encounter (Signed)
Called patient with Stacie Mitchell assisting with spanish interpretation and asked patient if she had any questions after reading Dr Odie Sera message. Patient states she has picked up the medicine and will follow up for the biopsy next week at her appt.

## 2023-05-28 NOTE — Telephone Encounter (Signed)
-----   Message from [place] sent at 05/25/2023  2:46 PM EST ----- Review that ultrasound shows possible adenomyosis so will see how she responds ot slynd and recommend completing endometrial biopsy at follow up visit.

## 2023-06-02 ENCOUNTER — Telehealth: Payer: Self-pay | Admitting: Family Medicine

## 2023-06-03 ENCOUNTER — Ambulatory Visit: Payer: Self-pay | Admitting: Obstetrics and Gynecology

## 2023-06-03 ENCOUNTER — Other Ambulatory Visit: Payer: Self-pay | Admitting: Physician Assistant

## 2023-06-03 ENCOUNTER — Other Ambulatory Visit: Payer: Self-pay

## 2023-06-03 ENCOUNTER — Ambulatory Visit (INDEPENDENT_AMBULATORY_CARE_PROVIDER_SITE_OTHER): Payer: Self-pay | Admitting: Obstetrics and Gynecology

## 2023-06-03 ENCOUNTER — Other Ambulatory Visit (HOSPITAL_COMMUNITY)
Admission: RE | Admit: 2023-06-03 | Discharge: 2023-06-03 | Disposition: A | Discharge status: Home / Self Care | Payer: Self-pay | Source: Ambulatory Visit | Attending: Obstetrics and Gynecology | Admitting: Obstetrics and Gynecology

## 2023-06-03 ENCOUNTER — Other Ambulatory Visit: Payer: Self-pay | Admitting: Family Medicine

## 2023-06-03 ENCOUNTER — Encounter: Payer: Self-pay | Admitting: Obstetrics and Gynecology

## 2023-06-03 VITALS — BP 140/70 | HR 82 | Wt 370.0 lb

## 2023-06-03 DIAGNOSIS — Z124 Encounter for screening for malignant neoplasm of cervix: Secondary | ICD-10-CM | POA: Insufficient documentation

## 2023-06-03 DIAGNOSIS — G8929 Other chronic pain: Secondary | ICD-10-CM

## 2023-06-03 DIAGNOSIS — Z758 Other problems related to medical facilities and other health care: Secondary | ICD-10-CM

## 2023-06-03 DIAGNOSIS — N941 Unspecified dyspareunia: Secondary | ICD-10-CM

## 2023-06-03 DIAGNOSIS — Z603 Acculturation difficulty: Secondary | ICD-10-CM

## 2023-06-03 DIAGNOSIS — R102 Pelvic and perineal pain: Secondary | ICD-10-CM

## 2023-06-03 DIAGNOSIS — N939 Abnormal uterine and vaginal bleeding, unspecified: Secondary | ICD-10-CM

## 2023-06-03 MED ORDER — CYCLOBENZAPRINE HCL 10 MG PO TABS
10.0000 mg | ORAL_TABLET | Freq: Two times a day (BID) | ORAL | 1 refills | Status: DC | PRN
  Filled 2023-06-03: qty 30, 15d supply, fill #0
  Filled 2023-07-13: qty 30, 15d supply, fill #1

## 2023-06-03 MED ORDER — VITAMIN D (ERGOCALCIFEROL) 1.25 MG (50000 UNIT) PO CAPS
50000.0000 [IU] | ORAL_CAPSULE | ORAL | 0 refills | Status: AC
  Filled 2023-06-03: qty 12, 84d supply, fill #0

## 2023-06-03 MED ORDER — IBUPROFEN 800 MG PO TABS
800.0000 mg | ORAL_TABLET | Freq: Two times a day (BID) | ORAL | 2 refills | Status: DC | PRN
  Filled 2023-06-03: qty 30, 15d supply, fill #0
  Filled 2023-06-21 – 2023-06-22 (×2): qty 30, 15d supply, fill #1
  Filled 2023-07-03: qty 30, 15d supply, fill #2

## 2023-06-03 MED ORDER — IBUPROFEN 800 MG PO TABS
800.0000 mg | ORAL_TABLET | Freq: Once | ORAL | Status: AC
  Administered 2023-06-03: 800 mg via ORAL

## 2023-06-03 NOTE — Progress Notes (Signed)
 Marland Kitchen
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
## 2023-06-04 ENCOUNTER — Other Ambulatory Visit: Payer: Self-pay

## 2023-06-04 ENCOUNTER — Encounter: Payer: Self-pay | Admitting: General Practice

## 2023-06-05 ENCOUNTER — Encounter: Payer: Self-pay | Admitting: Obstetrics and Gynecology

## 2023-06-05 LAB — SURGICAL PATHOLOGY

## 2023-06-05 LAB — CYTOLOGY - PAP
Comment: NEGATIVE
Diagnosis: NEGATIVE
High risk HPV: NEGATIVE

## 2023-06-09 ENCOUNTER — Other Ambulatory Visit: Payer: Self-pay

## 2023-06-10 ENCOUNTER — Telehealth: Payer: Self-pay | Admitting: Family Medicine

## 2023-06-10 NOTE — Telephone Encounter (Signed)
 Called patient with in house Spanish interpreter Eda regarding results. Notified patient pap smear was normal and recommends another pap smear in 5 years and endometrial biopsy was benign. Per Dr. Briscoe Deutscher, advise patient to continue with slynd to help with bleeding. Patient verbalized understanding.   Marcelino Duster, RN

## 2023-06-10 NOTE — Telephone Encounter (Signed)
 Please call patient with colpo results

## 2023-06-16 ENCOUNTER — Telehealth: Payer: Self-pay | Admitting: Family Medicine

## 2023-06-16 NOTE — Telephone Encounter (Signed)
 Patient is requesting that a nurse gives her a call back. She says the medicine that was prescribed at her last visit (flexeril) does not seem to be working. She has not been able to sleep well, is experiencing back pain and has been bleeding for 11 days straight although this medicine was supposed to help control her bleeding. Call back number was confirmed.

## 2023-06-19 ENCOUNTER — Institutional Professional Consult (permissible substitution) (INDEPENDENT_AMBULATORY_CARE_PROVIDER_SITE_OTHER): Payer: Self-pay | Admitting: Otolaryngology

## 2023-06-19 NOTE — Telephone Encounter (Signed)
 Called pt with interpreter Tobi Bastos. Reports bleeding began after EMB on 06/03/23, increased to bleeding like a regular menstrual period a few days later. This has continued for 2 weeks. Bleeding every day, some days heavier and some lighter. Pads are fully saturated with blood clots; larger than quarter size. Wears 2 pads together and changes 3 times a day and sometimes at night. Notices blood has bad odor, but cannot describe. Frustrated because she began [place] after it was prescribed 05/11/23 to help with bleeding. This is first bleeding episode since starting. She feels tired, has abdominal pain, bilateral leg cramps, and dizziness (only first thing in the morning). Taking ibuprofen for abdominal pain and leg cramps. Will forward to provider for review.

## 2023-06-22 ENCOUNTER — Other Ambulatory Visit: Payer: Self-pay

## 2023-06-22 ENCOUNTER — Other Ambulatory Visit: Payer: Self-pay | Admitting: Obstetrics and Gynecology

## 2023-06-22 DIAGNOSIS — N939 Abnormal uterine and vaginal bleeding, unspecified: Secondary | ICD-10-CM

## 2023-06-22 DIAGNOSIS — E282 Polycystic ovarian syndrome: Secondary | ICD-10-CM

## 2023-06-22 MED ORDER — ISIBLOOM 0.15-30 MG-MCG PO TABS
1.0000 | ORAL_TABLET | Freq: Every day | ORAL | 11 refills | Status: DC
  Filled 2023-06-22: qty 28, 28d supply, fill #0

## 2023-06-22 NOTE — Telephone Encounter (Addendum)
 Lorriane Shire, MD to Me  06/22/23 11:38 AM If still having heavy bleeding, likely will need to switch to combination pill - will send to pharmacy. Note that it may not stop bleeding immediately but will hopefully see improvement. Can finish out the slynd pack or switch immediately to new pill.   Called pt to review with interpreter Gershon Cull. Patient states she just picked up new pack of Slynd. Explained provider is okay with her discontinuing now or finishing current pack. Plans to pick up tomorrow morning. Pt would like to see provider for appointment. Front office notified to contact patient to schedule appointment.

## 2023-06-23 ENCOUNTER — Other Ambulatory Visit: Payer: Self-pay

## 2023-06-25 ENCOUNTER — Other Ambulatory Visit: Payer: Self-pay

## 2023-06-25 ENCOUNTER — Ambulatory Visit: Payer: Self-pay | Admitting: Obstetrics and Gynecology

## 2023-06-25 ENCOUNTER — Encounter: Payer: Self-pay | Admitting: Obstetrics and Gynecology

## 2023-06-25 VITALS — BP 137/86 | HR 86 | Wt 371.3 lb

## 2023-06-25 DIAGNOSIS — Z1331 Encounter for screening for depression: Secondary | ICD-10-CM

## 2023-06-25 DIAGNOSIS — N719 Inflammatory disease of uterus, unspecified: Secondary | ICD-10-CM

## 2023-06-25 DIAGNOSIS — N939 Abnormal uterine and vaginal bleeding, unspecified: Secondary | ICD-10-CM

## 2023-06-25 MED ORDER — NORETHINDRONE ACETATE 5 MG PO TABS
10.0000 mg | ORAL_TABLET | Freq: Every day | ORAL | 2 refills | Status: DC
  Filled 2023-06-25 – 2023-08-03 (×3): qty 60, 30d supply, fill #0

## 2023-06-25 MED ORDER — DOXYCYCLINE HYCLATE 100 MG PO TABS
100.0000 mg | ORAL_TABLET | Freq: Two times a day (BID) | ORAL | 0 refills | Status: AC
  Filled 2023-06-25: qty 28, 14d supply, fill #0

## 2023-06-25 MED ORDER — METRONIDAZOLE 500 MG PO TABS
500.0000 mg | ORAL_TABLET | Freq: Two times a day (BID) | ORAL | 0 refills | Status: AC
  Filled 2023-06-25: qty 28, 14d supply, fill #0
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
## 2023-07-01 ENCOUNTER — Ambulatory Visit: Payer: Self-pay | Admitting: Vascular Surgery

## 2023-07-06 ENCOUNTER — Other Ambulatory Visit: Payer: Self-pay

## 2023-07-08 ENCOUNTER — Other Ambulatory Visit: Payer: Self-pay

## 2023-07-13 ENCOUNTER — Other Ambulatory Visit: Payer: Self-pay

## 2023-07-13 ENCOUNTER — Other Ambulatory Visit (HOSPITAL_COMMUNITY): Payer: Self-pay

## 2023-07-14 ENCOUNTER — Other Ambulatory Visit: Payer: Self-pay

## 2023-07-23 ENCOUNTER — Ambulatory Visit: Payer: Self-pay | Admitting: Obstetrics and Gynecology

## 2023-07-24 ENCOUNTER — Ambulatory Visit: Payer: Self-pay | Admitting: Physical Therapy

## 2023-07-28 ENCOUNTER — Institutional Professional Consult (permissible substitution) (INDEPENDENT_AMBULATORY_CARE_PROVIDER_SITE_OTHER): Payer: Self-pay
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
## 2023-07-29 ENCOUNTER — Other Ambulatory Visit: Payer: Self-pay

## 2023-07-29 ENCOUNTER — Other Ambulatory Visit: Payer: Self-pay | Admitting: Family Medicine

## 2023-07-29 DIAGNOSIS — G8929 Other chronic pain: Secondary | ICD-10-CM

## 2023-07-29 MED ORDER — IBUPROFEN 800 MG PO TABS
800.0000 mg | ORAL_TABLET | Freq: Two times a day (BID) | ORAL | 2 refills | Status: DC | PRN
  Filled 2023-07-29: qty 30, 15d supply, fill #0
  Filled 2023-08-19: qty 30, 15d supply, fill #1
  Filled 2023-09-06: qty 30, 15d supply, fill #2

## 2023-07-30 ENCOUNTER — Other Ambulatory Visit: Payer: Self-pay

## 2023-07-31 ENCOUNTER — Other Ambulatory Visit: Payer: Self-pay

## 2023-08-03 ENCOUNTER — Other Ambulatory Visit: Payer: Self-pay

## 2023-08-11 ENCOUNTER — Ambulatory Visit (INDEPENDENT_AMBULATORY_CARE_PROVIDER_SITE_OTHER): Payer: Self-pay | Admitting: Dermatology

## 2023-08-11 ENCOUNTER — Ambulatory Visit: Payer: Self-pay | Admitting: Dermatology

## 2023-08-11 ENCOUNTER — Encounter: Payer: Self-pay | Admitting: Dermatology

## 2023-08-11 VITALS — BP 130/85 | HR 100

## 2023-08-11 DIAGNOSIS — L918 Other hypertrophic disorders of the skin: Secondary | ICD-10-CM

## 2023-08-11 NOTE — Patient Instructions (Signed)

## 2023-08-11 NOTE — Progress Notes (Unsigned)
   New Patient Visit   Subjective  Stacie Mitchell is a 38 y.o. female who presents for the following: growths  Pt has various growths under her breasts, bilateral axilla and around her neck that get bothersome. She'd like to discuss treatment. Previously had some removed at another office. Lesions are not painful but itchy and frustrating. Spanish interpreter present.  The following portions of the chart were reviewed this encounter and updated as appropriate: medications, allergies, medical history  Review of Systems:  No other skin or systemic complaints except as noted in HPI or Assessment and Plan.  Objective  Well appearing patient in no apparent distress; mood and affect are within normal limits.   A focused examination was performed of the following areas: Neck, axilla and under breasts  Relevant exam findings are noted in the Assessment and Plan.    Assessment & Plan   Acrochordons (Skin Tags) - Fleshy, skin-colored pedunculated papules - Benign appearing.  - Observe. - If desired, they can be removed with an in office procedure that is not covered by insurance. - Please call the clinic if you notice any new or changing lesions.   Pt was told we can remove them cosmetically and that it is $200 for 20 lesions; patient will think about and consider retuning for cosmetic removal  No follow-ups on file.  I, Wilson Hasten, CMA, am acting as scribe for Deneise Finlay, MD.   Documentation: I have reviewed the above documentation for accuracy and completeness, and I agree with the above.  Deneise Finlay, MD

## 2023-08-20 ENCOUNTER — Other Ambulatory Visit: Payer: Self-pay

## 2023-08-24 ENCOUNTER — Other Ambulatory Visit: Payer: Self-pay

## 2023-08-26 ENCOUNTER — Ambulatory Visit: Payer: Self-pay | Admitting: Obstetrics and Gynecology

## 2023-08-26 ENCOUNTER — Other Ambulatory Visit: Payer: Self-pay

## 2023-08-26 VITALS — BP 142/91 | HR 91 | Wt 382.0 lb

## 2023-08-26 DIAGNOSIS — R102 Pelvic and perineal pain: Secondary | ICD-10-CM

## 2023-08-26 DIAGNOSIS — G8929 Other chronic pain: Secondary | ICD-10-CM

## 2023-08-26 DIAGNOSIS — N939 Abnormal uterine and vaginal bleeding, unspecified: Secondary | ICD-10-CM

## 2023-08-26 DIAGNOSIS — E66813 Obesity, class 3: Secondary | ICD-10-CM

## 2023-08-26 DIAGNOSIS — N941 Unspecified dyspareunia: Secondary | ICD-10-CM

## 2023-08-26 DIAGNOSIS — Z6841 Body Mass Index (BMI) 40.0 and over, adult: Secondary | ICD-10-CM

## 2023-08-26 DIAGNOSIS — R7303 Prediabetes: Secondary | ICD-10-CM

## 2023-08-26 DIAGNOSIS — Z1331 Encounter for screening for depression: Secondary | ICD-10-CM

## 2023-08-26 MED ORDER — NORETHINDRONE ACETATE 5 MG PO TABS
10.0000 mg | ORAL_TABLET | Freq: Every day | ORAL | 11 refills | Status: AC
  Filled 2023-08-26 – 2023-09-06 (×2): qty 60, 30d supply, fill #0

## 2023-08-26 MED ORDER — CYCLOBENZAPRINE HCL 10 MG PO TABS
10.0000 mg | ORAL_TABLET | Freq: Two times a day (BID) | ORAL | 1 refills | Status: DC | PRN
  Filled 2023-08-26: qty 30, 15d supply, fill #0
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
## 2023-08-27 ENCOUNTER — Encounter (INDEPENDENT_AMBULATORY_CARE_PROVIDER_SITE_OTHER): Payer: Self-pay

## 2023-09-04 ENCOUNTER — Other Ambulatory Visit: Payer: Self-pay

## 2023-09-06 ENCOUNTER — Other Ambulatory Visit: Payer: Self-pay

## 2023-09-06 ENCOUNTER — Other Ambulatory Visit: Payer: Self-pay | Admitting: Family Medicine

## 2023-09-06 ENCOUNTER — Other Ambulatory Visit: Payer: Self-pay | Admitting: Nurse Practitioner

## 2023-09-06 DIAGNOSIS — J312 Chronic pharyngitis: Secondary | ICD-10-CM

## 2023-09-06 DIAGNOSIS — R7303 Prediabetes: Secondary | ICD-10-CM

## 2023-09-08 ENCOUNTER — Other Ambulatory Visit: Payer: Self-pay

## 2023-09-08 MED ORDER — OMEPRAZOLE 20 MG PO CPDR
20.0000 mg | DELAYED_RELEASE_CAPSULE | Freq: Every day | ORAL | 3 refills | Status: AC
  Filled 2023-09-08 – 2023-09-19 (×2): qty 30, 30d supply, fill #0
  Filled 2023-11-05: qty 30, 30d supply, fill #1

## 2023-09-08 MED ORDER — TRULICITY 0.75 MG/0.5ML ~~LOC~~ SOAJ
0.7500 mg | SUBCUTANEOUS | 1 refills | Status: DC
  Filled 2023-09-08: qty 2, 28d supply, fill #0
  Filled 2023-10-07: qty 2, 28d supply, fill #1

## 2023-09-09 ENCOUNTER — Other Ambulatory Visit: Payer: Self-pay

## 2023-09-14 ENCOUNTER — Other Ambulatory Visit: Payer: Self-pay

## 2023-09-16 ENCOUNTER — Ambulatory Visit
Admission: EM | Admit: 2023-09-16 | Discharge: 2023-09-16 | Disposition: A | Discharge status: Home / Self Care | Attending: Family Medicine | Admitting: Family Medicine

## 2023-09-16 ENCOUNTER — Other Ambulatory Visit: Payer: Self-pay

## 2023-09-16 ENCOUNTER — Ambulatory Visit: Payer: Self-pay | Admitting: Family Medicine

## 2023-09-16 DIAGNOSIS — M545 Low back pain, unspecified: Secondary | ICD-10-CM

## 2023-09-16 DIAGNOSIS — N898 Other specified noninflammatory disorders of vagina: Secondary | ICD-10-CM

## 2023-09-16 DIAGNOSIS — R300 Dysuria: Secondary | ICD-10-CM

## 2023-09-16 LAB — POCT URINALYSIS DIP (MANUAL ENTRY)
Bilirubin, UA: NEGATIVE
Glucose, UA: NEGATIVE mg/dL
Ketones, POC UA: NEGATIVE mg/dL
Leukocytes, UA: NEGATIVE
Nitrite, UA: NEGATIVE
Protein Ur, POC: NEGATIVE mg/dL
Spec Grav, UA: 1.025 (ref 1.010–1.025)
Urobilinogen, UA: 0.2 U/dL
pH, UA: 5.5 (ref 5.0–8.0)

## 2023-09-16 LAB — POCT URINE PREGNANCY: Preg Test, Ur: NEGATIVE

## 2023-09-16 MED ORDER — CEPHALEXIN 500 MG PO CAPS
500.0000 mg | ORAL_CAPSULE | Freq: Two times a day (BID) | ORAL | 0 refills | Status: AC
  Filled 2023-09-16: qty 14, 7d supply, fill #0

## 2023-09-16 MED ORDER — KETOROLAC TROMETHAMINE 30 MG/ML IJ SOLN
30.0000 mg | Freq: Once | INTRAMUSCULAR | Status: AC
  Administered 2023-09-16: 30 mg via INTRAMUSCULAR
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
## 2023-09-16 NOTE — ED Triage Notes (Signed)
 Pt states lower back pain and pelvic pain for the past 4 days. States now it burns when she urinates.

## 2023-09-16 NOTE — Discharge Instructions (Signed)
 You were given a Toradol  injection in clinic today. Do not take any over the counter NSAID's such as Advil , ibuprofen , Aleve , or naproxen  for 24 hours. You may take tylenol  if needed Start Keflex  twice daily for 7 days for your urinary symptoms.  Lots of rest and fluids.  Please follow-up with your PCP in 2 days for recheck.  Please go to the ER for any worsening symptoms.  Hope you feel better soon!

## 2023-09-17 LAB — URINE CULTURE

## 2023-09-18 LAB — CERVICOVAGINAL ANCILLARY ONLY
Bacterial Vaginitis (gardnerella): NEGATIVE
Candida Glabrata: NEGATIVE
Candida Vaginitis: NEGATIVE
Chlamydia: NEGATIVE
Comment: NEGATIVE
Comment: NEGATIVE
Comment: NEGATIVE
Comment: NEGATIVE
Comment: NEGATIVE
Comment: NORMAL
Neisseria Gonorrhea: NEGATIVE
Trichomonas: NEGATIVE

## 2023-09-21 ENCOUNTER — Other Ambulatory Visit: Payer: Self-pay

## 2023-09-21 ENCOUNTER — Ambulatory Visit (HOSPITAL_COMMUNITY): Payer: Self-pay

## 2023-09-24 ENCOUNTER — Other Ambulatory Visit: Payer: Self-pay

## 2023-09-28 ENCOUNTER — Other Ambulatory Visit: Payer: Self-pay

## 2023-10-06 ENCOUNTER — Other Ambulatory Visit: Payer: Self-pay

## 2023-10-06 ENCOUNTER — Encounter (HOSPITAL_BASED_OUTPATIENT_CLINIC_OR_DEPARTMENT_OTHER): Payer: Self-pay

## 2023-10-06 ENCOUNTER — Emergency Department (HOSPITAL_BASED_OUTPATIENT_CLINIC_OR_DEPARTMENT_OTHER)

## 2023-10-06 ENCOUNTER — Emergency Department (HOSPITAL_BASED_OUTPATIENT_CLINIC_OR_DEPARTMENT_OTHER)
Admission: EM | Admit: 2023-10-06 | Discharge: 2023-10-06 | Disposition: A | Discharge status: Home / Self Care | Attending: Emergency Medicine | Admitting: Emergency Medicine

## 2023-10-06 ENCOUNTER — Ambulatory Visit
Admission: RE | Admit: 2023-10-06 | Discharge: 2023-10-06 | Disposition: A | Discharge status: Other Acute Inpatient Hospital | Source: Ambulatory Visit | Attending: Family Medicine | Admitting: Family Medicine

## 2023-10-06 VITALS — HR 100 | Temp 99.3°F | Resp 17

## 2023-10-06 DIAGNOSIS — Z7984 Long term (current) use of oral hypoglycemic drugs: Secondary | ICD-10-CM | POA: Insufficient documentation

## 2023-10-06 DIAGNOSIS — N760 Acute vaginitis: Secondary | ICD-10-CM

## 2023-10-06 DIAGNOSIS — R300 Dysuria: Secondary | ICD-10-CM

## 2023-10-06 DIAGNOSIS — E119 Type 2 diabetes mellitus without complications: Secondary | ICD-10-CM | POA: Insufficient documentation

## 2023-10-06 DIAGNOSIS — R1030 Lower abdominal pain, unspecified: Secondary | ICD-10-CM

## 2023-10-06 DIAGNOSIS — N898 Other specified noninflammatory disorders of vagina: Secondary | ICD-10-CM | POA: Insufficient documentation

## 2023-10-06 DIAGNOSIS — R109 Unspecified abdominal pain: Secondary | ICD-10-CM | POA: Insufficient documentation

## 2023-10-06 LAB — COMPREHENSIVE METABOLIC PANEL WITH GFR
ALT: 22 U/L (ref 0–44)
AST: 24 U/L (ref 15–41)
Albumin: 4 g/dL (ref 3.5–5.0)
Alkaline Phosphatase: 74 U/L (ref 38–126)
Anion gap: 12 (ref 5–15)
BUN: 9 mg/dL (ref 6–20)
CO2: 24 mmol/L (ref 22–32)
Calcium: 9 mg/dL (ref 8.9–10.3)
Chloride: 102 mmol/L (ref 98–111)
Creatinine, Ser: 0.62 mg/dL (ref 0.44–1.00)
GFR, Estimated: >60 mL/min (ref >60)
Glucose, Bld: 87 mg/dL (ref 70–99)
Potassium: 3.6 mmol/L (ref 3.5–5.1)
Sodium: 138 mmol/L (ref 135–145)
Total Bilirubin: <0.2 mg/dL (ref 0.0–1.2)
Total Protein: 6.7 g/dL (ref 6.5–8.1)

## 2023-10-06 LAB — CBC WITH DIFFERENTIAL/PLATELET
Abs Immature Granulocytes: 0.04 10*3/uL (ref 0.00–0.07)
Basophils Absolute: 0 10*3/uL (ref 0.0–0.1)
Basophils Relative: 0 %
Eosinophils Absolute: 0.1 10*3/uL (ref 0.0–0.5)
Eosinophils Relative: 1 %
HCT: 33.6 % — ABNORMAL LOW (ref 36.0–46.0)
Hemoglobin: 10.9 g/dL — ABNORMAL LOW (ref 12.0–15.0)
Immature Granulocytes: 0 %
Lymphocytes Relative: 24 %
Lymphs Abs: 2.1 10*3/uL (ref 0.7–4.0)
MCH: 26.6 pg (ref 26.0–34.0)
MCHC: 32.4 g/dL (ref 30.0–36.0)
MCV: 82 fL (ref 80.0–100.0)
Monocytes Absolute: 0.7 10*3/uL (ref 0.1–1.0)
Monocytes Relative: 8 %
Neutro Abs: 6 10*3/uL (ref 1.7–7.7)
Neutrophils Relative %: 67 %
Platelets: 327 10*3/uL (ref 150–400)
RBC: 4.1 MIL/uL (ref 3.87–5.11)
RDW: 13.8 % (ref 11.5–15.5)
WBC: 9 10*3/uL (ref 4.0–10.5)
nRBC: 0 % (ref 0.0–0.2)

## 2023-10-06 LAB — POCT URINALYSIS DIP (MANUAL ENTRY)
Bilirubin, UA: NEGATIVE
Glucose, UA: NEGATIVE mg/dL
Ketones, POC UA: NEGATIVE mg/dL
Leukocytes, UA: NEGATIVE
Nitrite, UA: NEGATIVE
Protein Ur, POC: NEGATIVE mg/dL
Spec Grav, UA: 1.015 (ref 1.010–1.025)
Urobilinogen, UA: 0.2 U/dL
pH, UA: 5.5 (ref 5.0–8.0)

## 2023-10-06 LAB — LIPASE, BLOOD: Lipase: 40 U/L (ref 11–51)

## 2023-10-06 LAB — WET PREP, GENITAL
Clue Cells Wet Prep HPF POC: NONE SEEN
Sperm: NONE SEEN
Trich, Wet Prep: NONE SEEN
WBC, Wet Prep HPF POC: <10 (ref <10)
Yeast Wet Prep HPF POC: NONE SEEN

## 2023-10-06 LAB — POCT URINE PREGNANCY: Preg Test, Ur: NEGATIVE

## 2023-10-06 MED ORDER — METHOCARBAMOL 500 MG PO TABS
500.0000 mg | ORAL_TABLET | Freq: Two times a day (BID) | ORAL | 0 refills | Status: DC
  Filled 2023-10-06: qty 20, 10d supply, fill #0

## 2023-10-06 MED ORDER — NAPROXEN 500 MG PO TABS
500.0000 mg | ORAL_TABLET | Freq: Two times a day (BID) | ORAL | 0 refills | Status: DC
  Filled 2023-10-06: qty 30, 15d supply, fill #0

## 2023-10-06 MED ORDER — HYDROCODONE-ACETAMINOPHEN 5-325 MG PO TABS
1.0000 | ORAL_TABLET | Freq: Once | ORAL | Status: AC
  Administered 2023-10-06: 1 via ORAL
  Filled 2023-10-06: qty 1

## 2023-10-06 MED ORDER — FLUCONAZOLE 150 MG PO TABS
150.0000 mg | ORAL_TABLET | Freq: Once | ORAL | Status: AC
  Administered 2023-10-06: 150 mg via ORAL
  Filled 2023-10-06: qty 1

## 2023-10-06 MED ORDER — LIDOCAINE 5 % EX PTCH
1.0000 | MEDICATED_PATCH | CUTANEOUS | 0 refills | Status: AC
  Filled 2023-10-06: qty 30, 30d supply, fill #0

## 2023-10-06 MED ORDER — IOHEXOL 300 MG/ML  SOLN
125.0000 mL | Freq: Once | INTRAMUSCULAR | Status: AC | PRN
  Administered 2023-10-06: 125 mL via INTRAVENOUS

## 2023-10-06 MED ORDER — KETOROLAC TROMETHAMINE 15 MG/ML IJ SOLN
15.0000 mg | Freq: Once | INTRAMUSCULAR | Status: AC
  Administered 2023-10-06: 15 mg via INTRAVENOUS
  Filled 2023-10-06: qty 1

## 2023-10-06 NOTE — ED Triage Notes (Signed)
 Pt present with worsening abdominal pain, vaginal irritation, itching x 6 days. States she started having vaginal discharge yesterday. C/o painful urination.

## 2023-10-06 NOTE — Discharge Instructions (Addendum)
 Please go to the ER for further evaluation of your symptoms

## 2023-10-06 NOTE — ED Notes (Signed)
 Patient is being discharged from the Urgent Care and sent to the Emergency Department via POV . Per Myla Bold, NP, patient is in need of higher level of care due to needing higher level of care. Patient is aware and verbalizes understanding of plan of care.  Vitals:   10/06/23 1452  Pulse: 100  Resp: 17  Temp: 99.3 F (37.4 C)  SpO2: 96%
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
## 2023-10-06 NOTE — ED Notes (Signed)
 Pt already obtained UA and Upreg from Urgent Care PTA.

## 2023-10-06 NOTE — Discharge Instructions (Addendum)
 Imaging and lab work looks good.  I have sent a medicine to your pharmacy please take months as prescribed.  Follow-up with OB/GYN for continued dysuria  Have sent medication for your back pain.  Robaxin is a muscle relaxer do not take when you are driving or operating heavy machinery.  Take naproxen  twice daily.  Use Lidoderm  patch on back.

## 2023-10-06 NOTE — ED Provider Notes (Signed)
 [facility] DE[facility] AT [facility] Provider Note   CSN: [id number] Arrival date & time: 10/06/23  1530     Patient presents with: Abdominal Pain and Vaginal Discharge   [facility] Stacie Mitchell is a 38 y.o. female.  With past medical history of obesity, diabetes presents to emergency room with complaint of abdominal pain.  Locates abdomen to suprapubic area this has been ongoing for approximately 6 days.  It is associated with vaginal itching, dysuria, urinary urgency and urinary frequency.  No fevers.  She is sexually active.  Unsure when her LMP was, states this is normal, cycle irregular.  No nausea vomiting diarrhea or decreased appetite.    Abdominal Pain Associated symptoms: vaginal discharge   Vaginal Discharge Associated symptoms: abdominal pain        Prior to Admission medications   Medication Sig Start Date End Date Taking? Authorizing Provider  cetirizine  (ZYRTEC ) 10 MG tablet Take 1 tablet (10 mg total) by mouth daily. 02/17/23   Newlin, Enobong, MD  cyclobenzaprine  (FLEXERIL ) 10 MG tablet Take 1 tablet (10 mg total) by mouth 2 (two) times daily as needed. 08/26/23   Ajewole, Christana, MD  diclofenac  Sodium (VOLTAREN ) 1 % GEL Apply 4 g topically 4 (four) times daily. 03/18/23   Newlin, Enobong, MD  Dulaglutide  (TRULICITY ) 0.75 MG/0.5ML SOAJ Inject 0.75 mg into the skin once a week. 09/08/23   Newlin, Enobong, MD  fluticasone  (FLONASE ) 50 MCG/ACT nasal spray Place 2 sprays into both nostrils daily. 02/17/23   Newlin, Enobong, MD  hydrochlorothiazide  (HYDRODIURIL ) 25 MG tablet Take 1 tablet (25 mg total) by mouth daily. 02/17/23   Newlin, Enobong, MD  hydrOXYzine  (ATARAX ) 25 MG tablet Take 1 tablet (25 mg total) by mouth 3 (three) times daily as needed. Patient not taking: Reported on 08/26/2023 05/20/22   Newlin, Enobong, MD  ibuprofen  (ADVIL ) 800 MG tablet Take 1 tablet (800 mg total) by mouth 2 (two) times daily as needed. 07/29/23   Newlin, Enobong, MD   metFORMIN  (GLUCOPHAGE ) 500 MG tablet Take 1 tablet (500 mg total) by mouth daily with breakfast. 02/17/23   Delbert Clam, MD  Misc. Devices MISC AutoPap 5 to 15 cm water.  Diagnosis obstructive sleep apnea. 11/21/22   Newlin, Enobong, MD  norethindrone  (AYGESTIN ) 5 MG tablet Take 2 tablets (10 mg total) by mouth daily. 08/26/23   Ajewole, Christana, MD  omeprazole  (PRILOSEC) 20 MG capsule Take 1 capsule (20 mg total) by mouth daily. 09/08/23   Fleming, Zelda W, NP  Vitamin D , Ergocalciferol , (DRISDOL ) 1.25 MG (50000 UNIT) CAPS capsule Take 1 capsule (50,000 Units total) by mouth every 7 (seven) days. 06/03/23   Newlin, Enobong, MD    Allergies: Patient has no known allergies.    Review of Systems  Gastrointestinal:  Positive for abdominal pain.  Genitourinary:  Positive for vaginal discharge.    Updated Vital Signs BP (!) 142/100 (BP Location: Left Wrist)   Pulse (!) 103   Temp 98.2 F (36.8 C)   Resp (!) 22   Ht 5' 2 (1.575 m)   Wt (!) 163.3 kg   LMP 09/23/2023 (Approximate)   SpO2 98%   BMI 65.84 kg/m   Physical Exam Vitals and nursing note reviewed.  Constitutional:      General: She is not in acute distress.    Appearance: She is obese. She is not toxic-appearing.  HENT:     Head: Normocephalic and atraumatic.   Eyes:     General: No  scleral icterus.    Conjunctiva/sclera: Conjunctivae normal.    Cardiovascular:     Rate and Rhythm: Normal rate and regular rhythm.     Pulses: Normal pulses.     Heart sounds: Normal heart sounds.  Pulmonary:     Effort: Pulmonary effort is normal. No respiratory distress.     Breath sounds: Normal breath sounds.  Abdominal:     General: Abdomen is flat. Bowel sounds are normal.     Palpations: Abdomen is soft.     Tenderness: There is abdominal tenderness.     Comments: Mild tenderness to palpation in suprapubic area.  No CVA tenderness.  Genitourinary:    Comments: Pelvic exam with chaperone.  No cervical motion  tenderness.  Skin:    General: Skin is warm and dry.     Findings: No lesion.   Neurological:     General: No focal deficit present.     Mental Status: She is alert and oriented to person, place, and time. Mental status is at baseline.     (all labs ordered are listed, but only abnormal results are displayed) Labs Reviewed  CBC WITH DIFFERENTIAL/PLATELET - Abnormal; Notable for the following components:      Result Value   Hemoglobin 10.9 (*)    HCT 33.6 (*)    All other components within normal limits  WET PREP, GENITAL  COMPREHENSIVE METABOLIC PANEL WITH GFR  LIPASE, BLOOD  GC/CHLAMYDIA PROBE AMP ([place]) NOT AT [facility]    EKG: None  Radiology: CT ABDOMEN PELVIS W CONTRAST Result Date: 10/06/2023 CLINICAL DATA:  Abdominal pain, back pain, vaginal itching and discharge. Burning with urination. EXAM: CT ABDOMEN AND PELVIS WITH CONTRAST TECHNIQUE: Multidetector CT imaging of the abdomen and pelvis was performed using the standard protocol following bolus administration of intravenous contrast. RADIATION DOSE REDUCTION: This exam was performed according to the departmental dose-optimization program which includes automated exposure control, adjustment of the mA and/or kV according to patient size and/or use of iterative reconstruction technique. CONTRAST:  OMNIPAQUE IOHEXOL 300 MG/ML  SOLN COMPARISON:  08/28/2020 FINDINGS: Lower chest: No acute abnormality Hepatobiliary: No focal hepatic abnormality. Gallbladder unremarkable. Pancreas: No focal abnormality or ductal dilatation. Spleen: No focal abnormality.  Normal size. Adrenals/Urinary Tract: No adrenal abnormality. No focal renal abnormality. No stones or hydronephrosis. Urinary bladder is unremarkable. Stomach/Bowel: Normal appendix. Stomach, large and small bowel grossly unremarkable. Vascular/Lymphatic: No evidence of aneurysm or adenopathy. Reproductive: Uterus and adnexa unremarkable.  No mass. Other: No free fluid or free  air. Musculoskeletal: No acute bony abnormality. IMPRESSION: No acute findings in the abdomen or pelvis. Electronically Signed   By: Franky Crease M.D.   On: 10/06/2023 18:50     Procedures   Medications Ordered in the ED  HYDROcodone -acetaminophen  (NORCO/VICODIN) 5-325 MG per tablet 1 tablet (1 tablet Oral Given 10/06/23 1646)                                    Medical Decision Making Amount and/or Complexity of Data Reviewed Labs: ordered. Radiology: ordered.  Risk Prescription drug management.   This patient presents to the ED for concern of dysuria, this involves an extensive number of treatment options, and is a complaint that carries with it a high risk of complications and morbidity.  The differential diagnosis includes STD, UTI, candidiasis, dehydration, electrolyte abnormality, diverticulitis, appendicitis   Co morbidities that complicate the patient evaluation  Obesity   Additional history obtained:  Additional history obtained from [facility] 10/06/23 seen for similar complaint, sent her for further workup since no sign of infection on UA.    Lab Tests:  I personally interpreted labs.  The pertinent results include:   No leukocytosis.  Negative wet prep.  Normal CMP.  Lipase normal.  Gonorrhea and chlamydia pending   Imaging Studies ordered:  I ordered imaging studies including CT abd/pelvis   I independently visualized and interpreted imaging which showed no acute findings I agree with the radiologist interpretation   Cardiac Monitoring: / EKG:  The patient was maintained on a cardiac monitor.    Problem List / ED Course / Critical interventions / Medication management  Patient reports to emergency room with suprapubic pain associated with vaginal itching and urinary frequency.  She had negative UA earlier today as well as negative wet prep.  She is not pregnant.  She notes this started after antibiotic use.  Clinically feel this is likely yeast infection.   Will treat with Diflucan .  She was sent here for CT scan.  CT scan is negative.  She is hemodynamically stable and well-appearing.  She is tolerating oral intake.  She has no fever.  Given recurrence of dysuria will refer to women's health. I ordered medication including Norco  Reevaluation of the patient after these medicines showed that the patient improved I have reviewed the patients home medicines and have made adjustments as needed   Plan F/u w/ PCP in 2-3d to ensure resolution of sx.  Patient was given return precautions. Patient stable for discharge at this time.  Patient educated on sx/dx and verbalized understanding of plan. Return to ER w/ new or worsening sx.       Final diagnoses:  Dysuria    ED Discharge Orders     None          Shermon Warren SAILOR, PA-C 10/06/23 1940    Ruthe Cornet, DO 10/06/23 2246

## 2023-10-06 NOTE — ED Notes (Signed)

## 2023-10-06 NOTE — ED Triage Notes (Signed)
 C/o lower abdominal/back pain, vaginal itching, discharge x 6 days. Burning with urination.

## 2023-10-07 ENCOUNTER — Other Ambulatory Visit: Payer: Self-pay

## 2023-10-07 LAB — GC/CHLAMYDIA PROBE AMP (~~LOC~~) NOT AT ARMC
Chlamydia: NEGATIVE
Comment: NEGATIVE
Comment: NORMAL
Neisseria Gonorrhea: NEGATIVE

## 2023-10-08 ENCOUNTER — Other Ambulatory Visit: Payer: Self-pay

## 2023-10-13 ENCOUNTER — Encounter: Payer: Self-pay | Admitting: Family Medicine

## 2023-10-13 ENCOUNTER — Other Ambulatory Visit: Payer: Self-pay

## 2023-10-13 ENCOUNTER — Other Ambulatory Visit (HOSPITAL_COMMUNITY)
Admission: RE | Admit: 2023-10-13 | Discharge: 2023-10-13 | Disposition: A | Discharge status: Home / Self Care | Payer: Self-pay | Source: Ambulatory Visit | Attending: Family Medicine | Admitting: Family Medicine

## 2023-10-13 ENCOUNTER — Ambulatory Visit: Payer: Self-pay | Attending: Family Medicine | Admitting: Family Medicine

## 2023-10-13 VITALS — BP 158/93 | HR 94 | Ht 62.0 in | Wt 379.4 lb

## 2023-10-13 DIAGNOSIS — N7689 Other specified inflammation of vagina and vulva: Secondary | ICD-10-CM

## 2023-10-13 DIAGNOSIS — G43809 Other migraine, not intractable, without status migrainosus: Secondary | ICD-10-CM

## 2023-10-13 DIAGNOSIS — R1030 Lower abdominal pain, unspecified: Secondary | ICD-10-CM

## 2023-10-13 DIAGNOSIS — N9489 Other specified conditions associated with female genital organs and menstrual cycle: Secondary | ICD-10-CM

## 2023-10-13 DIAGNOSIS — E66813 Obesity, class 3: Secondary | ICD-10-CM

## 2023-10-13 DIAGNOSIS — R7303 Prediabetes: Secondary | ICD-10-CM

## 2023-10-13 DIAGNOSIS — I10 Essential (primary) hypertension: Secondary | ICD-10-CM

## 2023-10-13 DIAGNOSIS — Z7985 Long-term (current) use of injectable non-insulin antidiabetic drugs: Secondary | ICD-10-CM

## 2023-10-13 DIAGNOSIS — Z7984 Long term (current) use of oral hypoglycemic drugs: Secondary | ICD-10-CM

## 2023-10-13 DIAGNOSIS — Z6841 Body Mass Index (BMI) 40.0 and over, adult: Secondary | ICD-10-CM

## 2023-10-13 LAB — POCT URINALYSIS DIP (CLINITEK)
Bilirubin, UA: NEGATIVE
Blood, UA: NEGATIVE
Glucose, UA: NEGATIVE mg/dL
Ketones, POC UA: NEGATIVE mg/dL
Leukocytes, UA: NEGATIVE
Nitrite, UA: NEGATIVE
POC PROTEIN,UA: NEGATIVE
Spec Grav, UA: 1.025 (ref 1.010–1.025)
Urobilinogen, UA: 0.2 U/dL
pH, UA: 5.5 (ref 5.0–8.0)

## 2023-10-13 LAB — POCT GLYCOSYLATED HEMOGLOBIN (HGB A1C): HbA1c, POC (prediabetic range): 5.7 % (ref 5.7–6.4)

## 2023-10-13 MED ORDER — HYDROCHLOROTHIAZIDE 25 MG PO TABS
25.0000 mg | ORAL_TABLET | Freq: Every day | ORAL | 1 refills | Status: AC
Start: 2023-10-13 — End: ?
  Filled 2023-10-13 – 2023-12-02 (×2): qty 90, 90d supply, fill #0
  Filled 2024-02-07 – 2024-03-05 (×2): qty 90, 90d supply, fill #1

## 2023-10-13 MED ORDER — RIZATRIPTAN BENZOATE 10 MG PO TABS
10.0000 mg | ORAL_TABLET | ORAL | 2 refills | Status: AC | PRN
Start: 2023-10-13 — End: ?
  Filled 2023-10-13: qty 10, 30d supply, fill #0

## 2023-10-13 MED ORDER — TRULICITY 1.5 MG/0.5ML ~~LOC~~ SOAJ
1.5000 mg | SUBCUTANEOUS | 6 refills | Status: DC
  Filled 2023-10-13: qty 2, 28d supply, fill #0
  Filled 2023-11-05: qty 2, 28d supply, fill #1
  Filled 2023-12-02: qty 2, 28d supply, fill #2
  Filled 2024-01-05: qty 2, 28d supply, fill #3
  Filled 2024-02-07: qty 2, 28d supply, fill #4
  Filled 2024-03-05: qty 2, 28d supply, fill #5
  Filled 2024-04-02 – 2024-04-12 (×3): qty 2, 28d supply, fill #6

## 2023-10-13 MED ORDER — METFORMIN HCL 500 MG PO TABS
500.0000 mg | ORAL_TABLET | Freq: Every day | ORAL | 1 refills | Status: DC
  Filled 2023-10-13 – 2023-11-05 (×2): qty 90, 90d supply, fill #0
  Filled 2024-01-05 – 2024-02-07 (×2): qty 90, 90d supply, fill #1
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
## 2023-10-13 NOTE — Patient Instructions (Signed)
Cefalea migraosa Migraine Headache Una cefalea migraosa es un dolor intenso pulsante o punzante en uno o ambos lados de la cabeza. Las cefaleas migraosas tambin pueden causar otros sntomas, como nuseas, vmitos y sensibilidad a la luz y el ruido. Una cefalea migraosa puede durar desde 4 horas [employer]. Hable con su mdico [employer] factores que pueden causar Animal nutritionist) las Soil scientist. Cules son las causas? La causa exacta se desconoce. Sin embargo, una migraa puede aparecer [employer] nervios del cerebro se irritan y liberan sustancias qumicas que hacen que los vasos sanguneos se inflamen. Esta inflamacin provoca dolor. Las causas o los desencadenantes de las migraas pueden ser los siguientes: Fumar. Medicamentos, por ejemplo: Nitroglicerina, que se [place] para tratar el dolor torcico. Pldoras anticonceptivas. Estrgeno. Ciertos medicamentos para la presin arterial. Los alimentos o las bebidas que contienen nitratos, glutamato, aspartamo, glutamato monosdico (GMS) o tiramina. Ciertos alimentos o bebidas, como quesos aejos, chocolate, alcohol o cafena. Hacer actividad fsica muy vigorosa. Otros factores desencadenantes pueden incluir los siguientes: Menstruacin. Embarazo. Hambre. Estrs. Dormir demasiado o muy poco. Cambios climticos. Cansancio (fatiga). Qu incrementa el riesgo? Los siguientes factores pueden hacer que usted sea ms propenso a tener cefaleas migraosas: Irven Easterly 25 y 98 aos. Ser mujer. Tener antecedentes familiares de cefalea migraosa. Ser de Engineer, manufacturing. Tener una afeccin de salud mental, como depresin o ansiedad. Ser obeso. Cules son los signos o sntomas? El principal sntoma de esta afeccin es el dolor pulsante o punzante. Este dolor puede [employer] siguientes caractersticas: Puede aparecer en cualquier regin de la cabeza, tanto de un lado como de [place]. Puede dificultar las actividades cotidianas. Puede  empeorar con la actividad fsica. Puede empeorar con las luces brillantes, los ruidos fuertes o los olores. Otros sntomas pueden incluir: Nuseas. Vmitos. Mareos. Antes de que comience una cefalea migraosa, usted puede recibir seales de advertencia (un aura). Un aura puede incluir: Ver luces intermitentes o tener puntos ciegos. Ver puntos brillantes, halos o lneas en zigzag. Tener una visin en tnel o visin borrosa. Sentir entumecimiento u hormigueo. Tener dificultad para hablar. Debilidad muscular. Despus de que la migraa finaliza, puede tener sntomas. Pueden incluir: Cansancio. Dificultad para concentrarse. Cmo se diagnostica? La cefalea migraosa se diagnostica en funcin de lo siguiente: Sus sntomas. Un examen fsico. Estudios, como, por ejemplo: Exploracin por tomografa computarizada (TC) o resonancia magntica (RM) de la cabeza. Estos estudios pueden ayudar a Teacher, early years/pre causas de cefalea. Una muestra de lquido de la columna vertebral (puncin lumbar) para analizarla (anlisis de lquido cefalorraqudeo o anlisis de LCR). Cmo se trata? Esta afeccin se puede tratar con medicamentos para: Engineer, materials y las nuseas. Prevenir las migraas. El tratamiento tambin puede incluir lo siguiente: Acupuntura. Cambios en el estilo de vida, como evitar los alimentos que provocan las cefaleas migraosas. Aprender Duaine Dredge de controlar el cuerpo (biorretroalimentacin). Psicoterapia para ayudarlo a Solicitor y lidiar con los pensamientos negativos (terapia cognitivo conductual). Siga estas instrucciones en su casa: Medicamentos Use los medicamentos de venta libre y los recetados solamente como se lo haya indicado el mdico. Pregntele al mdico si el medicamento recetado: Requiere que evite conducir o usar [place]. Puede causarle estreimiento. Es posible que tenga que tomar estas medidas para prevenir o tratar el estreimiento: Product manager suficiente lquido para  Radio producer pis (orina) de color amarillo plido. Usar medicamentos recetados o de Sales promotion account executive. Consumir alimentos ricos en fibra, como frijoles, cereales integrales, y frutas y verduras frescas. Limitar el consumo de alimentos ricos en grasa  y azcares procesados, como los alimentos fritos o dulces. Estilo de vida  No beba alcohol. No consuma ningn producto que contenga nicotina o tabaco. Estos productos incluyen cigarrillos, tabaco para Theatre manager y aparatos de vapeo, como los Administrator, Civil Service. Si necesita ayuda para dejar de fumar, consulte al mdico. Duerma entre 7 y 9 horas todas las noches o la cantidad de horas que le haya recomendado el mdico. Encuentre modos de [place] estrs, por ejemplo, a travs de la meditacin, la respiracin profunda o el yoga. Trate de realizar ejercicio a diario. Esto puede ayudar a [employer] gravedad y la frecuencia de las migraas. Instrucciones generales Mantenga un registro para Engineer, mining las migraas, con el fin de poder evitar esas cosas. Registre, por ejemplo, lo siguiente: Lo que usted come y bebe. El tiempo que duerme. Algn cambio en su dieta o en los medicamentos. Si tiene una cefalea migraosa: Evite los factores que [employer] sntomas, como las luces brillantes. Recustese en una habitacin oscura y tranquila. No conduzca ni opere maquinaria. Pregntele al mdico qu actividades son seguras para usted mientras tiene sntomas. Concurra a todas las visitas de seguimiento. El mdico controlar sus sntomas y Arts administrator tratamiento adicional si lo necesita. Dnde obtener ms informacin Coalition for Headache and Migraine Patients (CHAMP) (Coalicin para Pacientes con Cefaleas y Migraas): headachemigraine.org American Migraine Foundation (Fundacin Estadounidense para la Migraa): americanmigrainefoundation.org National Headache Foundation (Fundacin Nacional para la Cefalea): headaches.org Comunquese con un  mdico si: Tiene sntomas de cefalea migraosa que son distintos o peores que los habituales. Tiene ms de [address] de cefalea por mes. Solicite ayuda de inmediato si: La cefalea migraosa se pone muy intensa o dura ms de 72 horas. Tiene fiebre o rigidez en el cuello. Presenta prdida de la visin. Siente debilidad en los msculos o que no puede controlarlos. Pierde el equilibrio con frecuencia o tiene problemas para caminar. Se desmaya. Tiene una convulsin. Esta informacin no tiene Theme park manager el consejo del mdico. Asegrese de hacerle al mdico cualquier pregunta que tenga. Document Revised: 12/31/2021 Document Reviewed: 12/31/2021 Elsevier Patient Education  2024 [employer].

## 2023-10-14 ENCOUNTER — Other Ambulatory Visit: Payer: Self-pay | Admitting: Family Medicine

## 2023-10-14 ENCOUNTER — Other Ambulatory Visit: Payer: Self-pay

## 2023-10-14 DIAGNOSIS — G8929 Other chronic pain: Secondary | ICD-10-CM

## 2023-10-14 MED ORDER — IBUPROFEN 800 MG PO TABS
800.0000 mg | ORAL_TABLET | Freq: Two times a day (BID) | ORAL | 2 refills | Status: DC | PRN
  Filled 2023-10-14 – 2023-11-05 (×2): qty 30, 15d supply, fill #0
  Filled 2023-12-02: qty 30, 15d supply, fill #1
  Filled 2024-01-05: qty 30, 15d supply, fill #2

## 2023-10-15 ENCOUNTER — Other Ambulatory Visit: Payer: Self-pay

## 2023-10-15 ENCOUNTER — Ambulatory Visit: Payer: Self-pay | Admitting: Family Medicine

## 2023-10-15 LAB — CERVICOVAGINAL ANCILLARY ONLY
Bacterial Vaginitis (gardnerella): NEGATIVE
Candida Glabrata: NEGATIVE
Candida Vaginitis: NEGATIVE
Chlamydia: NEGATIVE
Comment: NEGATIVE
Comment: NEGATIVE
Comment: NEGATIVE
Comment: NEGATIVE
Comment: NEGATIVE
Comment: NORMAL
Neisseria Gonorrhea: NEGATIVE
Trichomonas: NEGATIVE

## 2023-10-15 MED ORDER — CLOTRIMAZOLE 1 % EX CREA
1.0000 | TOPICAL_CREAM | Freq: Two times a day (BID) | CUTANEOUS | 0 refills | Status: DC
  Filled 2023-10-15 – 2023-11-05 (×2): qty 30, 15d supply, fill #0

## 2023-10-21 ENCOUNTER — Other Ambulatory Visit: Payer: Self-pay

## 2023-10-28 ENCOUNTER — Other Ambulatory Visit: Payer: Self-pay

## 2023-11-06 ENCOUNTER — Other Ambulatory Visit: Payer: Self-pay

## 2023-11-10 ENCOUNTER — Other Ambulatory Visit: Payer: Self-pay

## 2023-11-19 ENCOUNTER — Ambulatory Visit: Admitting: Obstetrics and Gynecology

## 2023-12-02 ENCOUNTER — Other Ambulatory Visit: Payer: Self-pay | Admitting: Family Medicine

## 2023-12-03 ENCOUNTER — Other Ambulatory Visit: Payer: Self-pay

## 2023-12-04 ENCOUNTER — Other Ambulatory Visit: Payer: Self-pay

## 2024-01-05 ENCOUNTER — Other Ambulatory Visit: Payer: Self-pay | Admitting: Family Medicine

## 2024-01-06 ENCOUNTER — Ambulatory Visit: Payer: Self-pay

## 2024-01-06 ENCOUNTER — Other Ambulatory Visit: Payer: Self-pay

## 2024-01-06 MED ORDER — CLOTRIMAZOLE 1 % EX CREA
1.0000 | TOPICAL_CREAM | Freq: Two times a day (BID) | CUTANEOUS | 0 refills | Status: DC
  Filled 2024-01-06: qty 28, 14d supply, fill #0
  Filled 2024-01-06: qty 30, 15d supply, fill #0

## 2024-01-06 NOTE — Telephone Encounter (Signed)
 FYI Only or Action Required?: FYI only for provider.  Patient was last seen in primary care on 10/13/2023 by Newlin, Enobong, MD.  Called Nurse Triage reporting Hyperglycemia.  Symptoms began 2 weeks ago.  Symptoms are: gradually worsening.  Triage Disposition: Call PCP Now  Patient/caregiver understands and will follow disposition?: Unsure       Copied from CRM #[id number]. Topic: Clinical - Red Word Triage >> Jan 06, 2024  2:56 PM Dedra B wrote: [facility] that prompted transfer to Nurse Triage: Pt experiencing back pain, itchy rash near vagina, painful intercourse, and glucose readings of 400. Warm triage to NT.       Reason for Disposition  Blood glucose > 400 mg/dL (77.7 mmol/L)  Answer Assessment - Initial Assessment Questions Patient seen at a women's clinic today and was told her vaginal symptoms are probably due to her blood sugar. No appointments available until 10/9. Patient advised to go to urgent care or the ED for treatment, at which time she ended the call.      1. BLOOD GLUCOSE: What is your blood glucose level?      400 2. ONSET: When did you check the blood glucose?     A couple of weeks 4. KETONES: Do you check for ketones (urine or blood test strips)? If Yes, ask: What does the test show now?      No 7. DIABETES PILLS: Do you take any pills for your diabetes? If Yes, ask: Have you missed taking any pills recently?     Yes 8. OTHER SYMPTOMS: Do you have any symptoms? (e.g., fever, frequent urination, difficulty breathing, dizziness, weakness, vomiting)     Rash near vagina and painful intercourse, back pain  Protocols used: Diabetes - High Blood Sugar-A-AH

## 2024-01-06 NOTE — Telephone Encounter (Signed)
 Does patient need to come in office?

## 2024-01-06 NOTE — Telephone Encounter (Signed)
 Call to patient. Offered patient appointment today, patient voiced that she unable to come in the best she has other obligations. She will be available next Thursday. No open appointment advised MU or if s/s worsen UC

## 2024-01-11 ENCOUNTER — Other Ambulatory Visit: Payer: Self-pay

## 2024-01-12 ENCOUNTER — Other Ambulatory Visit: Payer: Self-pay

## 2024-01-13 ENCOUNTER — Other Ambulatory Visit: Payer: Self-pay

## 2024-01-14 ENCOUNTER — Other Ambulatory Visit: Payer: Self-pay

## 2024-02-07 ENCOUNTER — Other Ambulatory Visit: Payer: Self-pay | Admitting: Family Medicine

## 2024-02-07 ENCOUNTER — Other Ambulatory Visit: Payer: Self-pay

## 2024-02-07 DIAGNOSIS — G8929 Other chronic pain: Secondary | ICD-10-CM

## 2024-02-07 DIAGNOSIS — J302 Other seasonal allergic rhinitis: Secondary | ICD-10-CM

## 2024-02-08 ENCOUNTER — Other Ambulatory Visit: Payer: Self-pay

## 2024-02-09 ENCOUNTER — Other Ambulatory Visit: Payer: Self-pay

## 2024-02-09 MED ORDER — IBUPROFEN 800 MG PO TABS
800.0000 mg | ORAL_TABLET | Freq: Two times a day (BID) | ORAL | 0 refills | Status: DC | PRN
  Filled 2024-02-09: qty 60, 30d supply, fill #0

## 2024-02-09 MED ORDER — FLUTICASONE PROPIONATE 50 MCG/ACT NA SUSP
2.0000 | Freq: Every day | NASAL | 0 refills | Status: AC
Start: 2024-02-09 — End: ?
  Filled 2024-02-09: qty 16, 30d supply, fill #0

## 2024-02-09 MED ORDER — CLOTRIMAZOLE 1 % EX CREA
1.0000 | TOPICAL_CREAM | Freq: Two times a day (BID) | CUTANEOUS | 0 refills | Status: AC
  Filled 2024-02-09: qty 45, 23d supply, fill #0
  Filled 2024-02-09: qty 28, 14d supply, fill #0
  Filled 2024-04-02: qty 45, 23d supply, fill #0

## 2024-03-05 ENCOUNTER — Other Ambulatory Visit: Payer: Self-pay | Admitting: Family Medicine

## 2024-03-05 DIAGNOSIS — G8929 Other chronic pain: Secondary | ICD-10-CM

## 2024-03-07 ENCOUNTER — Other Ambulatory Visit: Payer: Self-pay

## 2024-03-15 ENCOUNTER — Other Ambulatory Visit: Payer: Self-pay

## 2024-03-15 ENCOUNTER — Telehealth: Payer: Self-pay

## 2024-03-15 ENCOUNTER — Ambulatory Visit: Payer: Self-pay

## 2024-03-15 ENCOUNTER — Other Ambulatory Visit: Payer: Self-pay | Admitting: Family Medicine

## 2024-03-15 DIAGNOSIS — G8929 Other chronic pain: Secondary | ICD-10-CM

## 2024-03-15 DIAGNOSIS — R7303 Prediabetes: Secondary | ICD-10-CM

## 2024-03-15 NOTE — Telephone Encounter (Signed)
Patient requesting refill on Ibuprofen

## 2024-03-15 NOTE — Telephone Encounter (Signed)
  This RN called an interpreter [employer] [phone]  FYI Only or Action Required?: Action required by provider: clinical question for provider, update on patient condition, and patient refused ER at this time and wants a refill on ibuprofen .  Patient was last seen in primary care on 10/13/2023 by Delbert Clam, MD.  Called Nurse Triage reporting Pain.  Symptoms began 1-2 months.  Interventions attempted: Rest, hydration, or home remedies.  Symptoms are: gradually worsening.  Triage Disposition: See HCP Within 4 Hours (Or PCP Triage)  Patient/caregiver understands and will follow disposition?: No, wishes to speak with PCP               Copied from CRM #[id number]. Topic: Clinical - Red Word Triage >> Mar 15, 2024  3:41 PM Rosaria BRAVO wrote: Red Word that prompted transfer to Nurse Triage: Severe neck pain, seeking appt with PCP. Reason for Disposition . [1] Thigh, calf, or ankle swelling AND [2] bilateral AND [3] 1 side is more swollen  Answer Assessment - Initial Assessment Questions This RN called an interpreter [employer] [phone]  Both Knee(more the left one) and head and back are hurting and patient called pharmacy for a refill of ibuprofen  but they didn't have anymore refills X 1-2 months Patient states nothing new and she is also getting a lot of cramps in her calves Left calf pain x 1 week--denies or warmth but it feels hard--patient does endorse swelling in this calf muscle--- Patient states pain is ten out of ten   Patient is advised that the recommendation for lower leg swelling with one worse than the other, with severe pain and constant pain in her left calf muscle it is recommended that she is evaluated within the next few hours and with no openings at her PCP office or the surrounding offices today--it is recommended that she goes to the ER for evaluation of the calf pain/swelling  Patient states that she cannot go to the  Emergency Room and states she cannot wait long for something for her pain and wanted a prescription of ibuprofen .  She also states that she cannot miss work because she doesn't want to be fired. Patient disconnected after this.  Protocols used: Leg Pain-A-AH

## 2024-03-15 NOTE — Telephone Encounter (Signed)
 Called [facility] and advised Zara of patient's symptoms and refusing the ER at this time

## 2024-03-15 NOTE — Telephone Encounter (Signed)
 error

## 2024-03-15 NOTE — Telephone Encounter (Signed)
 Spoke with patient advised that she should use caution when taking ibuprofen  due to some of her blood work list in her chart. Advise she can take tylenol . Patient voiced that appointment she has is good for her .

## 2024-04-02 ENCOUNTER — Other Ambulatory Visit: Payer: Self-pay | Admitting: Family Medicine

## 2024-04-02 DIAGNOSIS — G8929 Other chronic pain: Secondary | ICD-10-CM

## 2024-04-04 ENCOUNTER — Other Ambulatory Visit: Payer: Self-pay

## 2024-04-04 MED FILL — Ibuprofen Tab 800 MG: 800.0000 mg | ORAL | 30 days supply | Qty: 60 | Fill #0 | Status: AC

## 2024-04-05 ENCOUNTER — Other Ambulatory Visit: Payer: Self-pay

## 2024-04-12 ENCOUNTER — Other Ambulatory Visit: Payer: Self-pay

## 2024-04-12 ENCOUNTER — Other Ambulatory Visit: Payer: Self-pay | Admitting: Family Medicine

## 2024-04-12 ENCOUNTER — Ambulatory Visit: Payer: Self-pay | Admitting: Nurse Practitioner

## 2024-04-12 VITALS — BP 120/80 | HR 72 | Temp 97.1°F | Wt 376.0 lb

## 2024-04-12 DIAGNOSIS — F4322 Adjustment disorder with anxiety: Secondary | ICD-10-CM

## 2024-04-12 DIAGNOSIS — F322 Major depressive disorder, single episode, severe without psychotic features: Secondary | ICD-10-CM

## 2024-04-12 DIAGNOSIS — M545 Low back pain, unspecified: Secondary | ICD-10-CM

## 2024-04-12 DIAGNOSIS — G8929 Other chronic pain: Secondary | ICD-10-CM

## 2024-04-12 DIAGNOSIS — M25561 Pain in right knee: Secondary | ICD-10-CM

## 2024-04-12 DIAGNOSIS — R7303 Prediabetes: Secondary | ICD-10-CM

## 2024-04-12 DIAGNOSIS — M25562 Pain in left knee: Secondary | ICD-10-CM

## 2024-04-12 DIAGNOSIS — Z6841 Body Mass Index (BMI) 40.0 and over, adult: Secondary | ICD-10-CM

## 2024-04-12 DIAGNOSIS — J302 Other seasonal allergic rhinitis: Secondary | ICD-10-CM

## 2024-04-12 LAB — POCT GLYCOSYLATED HEMOGLOBIN (HGB A1C): Hemoglobin A1C: 5.9 % — AB (ref 4.0–5.6)

## 2024-04-12 MED ORDER — METHYLPREDNISOLONE 4 MG PO TBPK
ORAL_TABLET | ORAL | 0 refills | Status: AC
  Filled 2024-04-12: qty 21, 6d supply, fill #0

## 2024-04-12 MED ORDER — CETIRIZINE HCL 10 MG PO TABS
10.0000 mg | ORAL_TABLET | Freq: Every day | ORAL | 0 refills | Status: AC
  Filled 2024-04-12: qty 30, 30d supply, fill #0

## 2024-04-12 MED ORDER — CYCLOBENZAPRINE HCL 10 MG PO TABS
10.0000 mg | ORAL_TABLET | Freq: Two times a day (BID) | ORAL | 0 refills | Status: AC | PRN
  Filled 2024-04-12: qty 20, 10d supply, fill #0

## 2024-04-12 NOTE — Patient Instructions (Addendum)
" °  Tome la prednisona segn las instrucciones del envase. No tome Aleve  ni ibuprofeno mientras est tomando prednisona. Tome el medicamento con alimentos. Puede tomar Tylenol  650 mg cada 6 horas segn sea necesario para el dolor y ciclobenzaprina 10 mg dos veces al da segn sea necesario para los espasmos musculares.  Tambin le recomendamos realizar ejercicios de estiramiento y usar una almohadilla trmica.   Es importante que haga ejercicio regularmente, al menos 30 minutos 5 veces por semana, segn su tolerancia. Piense en lo que va a comer y planifique con anticipacin. Elija alimentos limpios, verdes, frescos o congelados en lugar de alimentos enlatados, procesados o envasados, que son ms azucarados, salados y scientist, physiological. Entre el 70 y el 75 % de los alimentos consumidos deben ser verduras y frutas. Tres comidas a horarios fijos, con refrigerios permitidos entre comidas, pero deben ser frutas o verduras. Intente comer durante un perodo de 12 horas, por ejemplo, de 7 a. m. a 7 p. m., y DETNGASE despus de su ltima comida del da. Stacie Mitchell, generalmente alrededor de 64 onzas al da; ninguna otra bebida es tan saludable. El jugo de fruta se disfruta mejor de forma saludable, COMIENDO la fruta.  Stacie Mitchell elegir [facility]; consideramos un privilegio servirle.    Please take prednisone  as instructed on the packaging.  Do not take Aleve  or ibuprofen  when taking prednisone .  Take medication with food.  Okay to take Tylenol  650 mg every 6 hours as needed for pain, take cyclobenzaprine  10 mg twice daily as needed muscle spasm  Also encourage stretching exercises and use of heating pad  It is important that you exercise regularly at least 30 minutes 5 times a week as tolerated  Think about what you will eat, plan ahead. Choose  clean, green, fresh or frozen over canned, processed or packaged foods which are more sugary, salty and fatty. 70 to 75% of food eaten should be  vegetables and fruit. Three meals at set times with snacks allowed between meals, but they must be fruit or vegetables. Aim to eat over a 12 hour period , example 7 am to 7 pm, and STOP after  your last meal of the day. Drink water,generally about 64 ounces per day, no other drink is as healthy. Fruit juice is best enjoyed in a healthy way, by EATING the fruit.  Thanks for choosing [facility] we consider it a privelige to serve you.  "

## 2024-04-12 NOTE — Assessment & Plan Note (Signed)
 Wt Readings from Last 3 Encounters:  04/12/24 (!) 376 lb (170.6 kg)  10/13/23 (!) 379 lb 6.4 oz (172.1 kg)  10/06/23 (!) 360 lb (163.3 kg)   Body mass index is 68.77 kg/m.  Patient counseled on low-carb diet Encouraged moderate to vigorous exercise at least 150 minutes weekly as tolerated

## 2024-04-12 NOTE — Assessment & Plan Note (Addendum)
" °  Chronic severe pain in knees and back due to degenerative changes previous x-ray confirmed arthritis. Ibuprofen  ineffective. Prednisone  considered for short-term use. Steroid and Toradol  injections declined. Emphasized weight loss and exercise. - Prescribed prednisone  for a few days with food. - Advised against ibuprofen  while on prednisone . - Recommended Tylenol  650 mg every six hours as needed. - Refilled Flexeril  10 mg twice daily as needed. - Referred to orthopedic specialist if no improvement  - Advised use of knee brace and heating pad for pain relief. Loose weight  "

## 2024-04-12 NOTE — Assessment & Plan Note (Signed)
 Osteoarthritis of right knee Chronic severe pain in knees and back due to osteoarthritis. Previous x-ray confirmed arthritis. Ibuprofen  ineffective. Prednisone  considered for short-term use. Steroid and Toradol  injections declined. Emphasized weight loss and exercise. - Prescribed prednisone  for a few days with food. - Advised against ibuprofen  while on prednisone . - Recommended Tylenol  650 mg every six hours as needed. - Refilled Flexeril  10 mg twice daily as needed. - Referred to orthopedic specialist if no improvement  - Advised use of knee brace and heating pad for pain relief. Loose weight

## 2024-04-12 NOTE — Assessment & Plan Note (Signed)
" °    04/12/2024    2:14 PM 08/26/2023    4:25 PM 06/25/2023    9:22 AM 06/03/2023   11:42 AM  GAD 7 : Generalized Anxiety Score  Nervous, Anxious, on Edge 1 2 1 1   Control/stop worrying 3 2 2 1   Worry too much - different things 3 2 2 1   Trouble relaxing 3 2 2 1   Restless 0 2 1 1   Easily annoyed or irritable 1 2 3 2   Afraid - awful might happen 3 2 2 2   Total GAD 7 Score 14 14 13 9   Anxiety Difficulty Somewhat difficult     Did not get to address this today  follow-up with PCP in 4 weeks or sooner   "
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
## 2024-04-12 NOTE — Assessment & Plan Note (Signed)
 Lab Results  Component Value Date   HGBA1C 5.9 (A) 04/12/2024  Continue Trulicity  1.5 mg weekly, metformin  500 mg daily Counseled on low-carb diet Lose weight

## 2024-04-12 NOTE — Assessment & Plan Note (Signed)
 Flowsheet Row Office Visit from 04/12/2024 in [facility] Office Visit from 08/26/2023 in [facility] Procedure visit from 06/25/2023 in [facility]  Thoughts that you would be better off dead, or of hurting yourself in some way Not at all More than half the days Several days  PHQ-9 Total Score 21 18 9    Did not get to address this today  follow-up with PCP in 4 weeks or sooner

## 2024-04-13 ENCOUNTER — Other Ambulatory Visit: Payer: Self-pay

## 2024-04-22 ENCOUNTER — Other Ambulatory Visit: Payer: Self-pay

## 2024-05-10 ENCOUNTER — Ambulatory Visit: Payer: Self-pay | Admitting: Family Medicine

## 2024-05-10 ENCOUNTER — Other Ambulatory Visit: Payer: Self-pay | Admitting: Family Medicine

## 2024-05-10 DIAGNOSIS — R7303 Prediabetes: Secondary | ICD-10-CM

## 2024-05-11 ENCOUNTER — Other Ambulatory Visit: Payer: Self-pay

## 2024-05-11 MED ORDER — TRULICITY 1.5 MG/0.5ML ~~LOC~~ SOAJ
1.5000 mg | SUBCUTANEOUS | 0 refills | Status: AC
  Filled 2024-05-11: qty 2, 28d supply, fill #0

## 2024-05-11 MED ORDER — METFORMIN HCL 500 MG PO TABS
500.0000 mg | ORAL_TABLET | Freq: Every day | ORAL | 0 refills | Status: AC
  Filled 2024-05-11: qty 30, 30d supply, fill #0

## 2024-05-31 ENCOUNTER — Ambulatory Visit: Admitting: Nurse Practitioner
